# Patient Record
Sex: Female | Born: 1975 | Race: White | Hispanic: No | Marital: Single
Health system: Midwestern US, Community
[De-identification: ages and names within clinical notes are randomized; demographics above are authoritative.]

## PROBLEM LIST (undated history)

## (undated) DIAGNOSIS — E1143 Type 2 diabetes mellitus with diabetic autonomic (poly)neuropathy: Principal | ICD-10-CM

## (undated) DIAGNOSIS — E119 Type 2 diabetes mellitus without complications: Secondary | ICD-10-CM

## (undated) DIAGNOSIS — J45909 Unspecified asthma, uncomplicated: Secondary | ICD-10-CM

## (undated) DIAGNOSIS — F32A Depression, unspecified: Secondary | ICD-10-CM

## (undated) DIAGNOSIS — E669 Obesity, unspecified: Secondary | ICD-10-CM

## (undated) DIAGNOSIS — F329 Major depressive disorder, single episode, unspecified: Secondary | ICD-10-CM

## (undated) HISTORY — PX: BREAST SURGERY: SHX581

## (undated) MED FILL — METFORMIN HCL 1000MG TABS: 1000 MG | 30 days supply | Qty: 60 | Fill #0 | Status: AC

## (undated) MED FILL — AMLODIPINE BESYLATE 5MG TABS: 5 MG | 30 days supply | Qty: 30 | Fill #0 | Status: AC

---

## 2006-02-22 ENCOUNTER — Emergency Department: Payer: Self-pay | Admitting: General Practice

## 2006-03-27 ENCOUNTER — Emergency Department (HOSPITAL_COMMUNITY): Admission: EM | Admit: 2006-03-27 | Discharge: 2006-03-27 | Payer: Self-pay | Admitting: Emergency Medicine

## 2006-05-18 ENCOUNTER — Emergency Department: Payer: Self-pay | Admitting: Emergency Medicine

## 2006-05-18 ENCOUNTER — Other Ambulatory Visit: Payer: Self-pay

## 2006-05-20 ENCOUNTER — Emergency Department: Payer: Self-pay | Admitting: Emergency Medicine

## 2006-06-11 ENCOUNTER — Emergency Department: Payer: Self-pay | Admitting: Emergency Medicine

## 2006-07-29 ENCOUNTER — Emergency Department: Payer: Self-pay | Admitting: Emergency Medicine

## 2006-07-29 ENCOUNTER — Other Ambulatory Visit: Payer: Self-pay

## 2007-02-13 ENCOUNTER — Emergency Department: Payer: Self-pay | Admitting: Internal Medicine

## 2007-02-26 ENCOUNTER — Emergency Department: Payer: Self-pay | Admitting: Emergency Medicine

## 2007-03-06 ENCOUNTER — Emergency Department: Payer: Self-pay | Admitting: Emergency Medicine

## 2007-03-07 ENCOUNTER — Emergency Department (HOSPITAL_COMMUNITY): Admission: EM | Admit: 2007-03-07 | Discharge: 2007-03-07 | Payer: Self-pay | Admitting: Emergency Medicine

## 2007-03-17 ENCOUNTER — Emergency Department: Payer: Self-pay | Admitting: Emergency Medicine

## 2007-03-23 ENCOUNTER — Emergency Department: Payer: Self-pay | Admitting: Emergency Medicine

## 2007-06-02 ENCOUNTER — Emergency Department (HOSPITAL_COMMUNITY): Admission: EM | Admit: 2007-06-02 | Discharge: 2007-06-02 | Payer: Self-pay | Admitting: Family Medicine

## 2007-06-16 ENCOUNTER — Emergency Department (HOSPITAL_COMMUNITY): Admission: EM | Admit: 2007-06-16 | Discharge: 2007-06-16 | Payer: Self-pay | Admitting: Emergency Medicine

## 2007-06-23 ENCOUNTER — Emergency Department (HOSPITAL_COMMUNITY): Admission: EM | Admit: 2007-06-23 | Discharge: 2007-06-23 | Payer: Self-pay | Admitting: Emergency Medicine

## 2007-08-31 ENCOUNTER — Emergency Department: Payer: Self-pay | Admitting: Emergency Medicine

## 2007-09-24 ENCOUNTER — Emergency Department (HOSPITAL_COMMUNITY): Admission: EM | Admit: 2007-09-24 | Discharge: 2007-09-24 | Payer: Self-pay | Admitting: Emergency Medicine

## 2007-09-30 ENCOUNTER — Inpatient Hospital Stay (HOSPITAL_COMMUNITY): Admission: AD | Admit: 2007-09-30 | Discharge: 2007-09-30 | Payer: Self-pay | Admitting: Obstetrics & Gynecology

## 2007-10-07 ENCOUNTER — Emergency Department (HOSPITAL_COMMUNITY): Admission: EM | Admit: 2007-10-07 | Discharge: 2007-10-07 | Payer: Self-pay | Admitting: Emergency Medicine

## 2007-10-12 ENCOUNTER — Emergency Department (HOSPITAL_COMMUNITY): Admission: EM | Admit: 2007-10-12 | Discharge: 2007-10-12 | Payer: Self-pay | Admitting: Emergency Medicine

## 2007-10-14 ENCOUNTER — Emergency Department (HOSPITAL_COMMUNITY): Admission: EM | Admit: 2007-10-14 | Discharge: 2007-10-15 | Payer: Self-pay | Admitting: Emergency Medicine

## 2007-10-17 ENCOUNTER — Inpatient Hospital Stay (HOSPITAL_COMMUNITY): Admission: EM | Admit: 2007-10-17 | Discharge: 2007-10-22 | Payer: Self-pay | Admitting: Emergency Medicine

## 2007-10-23 ENCOUNTER — Emergency Department (HOSPITAL_COMMUNITY): Admission: EM | Admit: 2007-10-23 | Discharge: 2007-10-23 | Payer: Self-pay | Admitting: Emergency Medicine

## 2007-10-27 ENCOUNTER — Emergency Department (HOSPITAL_COMMUNITY): Admission: EM | Admit: 2007-10-27 | Discharge: 2007-10-27 | Payer: Self-pay | Admitting: Emergency Medicine

## 2007-11-12 ENCOUNTER — Emergency Department (HOSPITAL_COMMUNITY): Admission: EM | Admit: 2007-11-12 | Discharge: 2007-11-12 | Payer: Self-pay | Admitting: Emergency Medicine

## 2007-11-17 ENCOUNTER — Emergency Department (HOSPITAL_COMMUNITY): Admission: EM | Admit: 2007-11-17 | Discharge: 2007-11-17 | Payer: Self-pay | Admitting: Emergency Medicine

## 2007-11-20 ENCOUNTER — Emergency Department (HOSPITAL_COMMUNITY): Admission: EM | Admit: 2007-11-20 | Discharge: 2007-11-20 | Payer: Self-pay | Admitting: Emergency Medicine

## 2007-11-23 ENCOUNTER — Emergency Department (HOSPITAL_COMMUNITY): Admission: EM | Admit: 2007-11-23 | Discharge: 2007-11-23 | Payer: Self-pay | Admitting: Emergency Medicine

## 2007-11-30 ENCOUNTER — Emergency Department (HOSPITAL_COMMUNITY): Admission: EM | Admit: 2007-11-30 | Discharge: 2007-11-30 | Payer: Self-pay | Admitting: Emergency Medicine

## 2007-12-06 ENCOUNTER — Emergency Department (HOSPITAL_COMMUNITY): Admission: EM | Admit: 2007-12-06 | Discharge: 2007-12-06 | Payer: Self-pay | Admitting: Emergency Medicine

## 2007-12-15 ENCOUNTER — Emergency Department (HOSPITAL_COMMUNITY): Admission: EM | Admit: 2007-12-15 | Discharge: 2007-12-15 | Payer: Self-pay | Admitting: Emergency Medicine

## 2007-12-21 ENCOUNTER — Emergency Department (HOSPITAL_COMMUNITY): Admission: EM | Admit: 2007-12-21 | Discharge: 2007-12-21 | Payer: Self-pay | Admitting: Emergency Medicine

## 2007-12-29 ENCOUNTER — Emergency Department (HOSPITAL_COMMUNITY): Admission: EM | Admit: 2007-12-29 | Discharge: 2007-12-29 | Payer: Self-pay | Admitting: Emergency Medicine

## 2008-01-20 ENCOUNTER — Emergency Department (HOSPITAL_COMMUNITY): Admission: EM | Admit: 2008-01-20 | Discharge: 2008-01-20 | Payer: Self-pay | Admitting: Emergency Medicine

## 2008-02-06 ENCOUNTER — Emergency Department (HOSPITAL_COMMUNITY): Admission: EM | Admit: 2008-02-06 | Discharge: 2008-02-06 | Payer: Self-pay | Admitting: Emergency Medicine

## 2008-02-08 ENCOUNTER — Emergency Department (HOSPITAL_COMMUNITY): Admission: EM | Admit: 2008-02-08 | Discharge: 2008-02-09 | Payer: Self-pay | Admitting: Emergency Medicine

## 2008-06-14 ENCOUNTER — Emergency Department (HOSPITAL_COMMUNITY): Admission: EM | Admit: 2008-06-14 | Discharge: 2008-06-14 | Payer: Self-pay | Admitting: Emergency Medicine

## 2008-06-21 ENCOUNTER — Emergency Department (HOSPITAL_COMMUNITY): Admission: EM | Admit: 2008-06-21 | Discharge: 2008-06-21 | Payer: Self-pay | Admitting: Emergency Medicine

## 2008-08-07 ENCOUNTER — Emergency Department (HOSPITAL_COMMUNITY): Admission: EM | Admit: 2008-08-07 | Discharge: 2008-08-07 | Payer: Self-pay | Admitting: Emergency Medicine

## 2008-09-06 ENCOUNTER — Ambulatory Visit: Payer: Self-pay | Admitting: Psychiatry

## 2008-09-06 ENCOUNTER — Inpatient Hospital Stay (HOSPITAL_COMMUNITY): Admission: RE | Admit: 2008-09-06 | Discharge: 2008-09-11 | Payer: Self-pay | Admitting: Psychiatry

## 2009-02-07 IMAGING — CR DG CHEST 2V
2 series · 2 of 2 positions shown · non-contrast
Comparison: 06/16/07.

CLINICAL DATA: Weakness after donating plasma. 
 CHEST - 2 VIEW:

[w chest pa]
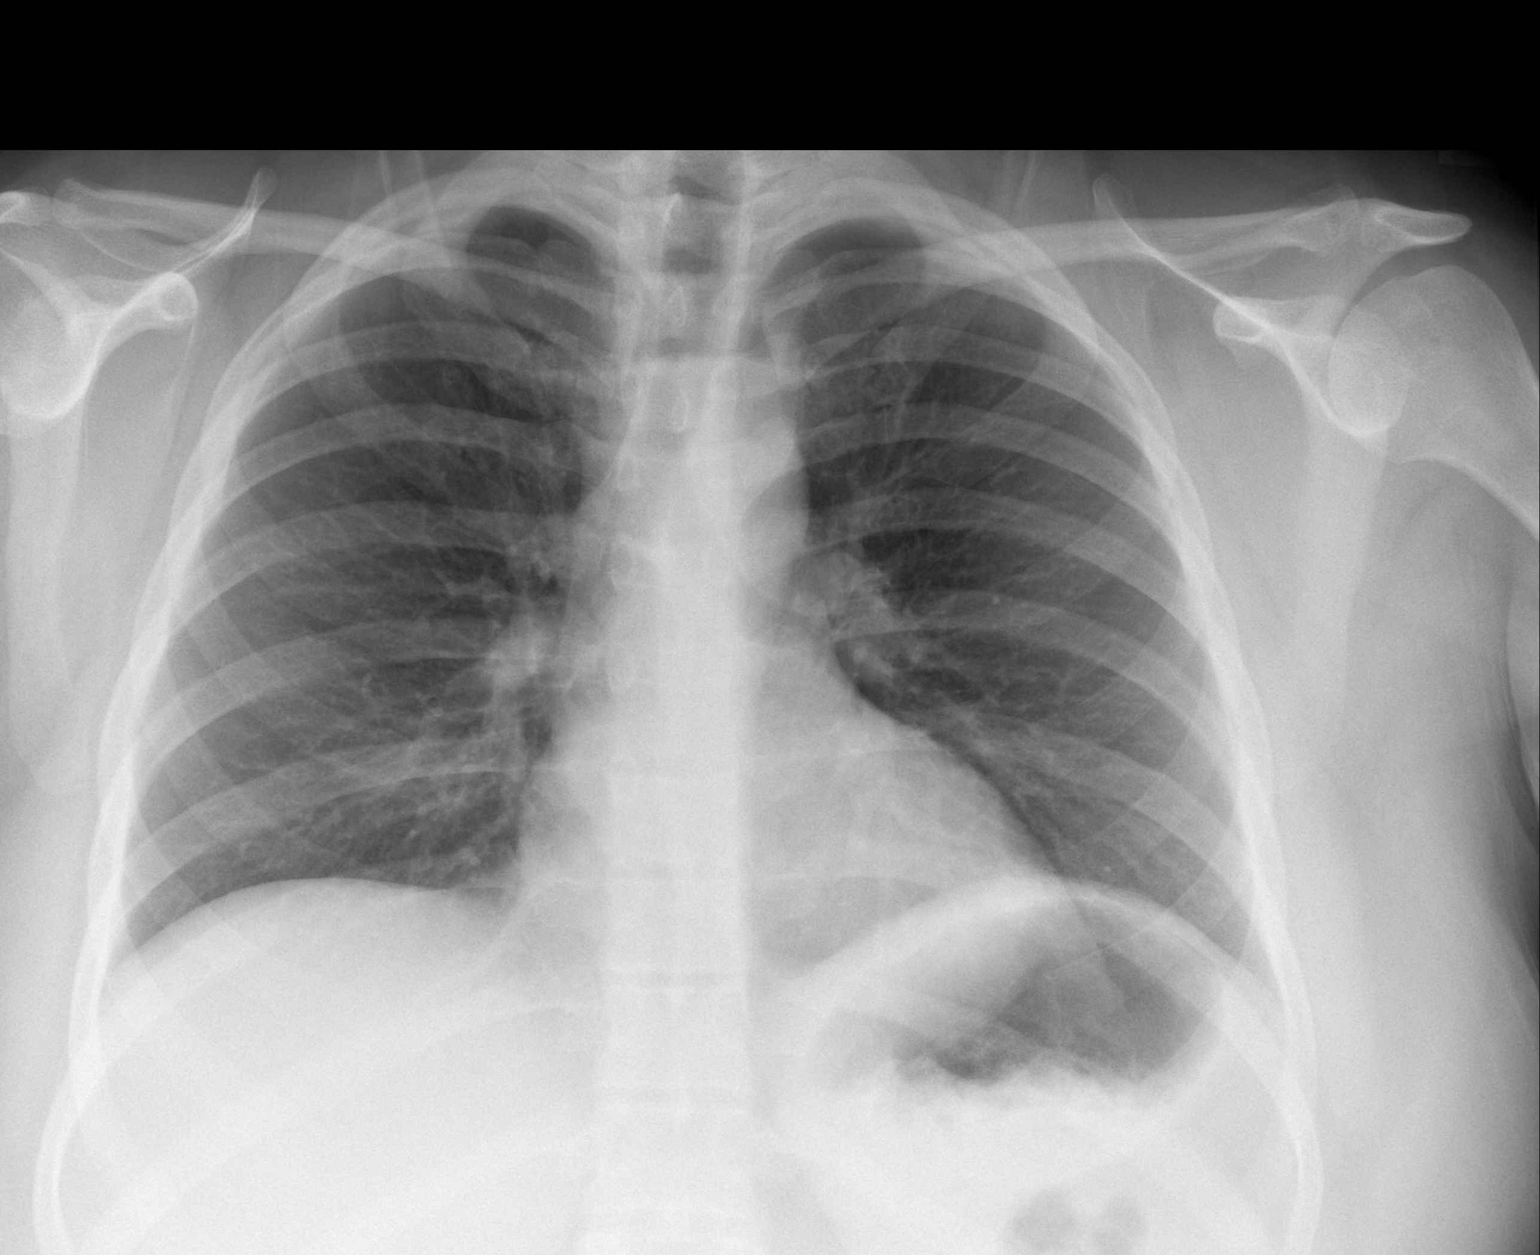

[w chest lat]
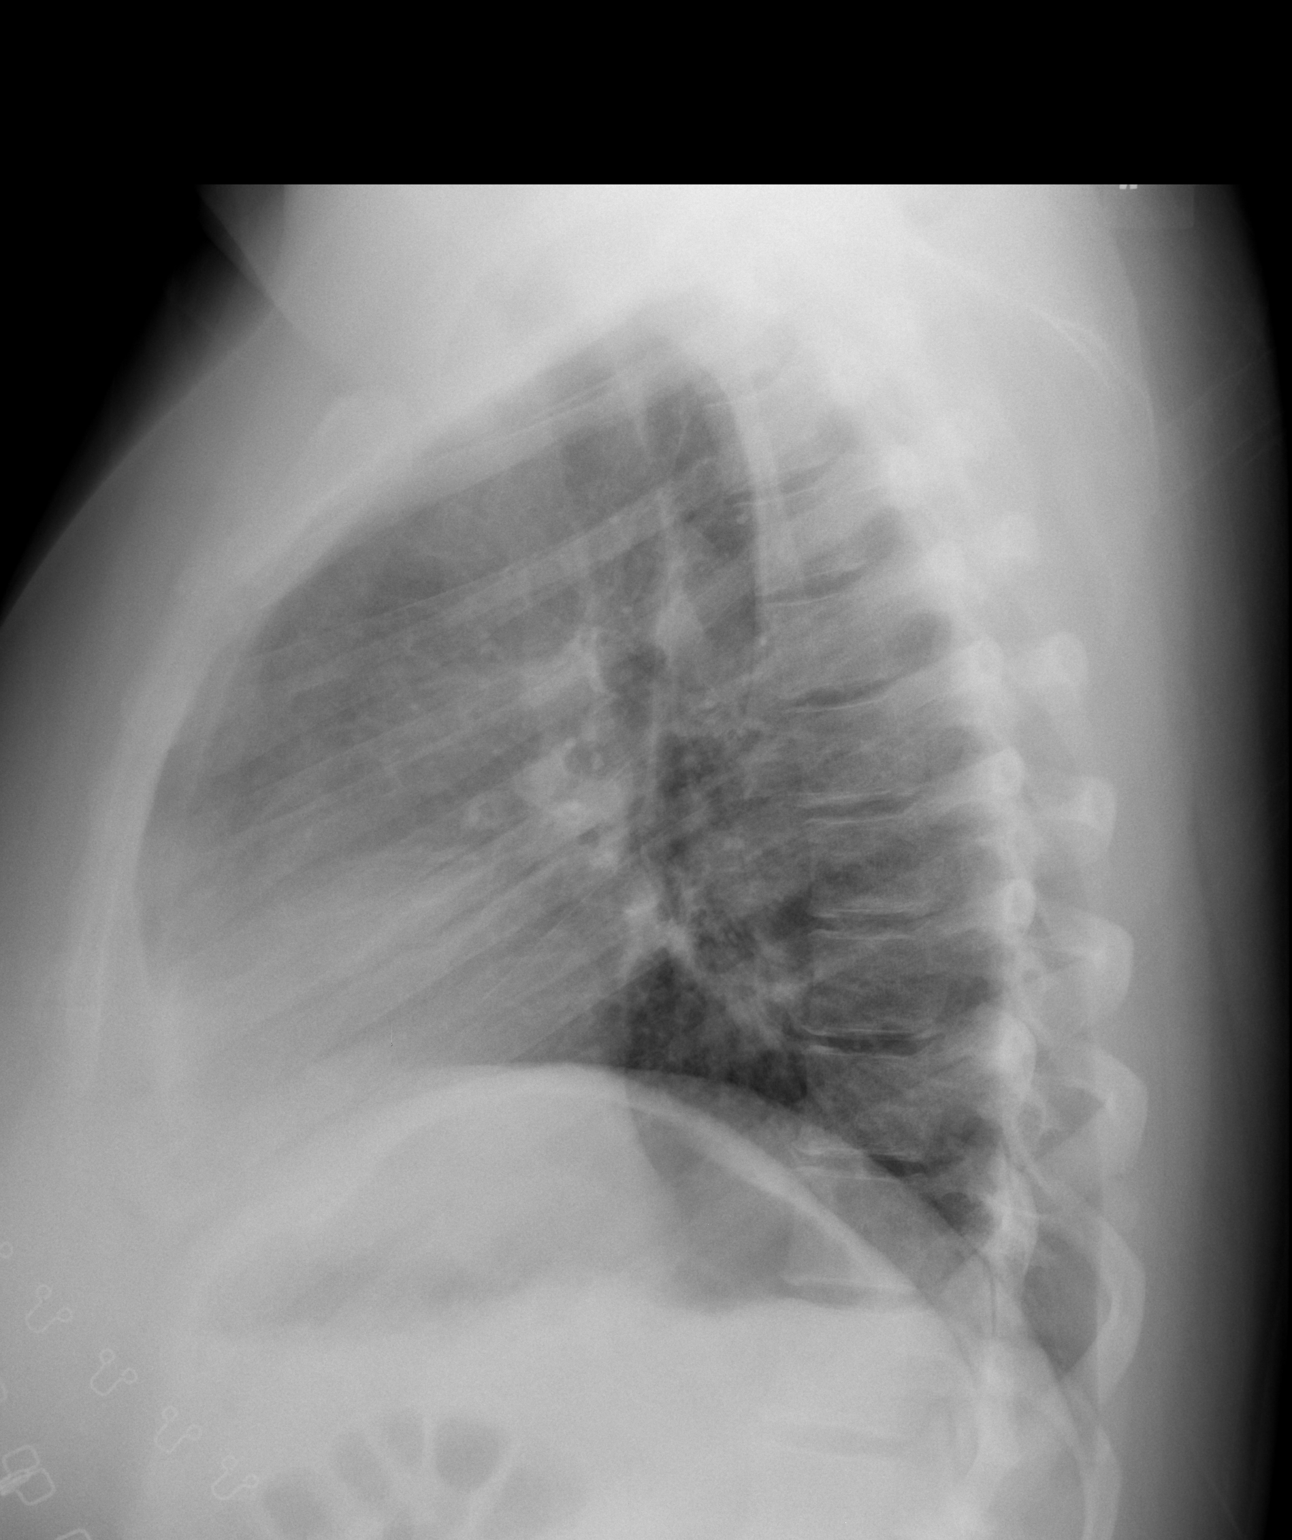

[2 of 2 positions shown; findings below may reference images not displayed]

FINDINGS: The heart size and mediastinal contours are within normal limits.  Both lungs are clear.  The visualized skeletal structures are unremarkable.  No change.
IMPRESSION: No active cardiopulmonary disease.

## 2009-02-10 IMAGING — CR DG CHEST 2V
2 series · 2 of 2 positions shown · non-contrast
Comparison: 10/12/07.

CLINICAL DATA: Dyspnea and productive cough.
 CHEST - 2 VIEW:

[w chest pa]
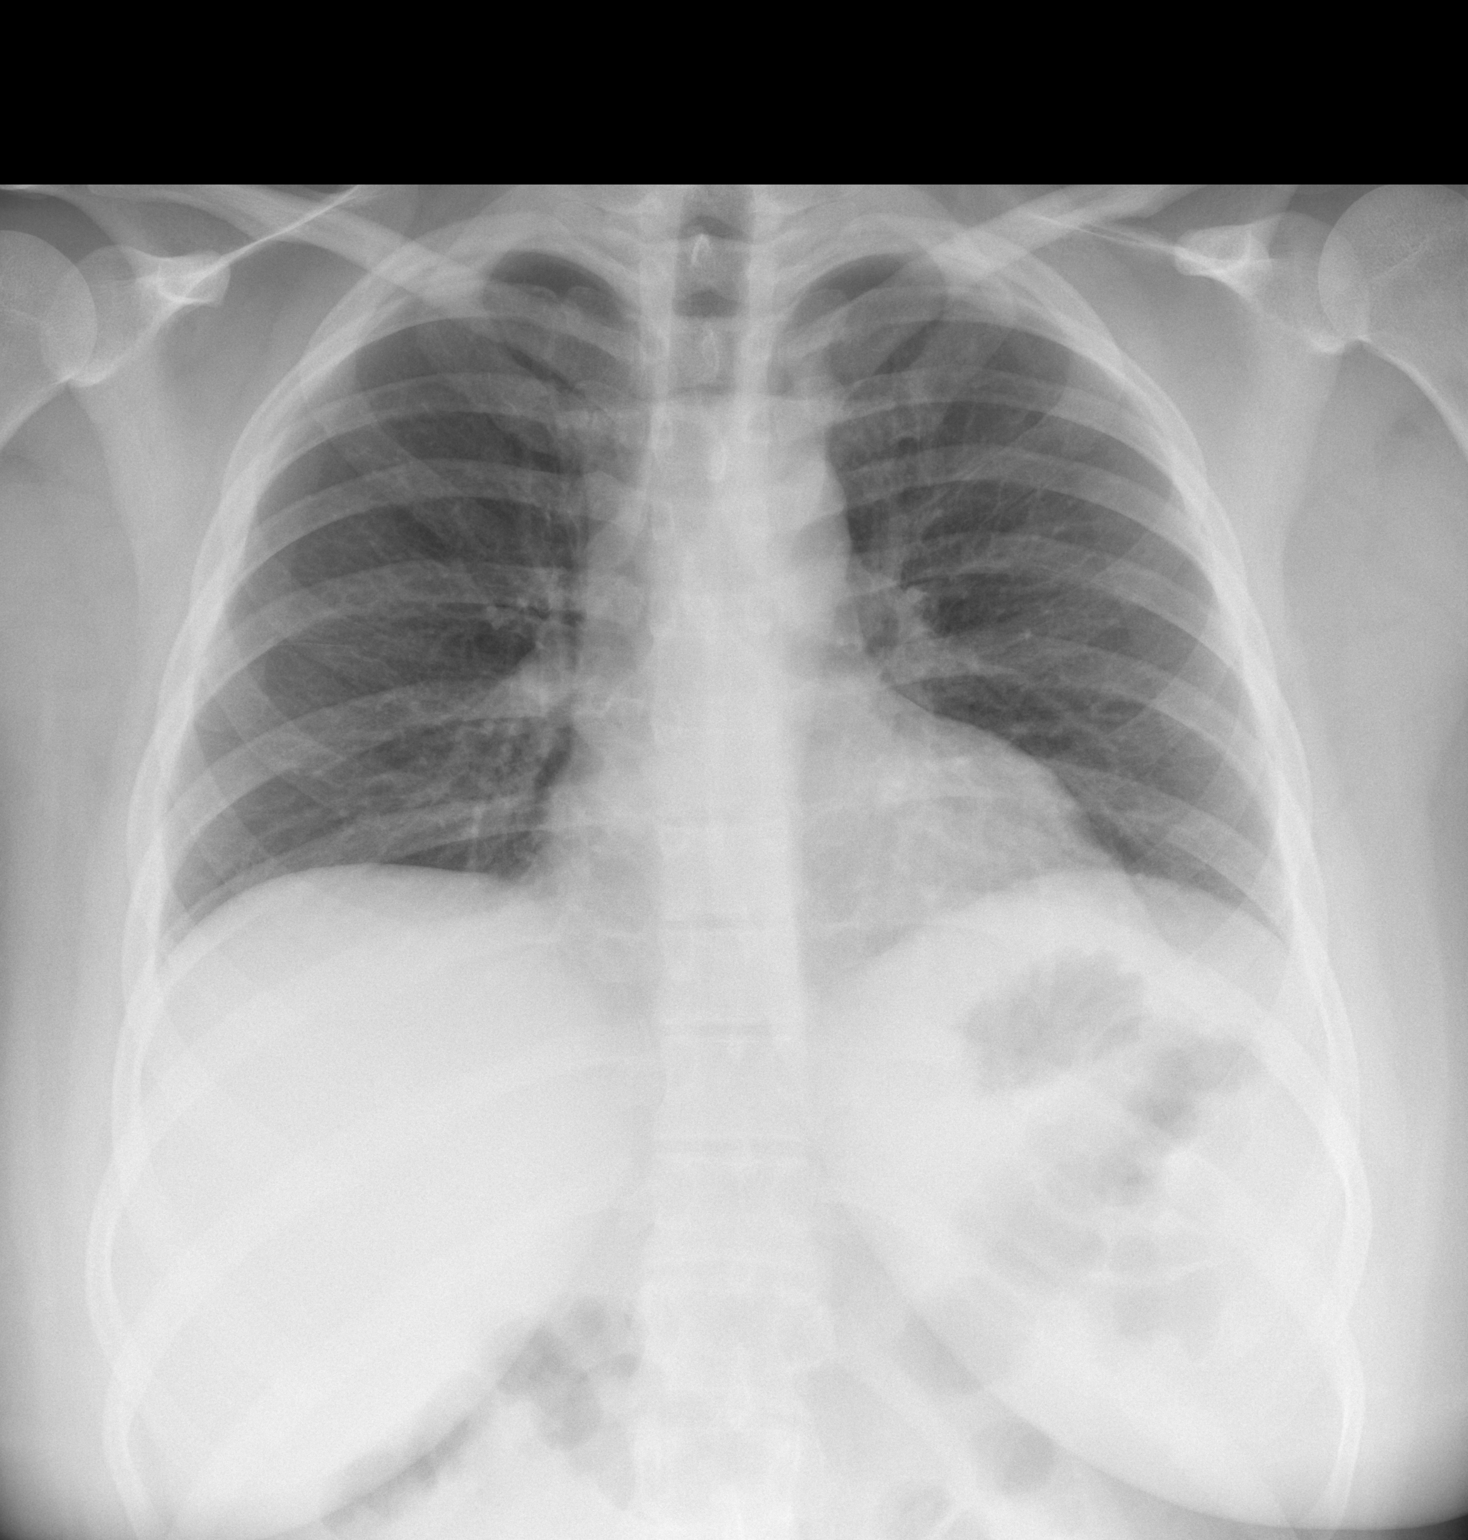

[w chest lat]
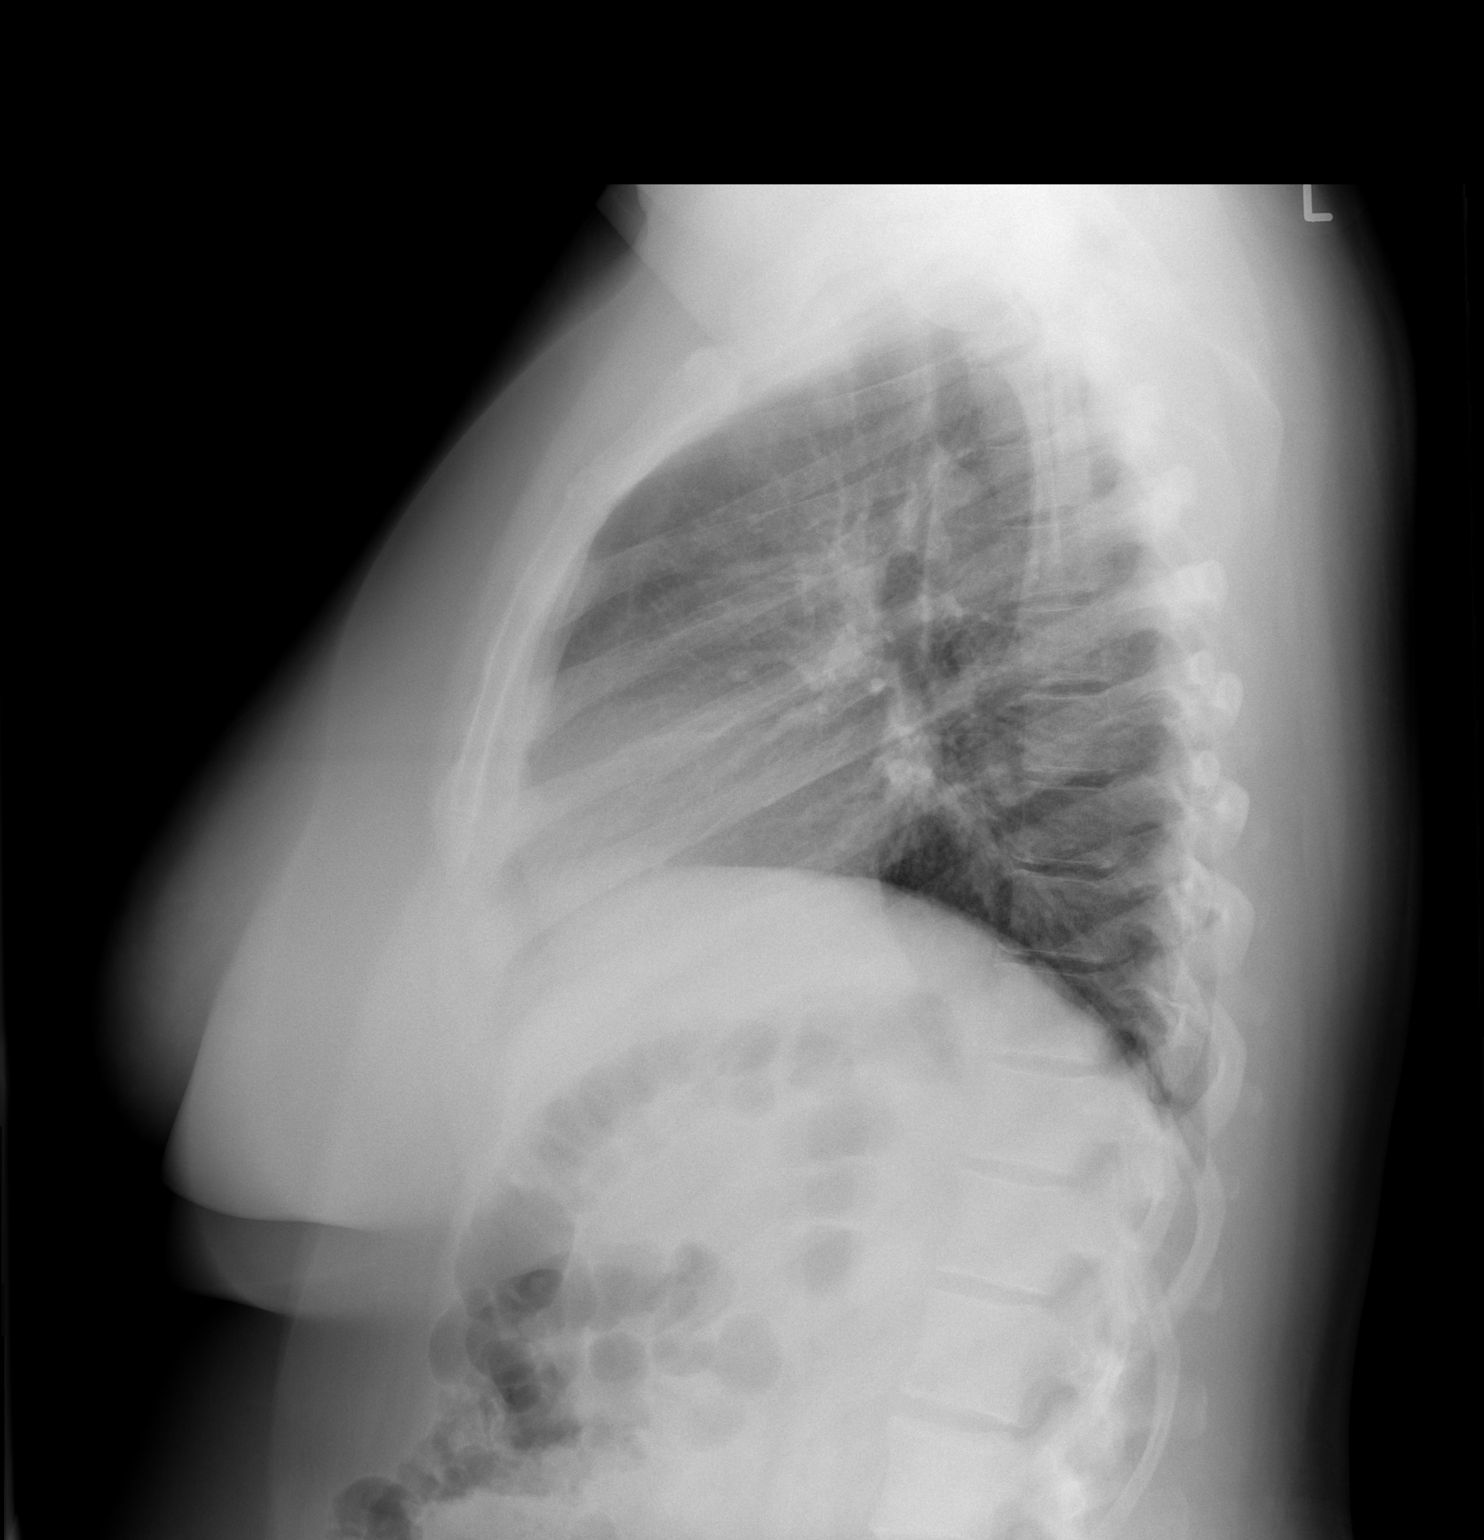

[2 of 2 positions shown; findings below may reference images not displayed]

FINDINGS: The heart size and mediastinal contours are stable.  There is mild central airway thickening without hyperinflation or confluent air space opacity.  There is no pleural effusion.
IMPRESSION: Stable examination with mild central airway thickening.  No evidence of pneumonia.

## 2009-02-15 IMAGING — CT CT ABDOMEN W/ CM
3 of 5 series · 13 of 36 positions shown, 19 images · IV contrast (OMNI 300/WATER)
Comparison: 09/30/2007

ABDOMEN CT WITH CONTRAST

CLINICAL DATA: , vomiting, right lower quadrant pain
TECHNIQUE: Multidetector CT imaging of the abdomen and pelvis was performed
following the standard protocol during bolus administration of intravenous
contrast.

Contrast:  100 cc Omnipaque 300

[Series 2: routine abdomen · axial · 0.75mm/px · z∈[-388,-18]mm · 8 of 96 slices shown, 13 images]
[im 11/96  soft-tissue]
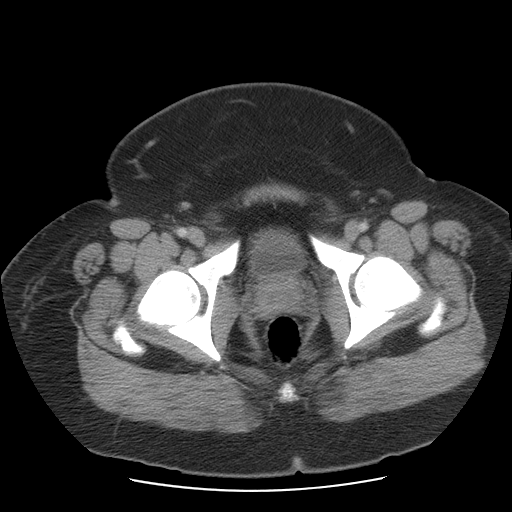
[im 11/96  bone]
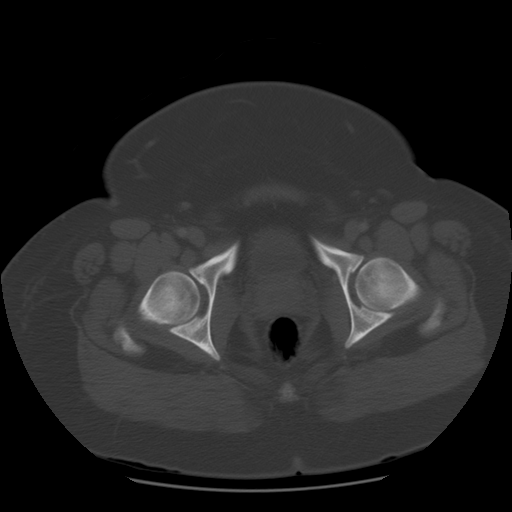
[im 22/96  soft-tissue]
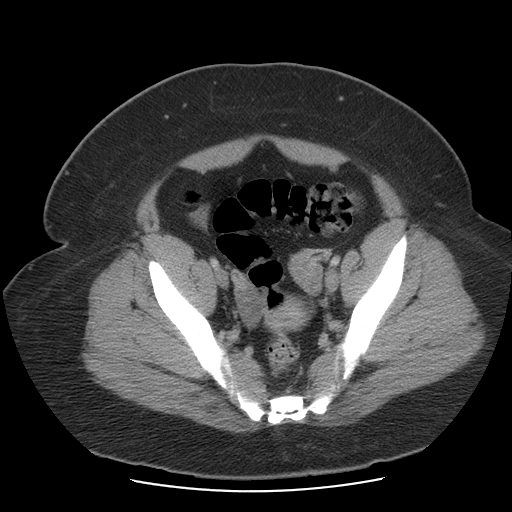
[im 32/96  soft-tissue]
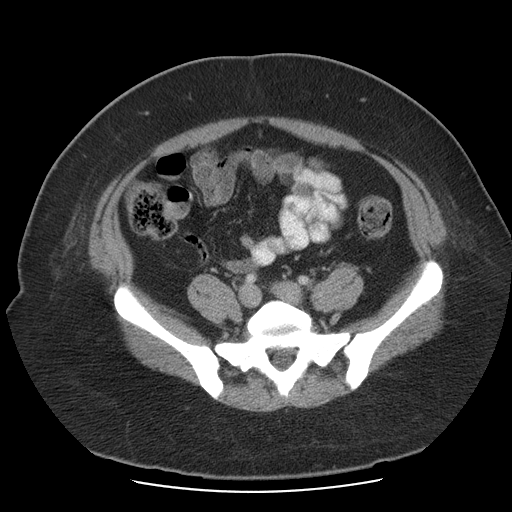
[im 43/96  soft-tissue]
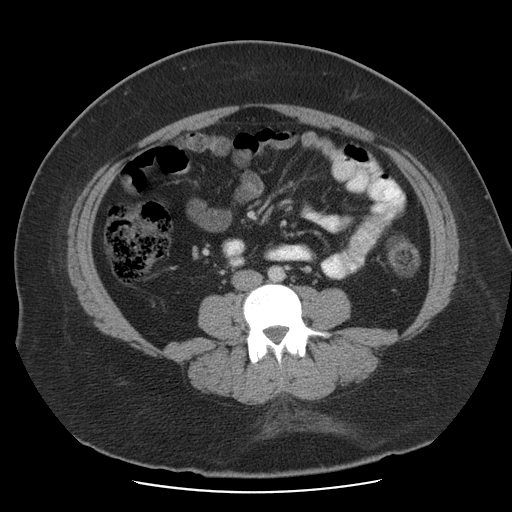
[im 53/96  soft-tissue]
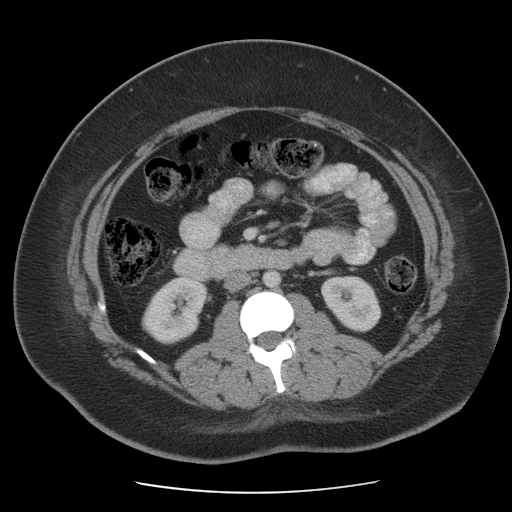
[im 53/96  lung]
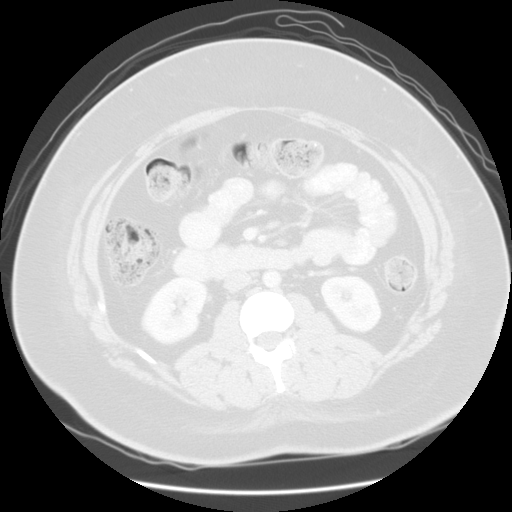
[im 64/96  soft-tissue]
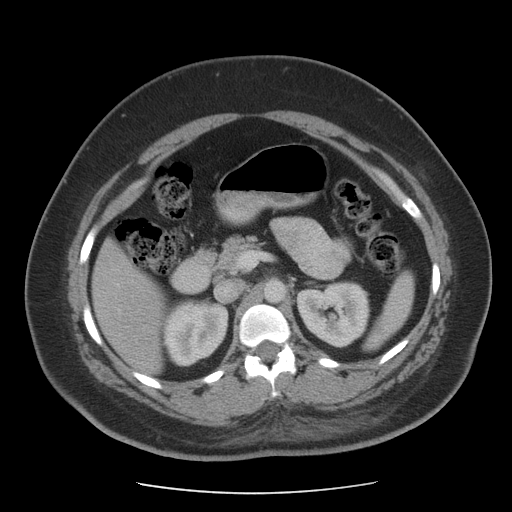
[im 64/96  lung]
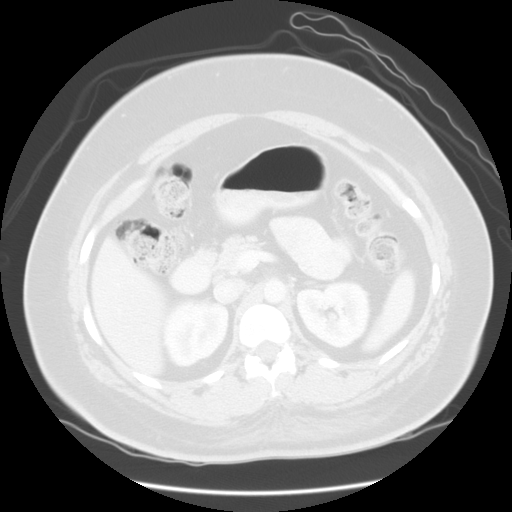
[im 74/96  soft-tissue]
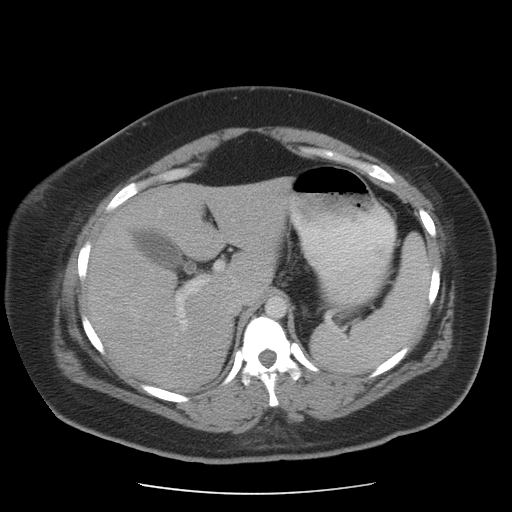
[im 74/96  lung]
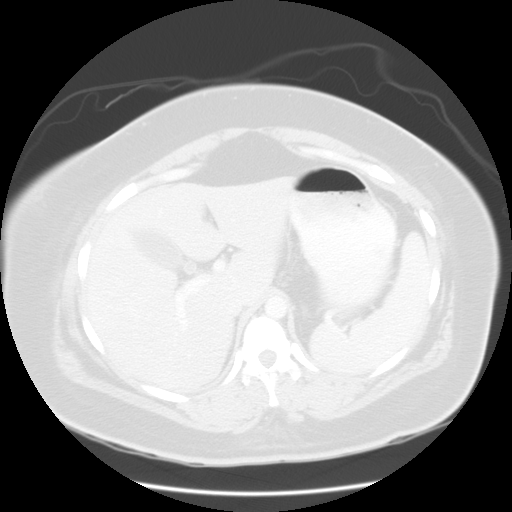
[im 85/96  soft-tissue]
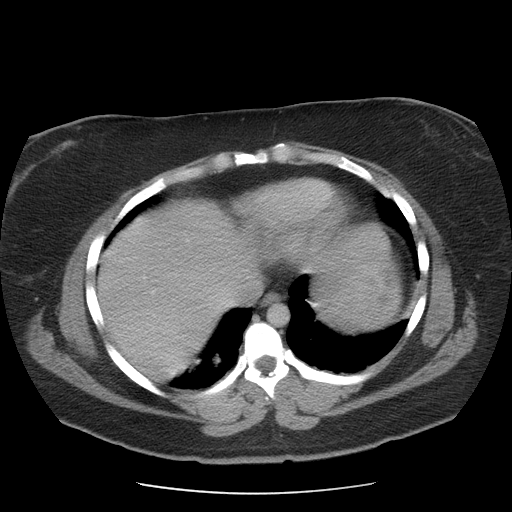
[im 85/96  lung]
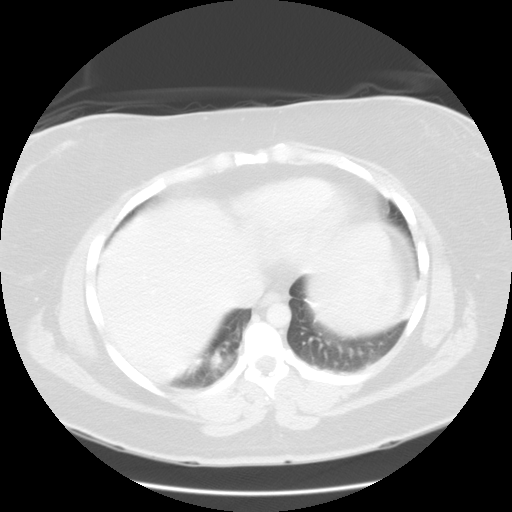

[Series 400: sag a/p · sagittal · 1.00mm/px · 1 of 185 slices shown, 2 images]
[im 62/185  soft-tissue]
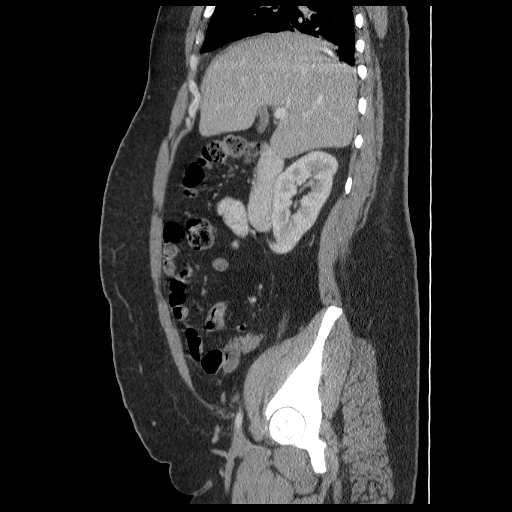
[im 62/185  bone]
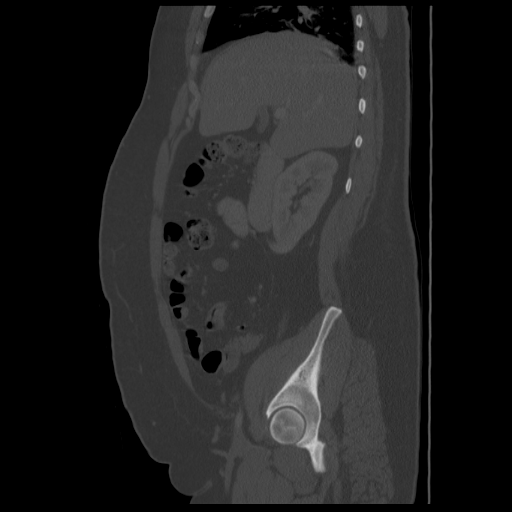

[Series 401: cor a/p · coronal · 1.00mm/px · 4 of 158 slices shown]
[im 11/158  soft-tissue]
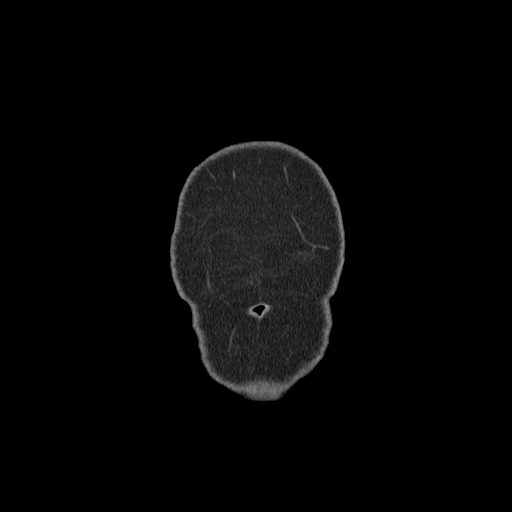
[im 32/158  soft-tissue]
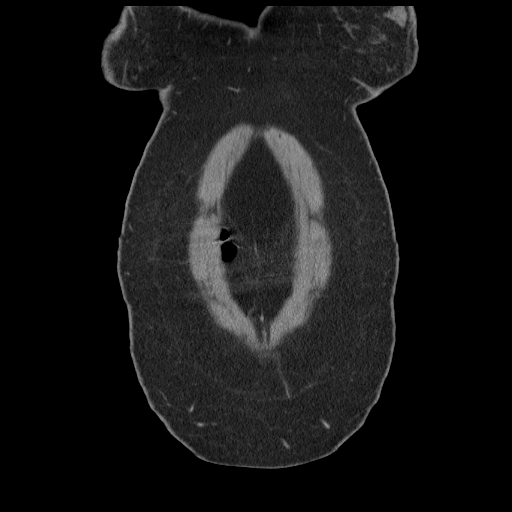
[im 53/158  soft-tissue]
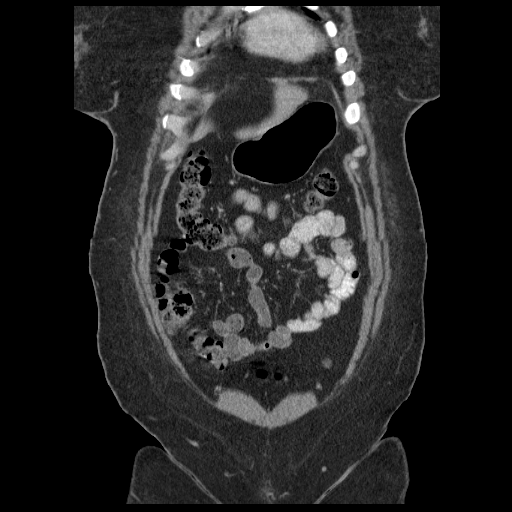
[im 74/158  soft-tissue]
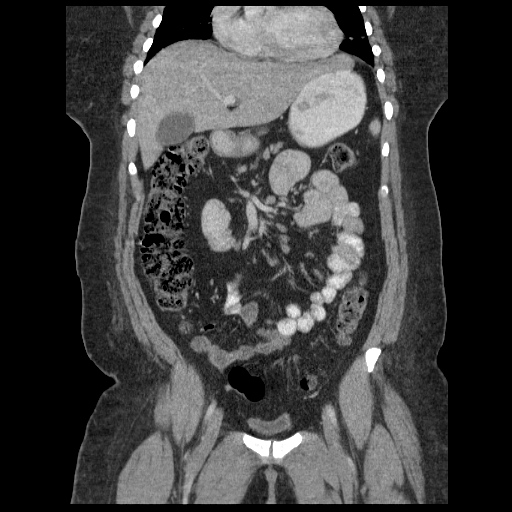

[13 of 36 positions shown; findings below may reference images not displayed]

FINDINGS: Areas of atelectasis in the lung bases bilaterally. Heart is
borderline in size. No effusions.

Liver, spleen, pancreas, adrenals, kidneys, gallbladder unremarkable. Large and
small bowel grossly unremarkable. No free fluid, free air, or adenopathy. No
acute bony abnormality.

IMPRESSION

Scattered bibasilar atelectasis.

No acute findings in the abdomen.

PELVIS CT WITH CONTRAST
FINDINGS: Appendix is visualized and is normal. Scattered small mesenteric
lymph nodes, none of which are pathologically enlarged. Uterus and adnexa
grossly unremarkable. No free fluid, free air, or adenopathy.

Bony structures unremarkable.

IMPRESSION

Appendix normal.

No acute findings in the pelvis.
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
## 2011-01-25 NOTE — Discharge Summary (Signed)
NAME:  Darlene Guzman, Darlene Guzman NO.:  [id number]   MEDICAL RECORD NO.:  [id number]          PATIENT TYPE:  INP   LOCATION:  3021                         FACILITY:  [facility]   PHYSICIAN:  Isidor Holts, M.D.  DATE OF BIRTH:  1975/12/12   DATE OF ADMISSION:  10/16/2007  DATE OF DISCHARGE:  10/22/2007                               DISCHARGE SUMMARY   DISCHARGE DIAGNOSES:  1. Acute bronchitis.  2. Acute gastroenteritis.  3. Possible inadvertent Azithromycin overdose.  4. Morbid obesity.   DISCHARGE MEDICATIONS:  Nasonex nasal spray, one each nostril twice  daily, p.r.n.   PROCEDURES:  1. Chest x-ray dated October 15, 2007.  This was a stable examination      with mild central airway thickening.  No evidence of pneumonia.  2. Abdominal/pelvic CT scan dated October 20, 2007.  This showed      scattered bibasilar atelectasis.  No acute findings in the abdomen.      Appendix normal.  No acute findings in the pelvis.   CONSULTATIONS:  None.   ADMISSION HISTORY:  As of H&P notes of October 16, 2007, dictated by Dr.  Della Goo.  However, in brief, this is a 35 year old female, with  past medical history significant only for breast reduction, who presents  with nausea, vomiting and diarrhea which started about 5:00 p.m.  Reportedly, the patient had been seen previously in the emergency  department, evaluated for bronchitis and administered Azithromycin 500  mg times one dose.  She was then prescribed four other pills, but the  patient reportedly went home and took all these other pills at one time  and now presents with above symptoms.  She was admitted for evaluation,  investigation and management.   CLINICAL COURSE:  1. Acute bronchitis.  For details of presentation, refer to admission      history above.  Patient's chest x-ray was unremarkable for focal      consolidation.  Because she had taken multiple Azithromycin tablets      the day before, considering the half  life of Azithromycin, she was      managed only with Mucinex until day #3 of hospitalization, them      commenced on oral Avelox, which she completed in the a.m. of      October 22, 2007.  Respiratory symptoms have considerably      ameliorated.  The patient, as of October 22, 2007, had only scant      phlegm.  No shortness of breath.  She had remained apyretial      throughout the course of her hospitalization.  Because of nasal      congestion on October 21, 2007, she was treated with Nasonex nasal      spray.   1. Acute gastroenteritis.  As mentioned above, the patient admittedly      took an overdose of aAzithromycin which may have exacerbated her GI      symptoms.  She presented with nausea and vomiting, was managed with      bowel rest, intravenous hydration and proton pump inhibitor.  However, her symptoms showed only tardy response, necessitating      symptomatic treatment with Loperamide and administration of anti-      emetic treatment.  Therefore, the patient may have had a viral      gastroenteritis, possibly related to her respiratory tract illness,      and that Azithromycin simply exacerbated this problem.  Be that as      it may, by October 21, 2007, the patient was completely      asymptomatic and in no acute distress, tolerated an advanced diet.      Because of diffuse abdominal pain on October 20, 2007, she had an      abdominal pelvic CT scan done, which was unremarkable for acute      pathology.  She has been reassured accordingly.   DISPOSITION:  The patient was on October 22, 2007, considered  sufficiently clinically recovered and stable to be discharged.  She was  therefore discharged accordingly.  She was recommended to return to  regular activities on October 23, 2007.   Activities:  No restrictions.  Diet:  No restrictions.   FOLLOWUP INSTRUCTIONS:  The patient does not have a primary M.D. at this  time.  She has been strongly urged to establish a  primary M.D. for  routine and preventative care.   SPECIAL INSTRUCTIONS:  The patient apparently has some issues with a  place of domicile, and says she sleeps with friends and relatives and  does not have a place of her own.  We have requeated the clinical social  worker to consult her and offer any recommendations which may be  appropriate.      Isidor Holts, M.D.  Electronically Signed     CO/MEDQ  D:  10/22/2007  T:  10/22/2007  Job:  [id number]
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
## 2011-01-25 NOTE — H&P (Signed)
NAME:  Darlene Guzman, Darlene Guzman NO.:  [id number]   MEDICAL RECORD NO.:  [id number]          PATIENT TYPE:  INP   LOCATION:  3021                         FACILITY:  [facility]   PHYSICIAN:  Della Goo, M.D. DATE OF BIRTH:  06/29/1976   DATE OF ADMISSION:  10/16/2007  DATE OF DISCHARGE:                              HISTORY & PHYSICAL   PRIMARY CARE PHYSICIAN:  Unassigned.   HISTORY OF PRESENT ILLNESS:  This is a 35 year old female who presents  to the emergency department with complaints of severe nausea, vomiting,  diarrhea.  This started at about 5:00 p.m. in the afternoon.  The  patient had been seen the day before in the emergency department and  evaluated for bronchitis and administered azithromycin 500 mg p.o. x 1  in the emergency department and then went home and the next day took all  four of the remaining antibiotic tablets because she misunderstood the  directions possibly.  The patient reports beginning to have nausea and  vomiting at about 5:00 p.m. and then beginning to have diarrhea and  reports that the symptoms would not secondary stop.  So she came back to  the emergency department.  The patient denies having any abdominal pain.  She does report having weakness.   PAST MEDICAL HISTORY:  None.   SURGICAL HISTORY:  History of a breast reduction.   MEDICATIONS:  Mentioned above.  No other regular medications. The  patient also had been prescribed an inhaler for the bronchitis as well.   SOCIAL HISTORY:  The patient is a nonsmoker, nondrinker.   ALLERGIES:  TO MORPHINE AND NOVOCAIN.   SOCIAL HISTORY:  The patient is a nonsmoker, nondrinker, and she denies  any illicit drug usage.   FAMILY HISTORY:  Noncontributory.   PHYSICAL EXAMINATION FINDINGS:  GENERAL:  This is a 35 year old morbidly  obese female in discomfort but no acute distress.  VITAL SIGNS: Temperature 98.5, blood pressure 123/82, heart rate 92 and  respirations 20, O2 saturation 99% on  room air.  HEENT: Examination normocephalic, atraumatic.  Pupils equal round and  reactive to light.  Extraocular muscles are intact. Funduscopic benign.  Oropharynx is clear.  NECK: Supple with full range of motion.  No thyromegaly, adenopathy, or  jugulovenous distention.  CARDIOVASCULAR:  Regular rate and rhythm.  No  murmurs, gallops or rubs.  LUNGS: Clear to auscultation bilaterally.  ABDOMEN: Positive bowel sounds, soft, nontender, nondistended.  EXTREMITIES: Without cyanosis, clubbing or edema.  NEUROLOGIC:  Alert and oriented.  Nonfocal.   LABORATORY STUDIES:  Complete blood count with a white blood cell count  of 11.3, hemoglobin 13.1, hematocrit 38.5, platelets 263, MCV 83.4,  neutrophils 89% lymphocytes 6%.  Chemistry with a sodium of 138,  potassium 3.6, chloride 106, CO2 of 24, BUN 18, creatinine 0.88, and  glucose 95. Albumin 3.5, calcium 8.8, total bilirubin 1, AST 22, ALT 21  and alkaline phosphatase 59.  Urinalysis negative.   ASSESSMENT:  A 35 year old female being admitted with:  1. Nausea, vomiting, diarrhea/acute gastroenteritis, most probably      secondary to the antibiotic therapy.  2. Mild dehydration.  3. Mild leukocytosis   PLAN:  The patient will be admitted for 23-hour observation which may,  pending her clinical course, be changed to a regular admission.  Intravenous fluids have been ordered for maintenance and hydration  therapy.  The patient will be placed on antiemetic therapy and Lomotil  for diarrhea symptoms.  Stool studies will be sent, a C and S and C.  diff study.  However this is really thought to be secondary to the  antibiotic therapy, which should resolve shortly.  Pain control therapy  will also be ordered as needed.  Further evaluation will ensue pending  the patient's clinical course.      Della Goo, M.D.  Electronically Signed     HJ/MEDQ  D:  10/17/2007  T:  10/18/2007  Job:  [id number]

## 2011-06-02 LAB — URINALYSIS, ROUTINE W REFLEX MICROSCOPIC
Bilirubin Urine: NEGATIVE
Hgb urine dipstick: NEGATIVE
Ketones, ur: NEGATIVE
Nitrite: NEGATIVE
Protein, ur: NEGATIVE
Specific Gravity, Urine: 1.02
Urobilinogen, UA: 0.2

## 2011-06-02 LAB — I-STAT 8, (EC8 V) (CONVERTED LAB)
Acid-Base Excess: 1
Acid-base deficit: 1
BUN: 14
BUN: 11
Bicarbonate: 24.5 — ABNORMAL HIGH
Bicarbonate: 27.7 — ABNORMAL HIGH
Chloride: 110
Chloride: 105
Glucose, Bld: 116 — ABNORMAL HIGH
Glucose, Bld: 98
HCT: 41
HCT: 48 — ABNORMAL HIGH
Hemoglobin: 13.9
Hemoglobin: 16.3 — ABNORMAL HIGH
Operator id: 146091
Operator id: 294511
Potassium: 4.1
Potassium: 4.3
Sodium: 140
Sodium: 138
TCO2: 26
TCO2: 29
pCO2, Ven: 45
pCO2, Ven: 51.4 — ABNORMAL HIGH
pH, Ven: 7.345 — ABNORMAL HIGH
pH, Ven: 7.339 — ABNORMAL HIGH

## 2011-06-02 LAB — WET PREP, GENITAL

## 2011-06-02 LAB — POCT I-STAT CREATININE
Creatinine, Ser: 0.9
Operator id: 146091

## 2011-06-02 LAB — HCG, QUANTITATIVE, PREGNANCY: hCG, Beta Chain, Quant, S: <2

## 2011-06-02 LAB — GC/CHLAMYDIA PROBE AMP, GENITAL
Chlamydia, DNA Probe: NEGATIVE
GC Probe Amp, Genital: NEGATIVE

## 2011-06-03 LAB — CBC
HCT: 38.3
HCT: 35.2 — ABNORMAL LOW
HCT: 32.6 — ABNORMAL LOW
HCT: 33.6 — ABNORMAL LOW
Hemoglobin: 11.4 — ABNORMAL LOW
MCHC: 34.7
MCHC: 34.6
MCHC: 34
MCHC: 34
MCV: 82.8
MCV: 81.6
MCV: 83.4
Platelets: 214
Platelets: 251
Platelets: 251
Platelets: 263
RBC: 3.82 — ABNORMAL LOW
RDW: 13.8
RDW: 14.5
RDW: 14.8
RDW: 15.1
WBC: 8.4
WBC: 4

## 2011-06-03 LAB — BASIC METABOLIC PANEL
BUN: 4 — ABNORMAL LOW
CO2: 26
CO2: 27
Calcium: 7.7 — ABNORMAL LOW
Chloride: 106
Chloride: 108
Chloride: 109
Creatinine, Ser: 0.81
GFR calc Af Amer: >60
GFR calc Af Amer: >60
GFR calc non Af Amer: >60
GFR calc non Af Amer: >60
Glucose, Bld: 104 — ABNORMAL HIGH
Glucose, Bld: 125 — ABNORMAL HIGH
Potassium: 3.5
Potassium: 3.6
Sodium: 139
Sodium: 142

## 2011-06-03 LAB — URINALYSIS, ROUTINE W REFLEX MICROSCOPIC
Bilirubin Urine: NEGATIVE
Bilirubin Urine: NEGATIVE
Ketones, ur: NEGATIVE
Nitrite: NEGATIVE
Nitrite: NEGATIVE
Protein, ur: NEGATIVE
Specific Gravity, Urine: 1.017
Specific Gravity, Urine: 1.026
Urobilinogen, UA: 0.2
Urobilinogen, UA: 0.2

## 2011-06-03 LAB — COMPREHENSIVE METABOLIC PANEL
ALT: 26
ALT: 21
AST: 22
Albumin: 3.5
Alkaline Phosphatase: 77
CO2: 26
Calcium: 8.8
Chloride: 102
Creatinine, Ser: 0.88
GFR calc Af Amer: >60
GFR calc Af Amer: >60
GFR calc non Af Amer: >60
Sodium: 138
Total Protein: 7.1
Total Protein: 6.1

## 2011-06-03 LAB — DIFFERENTIAL
Basophils Absolute: 0
Basophils Relative: 0
Eosinophils Absolute: 0
Eosinophils Absolute: 0
Eosinophils Relative: 0
Eosinophils Relative: 1
Eosinophils Relative: 0
Lymphocytes Relative: 6 — ABNORMAL LOW
Lymphs Abs: 0.7
Monocytes Absolute: 0.8
Monocytes Absolute: 0.5
Monocytes Relative: 9
Monocytes Relative: 4
Neutrophils Relative %: 81 — ABNORMAL HIGH

## 2011-06-03 LAB — CLOSTRIDIUM DIFFICILE EIA: C difficile Toxins A+B, EIA: NEGATIVE

## 2011-06-03 LAB — STOOL CULTURE

## 2011-06-03 LAB — POCT PREGNANCY, URINE
Operator id: 26329
Operator id: 285841
Preg Test, Ur: NEGATIVE

## 2011-06-03 LAB — POCT I-STAT 3, ART BLOOD GAS (G3+)
O2 Saturation: 96
TCO2: 24
pCO2 arterial: 36.1
pH, Arterial: 7.409 — ABNORMAL HIGH
pO2, Arterial: 78 — ABNORMAL LOW

## 2011-06-03 LAB — D-DIMER, QUANTITATIVE: D-Dimer, Quant: 0.33

## 2011-06-03 LAB — URINE CULTURE

## 2011-06-03 LAB — WET PREP, GENITAL
Clue Cells Wet Prep HPF POC: NONE SEEN
Trich, Wet Prep: NONE SEEN

## 2011-06-03 LAB — LIPASE, BLOOD: Lipase: 27

## 2011-06-06 LAB — CBC
MCHC: 34.8
MCV: 81.9
Platelets: 239
RDW: 14.6

## 2011-06-06 LAB — DIFFERENTIAL
Basophils Relative: 0
Eosinophils Absolute: 0.3
Eosinophils Relative: 4
Neutrophils Relative %: 66

## 2011-06-06 LAB — BASIC METABOLIC PANEL
BUN: 7
CO2: 25
Chloride: 105
Creatinine, Ser: 0.78
Glucose, Bld: 147 — ABNORMAL HIGH

## 2011-06-06 LAB — D-DIMER, QUANTITATIVE: D-Dimer, Quant: 0.44

## 2011-06-08 LAB — URINALYSIS, ROUTINE W REFLEX MICROSCOPIC
Bilirubin Urine: NEGATIVE
Glucose, UA: NEGATIVE
Hgb urine dipstick: NEGATIVE
Ketones, ur: NEGATIVE
Nitrite: NEGATIVE
Protein, ur: NEGATIVE
Specific Gravity, Urine: 1.036 — ABNORMAL HIGH
Urobilinogen, UA: 0.2
pH: 6

## 2011-06-08 LAB — CBC
HCT: 38.4
Hemoglobin: 13.2
MCHC: 34.3
MCV: 83.8
Platelets: 340
RBC: 4.57
RDW: 14
WBC: 10.9 — ABNORMAL HIGH

## 2011-06-08 LAB — POCT I-STAT, CHEM 8
BUN: 15
Calcium, Ion: 1.17
Chloride: 104
Creatinine, Ser: 1
Glucose, Bld: 117 — ABNORMAL HIGH
HCT: 38
Hemoglobin: 12.9
Potassium: 4
Sodium: 138
TCO2: 27

## 2011-06-08 LAB — DIFFERENTIAL
Basophils Absolute: 0
Basophils Relative: 0
Eosinophils Absolute: 0.1
Eosinophils Relative: 1
Lymphocytes Relative: 28
Lymphs Abs: 3
Monocytes Absolute: 0.9
Monocytes Relative: 8
Neutro Abs: 6.9
Neutrophils Relative %: 63

## 2011-06-08 LAB — PREGNANCY, URINE: Preg Test, Ur: NEGATIVE

## 2011-06-08 LAB — D-DIMER, QUANTITATIVE: D-Dimer, Quant: 0.25

## 2011-06-13 LAB — CBC
HCT: 35.3 — ABNORMAL LOW
Hemoglobin: 11.7 — ABNORMAL LOW
MCHC: 33.3
MCV: 81.4
Platelets: 252
RBC: 4.33
RDW: 15.2
WBC: 8.2

## 2011-06-13 LAB — COMPREHENSIVE METABOLIC PANEL WITH GFR
AST: 21
Albumin: 3.7
Alkaline Phosphatase: 66
CO2: 28
Chloride: 104
Creatinine, Ser: 0.74
GFR calc Af Amer: >60
GFR calc non Af Amer: >60
Potassium: 4.1
Total Bilirubin: 0.9

## 2011-06-13 LAB — URINALYSIS, ROUTINE W REFLEX MICROSCOPIC
Bilirubin Urine: NEGATIVE
Glucose, UA: NEGATIVE
Hgb urine dipstick: NEGATIVE
Ketones, ur: NEGATIVE
Nitrite: NEGATIVE
Protein, ur: NEGATIVE
Specific Gravity, Urine: 1.034 — ABNORMAL HIGH
Urobilinogen, UA: 0.2
pH: 5.5

## 2011-06-13 LAB — COMPREHENSIVE METABOLIC PANEL
ALT: 21
BUN: 13
Calcium: 9.2
Glucose, Bld: 93
Sodium: 138
Total Protein: 6.6

## 2011-06-13 LAB — POCT CARDIAC MARKERS
CKMB, poc: 1.4
Myoglobin, poc: 58.8
Troponin i, poc: <0.05

## 2011-06-13 LAB — DIFFERENTIAL
Basophils Absolute: 0
Basophils Relative: 1
Eosinophils Absolute: 0.1
Eosinophils Relative: 1
Lymphocytes Relative: 21
Lymphs Abs: 1.8
Monocytes Absolute: 0.6
Monocytes Relative: 7
Neutro Abs: 5.8
Neutrophils Relative %: 70

## 2011-06-13 LAB — GLUCOSE, CAPILLARY: Glucose-Capillary: 168 — ABNORMAL HIGH

## 2011-06-13 LAB — LIPASE, BLOOD: Lipase: 38

## 2011-06-17 LAB — COMPREHENSIVE METABOLIC PANEL
ALT: 20 U/L (ref 0–35)
AST: 17 U/L (ref 0–37)
Calcium: 9.2 mg/dL (ref 8.4–10.5)
GFR calc Af Amer: >60 mL/min (ref >60)
Sodium: 141 mEq/L (ref 135–145)
Total Protein: 6.1 g/dL (ref 6.0–8.3)

## 2011-06-17 LAB — CBC
MCHC: 33.6 g/dL (ref 30.0–36.0)
RDW: 15 % (ref 11.5–15.5)

## 2011-06-17 LAB — RPR: RPR Ser Ql: NONREACTIVE

## 2011-06-17 LAB — HIV ANTIBODY (ROUTINE TESTING W REFLEX): HIV: NONREACTIVE

## 2011-06-23 LAB — I-STAT 8, (EC8 V) (CONVERTED LAB)
Acid-base deficit: 1
Bicarbonate: 25.7 — ABNORMAL HIGH
Glucose, Bld: 98
Glucose, Bld: 85
HCT: 34 — ABNORMAL LOW
Hemoglobin: 11.6 — ABNORMAL LOW
Operator id: 196461
Potassium: 3.4 — ABNORMAL LOW
Potassium: 4.1
Sodium: 138
TCO2: 27
TCO2: 27
pCO2, Ven: 46.1
pH, Ven: 7.347 — ABNORMAL HIGH

## 2011-06-23 LAB — URINALYSIS, ROUTINE W REFLEX MICROSCOPIC
Bilirubin Urine: NEGATIVE
Glucose, UA: NEGATIVE
Hgb urine dipstick: NEGATIVE
Ketones, ur: NEGATIVE
Specific Gravity, Urine: 1.017
pH: 5.5

## 2011-06-23 LAB — DIFFERENTIAL
Eosinophils Absolute: 0.1
Lymphocytes Relative: 30
Lymphs Abs: 2.2
Monocytes Relative: 8
Neutrophils Relative %: 61

## 2011-06-23 LAB — POCT CARDIAC MARKERS
CKMB, poc: <1 — ABNORMAL LOW
Operator id: 196461
Operator id: 151321
Troponin i, poc: <0.05

## 2011-06-23 LAB — POCT PREGNANCY, URINE: Preg Test, Ur: NEGATIVE

## 2011-06-23 LAB — CBC
HCT: 33.8 — ABNORMAL LOW
MCV: 86
RBC: 3.94
WBC: 7.4

## 2011-06-23 LAB — POCT I-STAT CREATININE
Creatinine, Ser: 0.9
Operator id: 196461
Operator id: 151321

## 2011-06-23 LAB — D-DIMER, QUANTITATIVE: D-Dimer, Quant: <0.22

## 2013-05-22 ENCOUNTER — Encounter (HOSPITAL_COMMUNITY): Payer: Self-pay | Admitting: Family Medicine

## 2013-05-22 ENCOUNTER — Encounter (HOSPITAL_COMMUNITY): Payer: Self-pay | Admitting: *Deleted

## 2013-05-22 ENCOUNTER — Emergency Department (HOSPITAL_COMMUNITY)
Admission: EM | Admit: 2013-05-22 | Discharge: 2013-05-22 | Disposition: A | Discharge status: Home / Self Care | Payer: Medicare Other | Attending: Emergency Medicine | Admitting: Emergency Medicine

## 2013-05-22 ENCOUNTER — Emergency Department (INDEPENDENT_AMBULATORY_CARE_PROVIDER_SITE_OTHER)
Admission: EM | Admit: 2013-05-22 | Discharge: 2013-05-22 | Disposition: A | Discharge status: Nursing Home (Includes ICF) | Payer: Self-pay | Source: Home / Self Care | Attending: Emergency Medicine | Admitting: Emergency Medicine

## 2013-05-22 DIAGNOSIS — S8990XA Unspecified injury of unspecified lower leg, initial encounter: Secondary | ICD-10-CM | POA: Insufficient documentation

## 2013-05-22 DIAGNOSIS — R110 Nausea: Secondary | ICD-10-CM | POA: Insufficient documentation

## 2013-05-22 DIAGNOSIS — R42 Dizziness and giddiness: Secondary | ICD-10-CM | POA: Insufficient documentation

## 2013-05-22 DIAGNOSIS — Y92410 Unspecified street and highway as the place of occurrence of the external cause: Secondary | ICD-10-CM | POA: Insufficient documentation

## 2013-05-22 DIAGNOSIS — IMO0002 Reserved for concepts with insufficient information to code with codable children: Secondary | ICD-10-CM | POA: Insufficient documentation

## 2013-05-22 DIAGNOSIS — S40029A Contusion of unspecified upper arm, initial encounter: Secondary | ICD-10-CM | POA: Insufficient documentation

## 2013-05-22 DIAGNOSIS — Y9389 Activity, other specified: Secondary | ICD-10-CM | POA: Insufficient documentation

## 2013-05-22 DIAGNOSIS — T07XXXA Unspecified multiple injuries, initial encounter: Secondary | ICD-10-CM

## 2013-05-22 MED ORDER — HYDROCODONE-ACETAMINOPHEN 5-325 MG PO TABS
2.0000 | ORAL_TABLET | Freq: Once | ORAL | Status: AC
  Administered 2013-05-22: 2 via ORAL

## 2013-05-22 MED ORDER — ONDANSETRON HCL 4 MG/2ML IJ SOLN: 4.0000 mg | Freq: Once | INTRAMUSCULAR | Status: DC

## 2013-05-22 MED ORDER — HYDROCODONE-ACETAMINOPHEN 5-325 MG PO TABS: 1.0000 | ORAL_TABLET | Freq: Four times a day (QID) | ORAL | Status: DC | PRN

## 2013-05-22 MED ORDER — NAPROXEN 500 MG PO TABS: 500.0000 mg | ORAL_TABLET | Freq: Two times a day (BID) | ORAL | Status: DC

## 2013-05-22 MED ORDER — HYDROMORPHONE HCL PF 1 MG/ML IJ SOLN: 2.0000 mg | Freq: Once | INTRAMUSCULAR | Status: DC

## 2013-05-22 MED ORDER — METHOCARBAMOL 500 MG PO TABS: 500.0000 mg | ORAL_TABLET | Freq: Two times a day (BID) | ORAL | Status: DC

## 2013-05-22 MED ORDER — HYDROCODONE-ACETAMINOPHEN 5-325 MG PO TABS
ORAL_TABLET | ORAL | Status: AC
  Filled 2013-05-22: qty 2
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
## 2013-05-24 ENCOUNTER — Emergency Department (HOSPITAL_COMMUNITY): Payer: Medicare Other

## 2013-05-24 ENCOUNTER — Encounter (HOSPITAL_COMMUNITY): Payer: Self-pay | Admitting: Emergency Medicine

## 2013-05-24 ENCOUNTER — Emergency Department (HOSPITAL_COMMUNITY)
Admission: EM | Admit: 2013-05-24 | Discharge: 2013-05-25 | Disposition: A | Discharge status: Home / Self Care | Payer: Medicare Other | Attending: Emergency Medicine | Admitting: Emergency Medicine

## 2013-05-24 DIAGNOSIS — Y92410 Unspecified street and highway as the place of occurrence of the external cause: Secondary | ICD-10-CM | POA: Insufficient documentation

## 2013-05-24 DIAGNOSIS — Z79899 Other long term (current) drug therapy: Secondary | ICD-10-CM | POA: Insufficient documentation

## 2013-05-24 DIAGNOSIS — IMO0002 Reserved for concepts with insufficient information to code with codable children: Secondary | ICD-10-CM | POA: Insufficient documentation

## 2013-05-24 DIAGNOSIS — Y9389 Activity, other specified: Secondary | ICD-10-CM | POA: Insufficient documentation

## 2013-05-24 MED ORDER — DIAZEPAM 5 MG PO TABS
5.0000 mg | ORAL_TABLET | Freq: Once | ORAL | Status: AC
  Administered 2013-05-24: 5 mg via ORAL
  Filled 2013-05-24: qty 1
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
## 2013-05-24 NOTE — ED Provider Notes (Signed)
Medical screening examination/treatment/procedure(s) were performed by non-physician practitioner and as supervising physician I was immediately available for consultation/collaboration.    Vida Roller, MD 05/24/13 [phone]
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
## 2013-05-25 MED ORDER — OXYCODONE-ACETAMINOPHEN 5-325 MG PO TABS: 1.0000 | ORAL_TABLET | Freq: Four times a day (QID) | ORAL | Status: DC | PRN

## 2013-05-25 MED ORDER — CYCLOBENZAPRINE HCL 10 MG PO TABS
10.0000 mg | ORAL_TABLET | Freq: Two times a day (BID) | ORAL | Status: DC | PRN
Start: 2013-05-25 — End: 2013-07-30

## 2013-05-25 NOTE — ED Provider Notes (Signed)
Medical screening examination/treatment/procedure(s) were performed by non-physician practitioner and as supervising physician I was immediately available for consultation/collaboration.   Dagmar Hait, MD 05/25/13 [phone]

## 2013-07-18 ENCOUNTER — Emergency Department: Payer: Self-pay | Admitting: Emergency Medicine

## 2013-07-18 LAB — BASIC METABOLIC PANEL
Anion Gap: 5 — ABNORMAL LOW (ref 7–16)
BUN: 17 mg/dL (ref 7–18)
Calcium, Total: 9 mg/dL (ref 8.5–10.1)
Chloride: 106 mmol/L (ref 98–107)
Co2: 25 mmol/L (ref 21–32)
EGFR (African American): >60
EGFR (Non-African Amer.): >60
Osmolality: 274 (ref 275–301)
Sodium: 136 mmol/L (ref 136–145)

## 2013-07-18 LAB — CK: CK, Total: 105 U/L (ref 21–215)

## 2013-07-30 ENCOUNTER — Emergency Department (HOSPITAL_COMMUNITY): Payer: Medicare Other

## 2013-07-30 ENCOUNTER — Encounter (HOSPITAL_COMMUNITY): Payer: Self-pay | Admitting: Emergency Medicine

## 2013-07-30 ENCOUNTER — Emergency Department (HOSPITAL_COMMUNITY)
Admission: EM | Admit: 2013-07-30 | Discharge: 2013-07-30 | Disposition: A | Discharge status: Home / Self Care | Payer: Medicare Other | Attending: Emergency Medicine | Admitting: Emergency Medicine

## 2013-07-30 DIAGNOSIS — Z888 Allergy status to other drugs, medicaments and biological substances status: Secondary | ICD-10-CM | POA: Insufficient documentation

## 2013-07-30 DIAGNOSIS — Z3202 Encounter for pregnancy test, result negative: Secondary | ICD-10-CM | POA: Insufficient documentation

## 2013-07-30 DIAGNOSIS — F329 Major depressive disorder, single episode, unspecified: Secondary | ICD-10-CM | POA: Insufficient documentation

## 2013-07-30 DIAGNOSIS — K5289 Other specified noninfective gastroenteritis and colitis: Secondary | ICD-10-CM | POA: Insufficient documentation

## 2013-07-30 DIAGNOSIS — K529 Noninfective gastroenteritis and colitis, unspecified: Secondary | ICD-10-CM

## 2013-07-30 DIAGNOSIS — F3289 Other specified depressive episodes: Secondary | ICD-10-CM | POA: Insufficient documentation

## 2013-07-30 DIAGNOSIS — Z885 Allergy status to narcotic agent status: Secondary | ICD-10-CM | POA: Insufficient documentation

## 2013-07-30 DIAGNOSIS — E669 Obesity, unspecified: Secondary | ICD-10-CM | POA: Insufficient documentation

## 2013-07-30 HISTORY — DX: Obesity, unspecified: E66.9

## 2013-07-30 HISTORY — DX: Depression, unspecified: F32.A

## 2013-07-30 HISTORY — DX: Major depressive disorder, single episode, unspecified: F32.9

## 2013-07-30 LAB — CBC WITH DIFFERENTIAL/PLATELET
Basophils Absolute: 0 K/uL (ref 0.0–0.1)
Basophils Relative: 0 % (ref 0–1)
Eosinophils Absolute: 0.1 K/uL (ref 0.0–0.7)
Eosinophils Relative: 1 % (ref 0–5)
HCT: 37.3 % (ref 36.0–46.0)
Hemoglobin: 12.6 g/dL (ref 12.0–15.0)
Lymphocytes Relative: 30 % (ref 12–46)
Lymphs Abs: 1.7 K/uL (ref 0.7–4.0)
MCH: 28.2 pg (ref 26.0–34.0)
MCHC: 33.8 g/dL (ref 30.0–36.0)
MCV: 83.4 fL (ref 78.0–100.0)
Monocytes Absolute: 0.4 K/uL (ref 0.1–1.0)
Monocytes Relative: 6 % (ref 3–12)
Neutro Abs: 3.6 K/uL (ref 1.7–7.7)
Neutrophils Relative %: 63 % (ref 43–77)
Platelets: 240 K/uL (ref 150–400)
RBC: 4.47 MIL/uL (ref 3.87–5.11)
RDW: 13.8 % (ref 11.5–15.5)
WBC: 5.8 K/uL (ref 4.0–10.5)

## 2013-07-30 LAB — URINALYSIS, ROUTINE W REFLEX MICROSCOPIC
Bilirubin Urine: NEGATIVE
Glucose, UA: NEGATIVE mg/dL
Ketones, ur: NEGATIVE mg/dL
Leukocytes, UA: NEGATIVE
Nitrite: NEGATIVE
Protein, ur: NEGATIVE mg/dL
Specific Gravity, Urine: 1.019 (ref 1.005–1.030)
Urobilinogen, UA: 0.2 mg/dL (ref 0.0–1.0)
pH: 7.5 (ref 5.0–8.0)

## 2013-07-30 LAB — URINE MICROSCOPIC-ADD ON

## 2013-07-30 LAB — COMPREHENSIVE METABOLIC PANEL
ALT: 17 U/L (ref 0–35)
AST: 18 U/L (ref 0–37)
Albumin: 3.6 g/dL (ref 3.5–5.2)
Calcium: 9.3 mg/dL (ref 8.4–10.5)
GFR calc Af Amer: >90 mL/min (ref >90)
Glucose, Bld: 104 mg/dL — ABNORMAL HIGH (ref 70–99)
Sodium: 136 mEq/L (ref 135–145)
Total Protein: 7.3 g/dL (ref 6.0–8.3)

## 2013-07-30 LAB — LIPASE, BLOOD: Lipase: 30 U/L (ref 11–59)

## 2013-07-30 LAB — POCT PREGNANCY, URINE: Preg Test, Ur: NEGATIVE

## 2013-07-30 MED ORDER — PROMETHAZINE HCL 25 MG PO TABS: 25.0000 mg | ORAL_TABLET | Freq: Four times a day (QID) | ORAL | Status: DC | PRN

## 2013-07-30 MED ORDER — PANTOPRAZOLE SODIUM 40 MG IV SOLR
40.0000 mg | Freq: Once | INTRAVENOUS | Status: AC
  Administered 2013-07-30: 40 mg via INTRAVENOUS
  Filled 2013-07-30: qty 40

## 2013-07-30 MED ORDER — TRAMADOL HCL 50 MG PO TABS: 50.0000 mg | ORAL_TABLET | Freq: Four times a day (QID) | ORAL | Status: DC | PRN

## 2013-07-30 NOTE — ED Provider Notes (Signed)
CSN: [id number]     Arrival date & time 07/30/13  1051 History   First MD Initiated Contact with Patient 07/30/13 1123     Chief Complaint  Patient presents with  . Abdominal Pain   (Consider location/radiation/quality/duration/timing/severity/associated sxs/prior Treatment) Patient is a 37 y.o. female presenting with abdominal pain. The history is provided by the patient (pt complains of some nauseau and abd pain). No language interpreter was used.  Abdominal Pain Pain location:  Generalized Pain quality: aching   Pain radiates to:  Does not radiate Pain severity:  Moderate Onset quality:  Gradual Timing:  Constant Chronicity:  New Relieved by:  Nothing Associated symptoms: no chest pain, no cough, no diarrhea, no fatigue and no hematuria     Past Medical History  Diagnosis Date  . Obesity   . Depression    Past Surgical History  Procedure Laterality Date  . Breast surgery     History reviewed. No pertinent family history. History  Substance Use Topics  . Smoking status: Never Smoker   . Smokeless tobacco: Current User  . Alcohol Use: No   OB History   Grav Para Term Preterm Abortions TAB SAB Ect Mult Living                 Review of Systems  Constitutional: Negative for appetite change and fatigue.  HENT: Negative for congestion, ear discharge and sinus pressure.   Eyes: Negative for discharge.  Respiratory: Negative for cough.   Cardiovascular: Negative for chest pain.  Gastrointestinal: Positive for abdominal pain. Negative for diarrhea.  Genitourinary: Negative for frequency and hematuria.  Musculoskeletal: Negative for back pain.  Skin: Negative for rash.  Neurological: Negative for seizures and headaches.  Psychiatric/Behavioral: Negative for hallucinations.    Allergies  Blueberry flavor; Morphine and related; Raspberry; and Novocain  Home Medications   Current Outpatient Rx  Name  Route  Sig  Dispense  Refill  . ibuprofen (ADVIL,MOTRIN) 200 MG  tablet   Oral   Take 600 mg by mouth every 6 (six) hours as needed for moderate pain.         . promethazine (PHENERGAN) 25 MG tablet   Oral   Take 1 tablet (25 mg total) by mouth every 6 (six) hours as needed for nausea or vomiting.   15 tablet   0   . traMADol (ULTRAM) 50 MG tablet   Oral   Take 1 tablet (50 mg total) by mouth every 6 (six) hours as needed.   20 tablet   0    BP 147/70  Pulse 61  Temp(Src) 98.7 F (37.1 C) (Oral)  Resp 16  Wt 215 lb (97.523 kg)  SpO2 100%  LMP 07/25/2013 Physical Exam  Constitutional: She is oriented to person, place, and time. She appears well-developed.  HENT:  Head: Normocephalic.  Eyes: Conjunctivae and EOM are normal. No scleral icterus.  Neck: Neck supple. No thyromegaly present.  Cardiovascular: Normal rate and regular rhythm.  Exam reveals no gallop and no friction rub.   No murmur heard. Pulmonary/Chest: No stridor. She has no wheezes. She has no rales. She exhibits no tenderness.  Abdominal: She exhibits no distension. There is tenderness. There is no rebound.  Mild tenderness llq and rlq  Musculoskeletal: Normal range of motion. She exhibits no edema.  Lymphadenopathy:    She has no cervical adenopathy.  Neurological: She is oriented to person, place, and time. She exhibits normal muscle tone. Coordination normal.  Skin: No rash noted. No  erythema.  Psychiatric: She has a normal mood and affect. Her behavior is normal.    ED Course  Procedures (including critical care time) Labs Review Labs Reviewed  COMPREHENSIVE METABOLIC PANEL - Abnormal; Notable for the following:    Glucose, Bld 104 (*)    All other components within normal limits  URINALYSIS, ROUTINE W REFLEX MICROSCOPIC - Abnormal; Notable for the following:    Hgb urine dipstick TRACE (*)    All other components within normal limits  CBC WITH DIFFERENTIAL  LIPASE, BLOOD  URINE MICROSCOPIC-ADD ON  POCT PREGNANCY, URINE   Imaging Review Dg Abd Acute  W/chest  07/30/2013   CLINICAL DATA:  37 year old female with vomiting and cramping. Initial encounter.  EXAM: ACUTE ABDOMEN SERIES (ABDOMEN 2 VIEW & CHEST 1 VIEW)  COMPARISON:  Chest radiographs 08/07/2008 and earlier. Abdomen series 510 and 9 and earlier.  FINDINGS: Stable lung volumes. Normal cardiac size and mediastinal contours. Visualized tracheal air column is within normal limits. The lungs are clear. No pneumothorax or pneumoperitoneum.  Non obstructed bowel gas pattern. Abdominal and pelvic visceral contours are within normal limits. No osseous abnormality identified.  IMPRESSION: Non obstructed bowel gas pattern, no free air.  Negative chest.   Electronically Signed   By: Augusto Gamble M.D.   On: 07/30/2013 13:48    EKG Interpretation   None       MDM   1. Gastroenteritis        Benny Lennert, MD 07/30/13 1447

## 2013-07-30 NOTE — Progress Notes (Signed)
   CARE MANAGEMENT ED NOTE 07/30/2013  Patient:  Darlene Guzman, Darlene Guzman   Account Number:  [id number]  Date Initiated:  07/30/2013  Documentation initiated by:  Michel Bickers  Subjective/Objective Assessment:   Pt presented to ED with abdominal pains and vomiting     Subjective/Objective Assessment Detail:     Action/Plan:   Action/Plan Detail:   Anticipated DC Date:  07/30/2013     Status Recommendation to Physician:   Result of Recommendation:    Other ED Services  Appropriate Status Consult    DC Planning Services  [facility] Program    Choice offered to / List presented to:  C-1 Patient          Status of service:  Completed, signed off  ED Comments:   ED Comments Detail:  07/30/13 1440 W. Aundria Rud RN BSN [id number] ED CM noted patient to be without PCP/ Health insurance. Pt presented to [facility] ED with Abd pains and vomiting. In to speak with patient, Patient states, she does not  have a PCP or helath insurance.  Pt also disclosed that [place] and [place]sters. Discussed possible resources for homeless residents of [employer]. Provided patient with information and phone numbers about the [place] [facility], [facility], and the local food pantries.Explained thatpatient can follow up with medical care at [facility]. She verbalized understanding. DSS address and phone number given. Pt encouraged contact the DSS ASAP. Patient given a sandwich while in ED. Patient reports not having money to pay for medications.  Pt eligible for MATCH. Discussed the [facility] program and the guidelines. Pt in agreeable wtih disp plan. Co-pay waived. Pt verbalized understanding employed the teach back method. Plan discuissed with Melony Overly RN in agrees with plan. ED SW consulted and also agrees with plan. No further ED CM needs identified.

## 2013-07-30 NOTE — ED Notes (Signed)
Pt reports onset yesterday of abd cramping every time she eats, has n/v and constipation. Pain when having small bowel movement this am. Also reports dental pain. Airway intact.

## 2013-09-30 ENCOUNTER — Emergency Department (HOSPITAL_COMMUNITY): Payer: Self-pay

## 2013-09-30 ENCOUNTER — Encounter (HOSPITAL_COMMUNITY): Payer: Self-pay | Admitting: Emergency Medicine

## 2013-09-30 ENCOUNTER — Emergency Department (HOSPITAL_COMMUNITY)
Admission: EM | Admit: 2013-09-30 | Discharge: 2013-10-01 | Disposition: A | Discharge status: Home / Self Care | Payer: Self-pay | Attending: Emergency Medicine | Admitting: Emergency Medicine

## 2013-09-30 DIAGNOSIS — K089 Disorder of teeth and supporting structures, unspecified: Secondary | ICD-10-CM | POA: Insufficient documentation

## 2013-09-30 DIAGNOSIS — Z885 Allergy status to narcotic agent status: Secondary | ICD-10-CM | POA: Insufficient documentation

## 2013-09-30 DIAGNOSIS — F329 Major depressive disorder, single episode, unspecified: Secondary | ICD-10-CM | POA: Insufficient documentation

## 2013-09-30 DIAGNOSIS — F3289 Other specified depressive episodes: Secondary | ICD-10-CM | POA: Insufficient documentation

## 2013-09-30 DIAGNOSIS — Y9301 Activity, walking, marching and hiking: Secondary | ICD-10-CM | POA: Insufficient documentation

## 2013-09-30 DIAGNOSIS — R269 Unspecified abnormalities of gait and mobility: Secondary | ICD-10-CM | POA: Insufficient documentation

## 2013-09-30 DIAGNOSIS — K029 Dental caries, unspecified: Secondary | ICD-10-CM | POA: Insufficient documentation

## 2013-09-30 DIAGNOSIS — M6281 Muscle weakness (generalized): Secondary | ICD-10-CM | POA: Insufficient documentation

## 2013-09-30 DIAGNOSIS — R4182 Altered mental status, unspecified: Secondary | ICD-10-CM | POA: Insufficient documentation

## 2013-09-30 DIAGNOSIS — Z3202 Encounter for pregnancy test, result negative: Secondary | ICD-10-CM | POA: Insufficient documentation

## 2013-09-30 DIAGNOSIS — E669 Obesity, unspecified: Secondary | ICD-10-CM | POA: Insufficient documentation

## 2013-09-30 DIAGNOSIS — K0381 Cracked tooth: Secondary | ICD-10-CM | POA: Insufficient documentation

## 2013-09-30 DIAGNOSIS — R262 Difficulty in walking, not elsewhere classified: Secondary | ICD-10-CM | POA: Insufficient documentation

## 2013-09-30 DIAGNOSIS — R42 Dizziness and giddiness: Secondary | ICD-10-CM | POA: Insufficient documentation

## 2013-09-30 DIAGNOSIS — W108XXA Fall (on) (from) other stairs and steps, initial encounter: Secondary | ICD-10-CM | POA: Insufficient documentation

## 2013-09-30 DIAGNOSIS — E119 Type 2 diabetes mellitus without complications: Secondary | ICD-10-CM | POA: Insufficient documentation

## 2013-09-30 DIAGNOSIS — Y929 Unspecified place or not applicable: Secondary | ICD-10-CM | POA: Insufficient documentation

## 2013-09-30 DIAGNOSIS — R110 Nausea: Secondary | ICD-10-CM | POA: Insufficient documentation

## 2013-09-30 HISTORY — DX: Type 2 diabetes mellitus without complications: E11.9

## 2013-09-30 LAB — RAPID URINE DRUG SCREEN, HOSP PERFORMED
Amphetamines: NOT DETECTED
Barbiturates: NOT DETECTED
Benzodiazepines: NOT DETECTED
Cocaine: NOT DETECTED
Opiates: NOT DETECTED
Tetrahydrocannabinol: NOT DETECTED

## 2013-09-30 LAB — CBC
HEMATOCRIT: 37.1 % (ref 36.0–46.0)
HEMOGLOBIN: 12.5 g/dL (ref 12.0–15.0)
MCH: 28.8 pg (ref 26.0–34.0)
MCHC: 33.7 g/dL (ref 30.0–36.0)
MCV: 85.5 fL (ref 78.0–100.0)
Platelets: 224 10*3/uL (ref 150–400)
RBC: 4.34 MIL/uL (ref 3.87–5.11)
RDW: 13.7 % (ref 11.5–15.5)
WBC: 8.7 10*3/uL (ref 4.0–10.5)

## 2013-09-30 LAB — PROTIME-INR
INR: 0.94 (ref 0.00–1.49)
Prothrombin Time: 12.4 seconds (ref 11.6–15.2)

## 2013-09-30 LAB — COMPREHENSIVE METABOLIC PANEL
ALK PHOS: 71 U/L (ref 39–117)
ALT: 19 U/L (ref 0–35)
AST: 16 U/L (ref 0–37)
Albumin: 3.6 g/dL (ref 3.5–5.2)
BILIRUBIN TOTAL: 0.2 mg/dL — AB (ref 0.3–1.2)
BUN: 14 mg/dL (ref 6–23)
CHLORIDE: 103 meq/L (ref 96–112)
CO2: 26 meq/L (ref 19–32)
Calcium: 8.9 mg/dL (ref 8.4–10.5)
Creatinine, Ser: 0.71 mg/dL (ref 0.50–1.10)
GLUCOSE: 86 mg/dL (ref 70–99)
POTASSIUM: 3.9 meq/L (ref 3.7–5.3)
SODIUM: 141 meq/L (ref 137–147)
Total Protein: 7.1 g/dL (ref 6.0–8.3)

## 2013-09-30 LAB — URINALYSIS, ROUTINE W REFLEX MICROSCOPIC
BILIRUBIN URINE: NEGATIVE
GLUCOSE, UA: NEGATIVE mg/dL
HGB URINE DIPSTICK: NEGATIVE
Ketones, ur: NEGATIVE mg/dL
Leukocytes, UA: NEGATIVE
Nitrite: NEGATIVE
PROTEIN: NEGATIVE mg/dL
Specific Gravity, Urine: 1.03 (ref 1.005–1.030)
Urobilinogen, UA: 0.2 mg/dL (ref 0.0–1.0)
pH: 6 (ref 5.0–8.0)

## 2013-09-30 LAB — APTT: APTT: 29 s (ref 24–37)

## 2013-09-30 LAB — ETHANOL: Alcohol, Ethyl (B): <11 mg/dL (ref 0–11)

## 2013-09-30 LAB — POCT I-STAT TROPONIN I: Troponin i, poc: 0.01 ng/mL (ref 0.00–0.08)

## 2013-09-30 LAB — LIPASE, BLOOD: LIPASE: 31 U/L (ref 11–59)

## 2013-09-30 LAB — PREGNANCY, URINE: Preg Test, Ur: NEGATIVE

## 2013-09-30 LAB — TROPONIN I: Troponin I: <0.3 ng/mL (ref <0.30)

## 2013-09-30 MED ORDER — SODIUM CHLORIDE 0.9 % IV BOLUS (SEPSIS)
1000.0000 mL | Freq: Once | INTRAVENOUS | Status: AC
  Administered 2013-09-30: 1000 mL via INTRAVENOUS

## 2013-09-30 MED ORDER — METOCLOPRAMIDE HCL 5 MG/ML IJ SOLN: 10.0000 mg | Freq: Once | INTRAMUSCULAR | Status: DC

## 2013-09-30 MED ORDER — PENICILLIN V POTASSIUM 500 MG PO TABS: 500.0000 mg | ORAL_TABLET | Freq: Four times a day (QID) | ORAL | Status: DC

## 2013-09-30 MED ORDER — PENICILLIN V POTASSIUM 250 MG PO TABS
500.0000 mg | ORAL_TABLET | Freq: Once | ORAL | Status: AC
  Administered 2013-09-30: 500 mg via ORAL
  Filled 2013-09-30: qty 2

## 2013-09-30 MED ORDER — LORAZEPAM 2 MG/ML IJ SOLN
1.0000 mg | Freq: Once | INTRAMUSCULAR | Status: AC
  Administered 2013-09-30: 1 mg via INTRAVENOUS
  Filled 2013-09-30: qty 1

## 2013-09-30 MED ORDER — TRAMADOL HCL 50 MG PO TABS: 50.0000 mg | ORAL_TABLET | Freq: Four times a day (QID) | ORAL | Status: DC | PRN

## 2013-09-30 NOTE — ED Notes (Signed)
Pt. Ate a box lunch and drink, Attempted to get pt oob , unsuccessful.  Pt. Became very dizzy and very emotional.  Pt. Began to cry , support given. Pt. Placed back into bed and reassurance given.  Vitals are stable.

## 2013-09-30 NOTE — ED Notes (Signed)
Got patient up to walk patient stated she was to dizzy to walk

## 2013-09-30 NOTE — ED Provider Notes (Signed)
Medical screening examination/treatment/procedure(s) were conducted as a shared visit with non-physician practitioner(s) and myself.  I personally evaluated the patient during the encounter.  EKG Interpretation   None      Pt states unable to walk due to clumsy gait denies light headedness; no nystagmus; negative test of skew; normal bilat F-N testing; MR ordered. 1955  Pt still unable to walk feels generally weak still has some sensation of vertigo; unassigned Med paged for Obs. 0115  Pt now able to walk unassisted, wants discharge after seen by [facility]. No CVA on MRI. [id number]  Hurman HornJohn M Levetta Bognar, MD 10/03/13 [phone]

## 2013-09-30 NOTE — ED Provider Notes (Signed)
CSN: [id number]     Arrival date & time 09/30/13  1233 History   First MD Initiated Contact with Patient 09/30/13 1331     Chief Complaint  Patient presents with  . Dizziness   (Consider location/radiation/quality/duration/timing/severity/associated sxs/prior Treatment) HPI Comments: Patient is a 38 year old female with a past medical history of diabetes and depression who presents with dizziness for the past 4 days. Patient presents via EMS from work. The dizziness is described as lightheadedness. Symptoms started gradually and progressively worsened since the onset. The symptoms became acutely worse this morning. Patient reports walking down the stairs and felt lightheaded and fell down 4 stairs. Patient denies head trauma or LOC. Patient reports associated nausea. Patient reports some associated dental pain and is wondering if her "bad teeth" could contribute to her feeling lightheaded. Patient reports she is fine when she is laying down but feels worse with sitting or standing.    Past Medical History  Diagnosis Date  . Obesity   . Depression   . Diabetes mellitus without complication    Past Surgical History  Procedure Laterality Date  . Breast surgery     No family history on file. History  Substance Use Topics  . Smoking status: Never Smoker   . Smokeless tobacco: Current User  . Alcohol Use: No   OB History   Grav Para Term Preterm Abortions TAB SAB Ect Mult Living                 Review of Systems  Constitutional: Negative for fever, chills and fatigue.  HENT: Positive for dental problem. Negative for trouble swallowing.   Eyes: Negative for visual disturbance.  Respiratory: Negative for shortness of breath.   Cardiovascular: Negative for chest pain and palpitations.  Gastrointestinal: Negative for nausea, vomiting, abdominal pain and diarrhea.  Genitourinary: Negative for dysuria and difficulty urinating.  Musculoskeletal: Negative for arthralgias and neck pain.    Skin: Negative for color change.  Neurological: Positive for light-headedness. Negative for dizziness and weakness.  Psychiatric/Behavioral: Negative for dysphoric mood.    Allergies  Blueberry flavor; Morphine and related; Raspberry; and Novocain  Home Medications   Current Outpatient Rx  Name  Route  Sig  Dispense  Refill  . ibuprofen (ADVIL,MOTRIN) 200 MG tablet   Oral   Take 600 mg by mouth every 6 (six) hours as needed for moderate pain.         . promethazine (PHENERGAN) 25 MG tablet   Oral   Take 1 tablet (25 mg total) by mouth every 6 (six) hours as needed for nausea or vomiting.   15 tablet   0    BP 131/80  Pulse 81  Temp(Src) 97.8 F (36.6 C) (Oral)  Resp 18  Ht 5' 0.25" (1.53 m)  Wt 221 lb (100.245 kg)  BMI 42.82 kg/m2  SpO2 100%  LMP 09/20/2013 Physical Exam  Nursing note and vitals reviewed. Constitutional: She is oriented to person, place, and time. She appears well-developed and well-nourished. No distress.  HENT:  Head: Normocephalic and atraumatic.  Mouth/Throat: Oropharynx is clear and moist. No oropharyngeal exudate.    Poor dentition. Cracked and decayed teeth. Left lower molar tender to percussion.   Eyes: Conjunctivae and EOM are normal. Pupils are equal, round, and reactive to light.  Neck: Normal range of motion.  Cardiovascular: Normal rate and regular rhythm.  Exam reveals no gallop and no friction rub.   No murmur heard. Pulmonary/Chest: Effort normal and breath sounds normal.  She has no wheezes. She has no rales. She exhibits no tenderness.  Abdominal: Soft. She exhibits no distension. There is no tenderness. There is no rebound.  Musculoskeletal: Normal range of motion.  Neurological: She is alert and oriented to person, place, and time. Coordination normal.  Speech is goal-oriented. Moves limbs without ataxia.   Skin: Skin is warm and dry.  Psychiatric: She has a normal mood and affect. Her behavior is normal.    ED Course   Procedures (including critical care time) Labs Review Labs Reviewed  COMPREHENSIVE METABOLIC PANEL - Abnormal; Notable for the following:    Total Bilirubin 0.2 (*)    All other components within normal limits  URINE CULTURE  CBC  LIPASE, BLOOD  PREGNANCY, URINE  URINALYSIS, ROUTINE W REFLEX MICROSCOPIC  ETHANOL  URINE RAPID DRUG SCREEN (HOSP PERFORMED)  PROTIME-INR  APTT  TROPONIN I  POCT I-STAT TROPONIN I   Imaging Review Mr Brain Wo Contrast  10/01/2013   CLINICAL DATA:  Off balance, altered mental status.  EXAM: MRI HEAD WITHOUT CONTRAST  TECHNIQUE: Multiplanar, multiecho pulse sequences of the brain and surrounding structures were obtained without intravenous contrast.  COMPARISON:  CT of the head June 02, 2007  FINDINGS: Motion degraded examination, this most FX the axial FSPGR, axial SWAN and coronal T2.  The ventricles and sulci are normal for patient's age. No abnormal parenchymal signal, mass lesions, mass effect. No reduced diffusion to suggest acute ischemia. No susceptibility artifact to suggest hemorrhage.  No abnormal extra-axial fluid collections. No extra-axial masses though, contrast enhanced sequences would be more sensitive. Normal major intracranial vascular flow voids seen at the skull base.  Ocular globes and orbital contents are unremarkable though not tailored for evaluation. No abnormal sellar expansion. Mild paranasal sinus mucosal thickening without paranasal sinus air-fluid levels. No suspicious calvarial bone marrow signal. No abnormal sellar expansion. Craniocervical junction maintained.  Included view of the neck demonstrates lobulated T2 bright presumed lymphadenopathy in the neck, coronal T2 16-17/24.  IMPRESSION: Motion degraded examination, normal noncontrast MRI of the brain.  Mild paranasal sinusitis.  Suspected jugulodigastric lymphadenopathy, recommend correlation with physical examination.   Electronically Signed   By: Awilda Metro   On:  10/01/2013 00:14    EKG Interpretation   None       MDM   1. Vertigo     3:43 PM Labs and urinalysis unremarkable. Patient likely has a dental infection causing her lightheadedness. Vitals stable and patient afebrile. Patient will be discharged with penicillin and percocet and recommended dental follow up. Patient advised to return with worsening or concerning symptoms.   Patient signed out to Dr. Fonnie Jarvis.   Emilia Beck, [place] 10/01/13 [phone]

## 2013-09-30 NOTE — ED Notes (Signed)
Attempted to get pt. Out of bed. Pt. Was unable to stand straight up.  Pt. Stated, "I am very dizzy."   Reported to LorenzoKaitlyn, [place]

## 2013-09-30 NOTE — ED Notes (Signed)
Pt. Given a lunch bag and drink.

## 2013-09-30 NOTE — ED Notes (Signed)
Pt was at work, got up from toilet after having bm (normal per pt) and began feeling dizzy.  Pt has been nauseated and c/o lower abdominal pain x 1 week.  States sat she felt very dizzy and tripped down 4 stairs b/c she was so dizzy, lightheaded and felt nauseated.  EMS gave 4 zofran with some relief of nausea, but not dizziness.  CBG 133 per EMS.

## 2013-10-01 LAB — URINE CULTURE
Colony Count: >=100000
SPECIAL REQUESTS: NORMAL

## 2013-10-01 MED ORDER — MECLIZINE HCL 25 MG PO TABS: 25.0000 mg | ORAL_TABLET | Freq: Four times a day (QID) | ORAL | Status: DC | PRN

## 2013-10-01 NOTE — Discharge Instructions (Signed)
Take penicillin as directed until gone. Take tramadol as needed for pain. Follow up with a dentist asap for further evaluation and treatment. Call within 24 hours of being seen to ensure follow up.    Emergency Department Resource Guide 1) Find a Doctor and Pay Out of Pocket Although you won't have to find out who is covered by your insurance plan, it is a good idea to ask around and get recommendations. You will then need to call the office and see if the doctor you have chosen will accept you as a new patient and what types of options they offer for patients who are self-pay. Some doctors offer discounts or will set up payment plans for their patients who do not have insurance, but you will need to ask so you aren't surprised when you get to your appointment.  2) Contact Your Local Health Department Not all health departments have doctors that can see patients for sick visits, but many do, so it is worth a call to see if yours does. If you don't know where your local health department is, you can check in your phone book. The CDC also has a tool to help you locate your state's health department, and many state websites also have listings of all of their local health departments.  3) Find a Walk-in Clinic If your illness is not likely to be very severe or complicated, you may want to try a walk in clinic. These are popping up all over the country in pharmacies, drugstores, and shopping centers. They're usually staffed by nurse practitioners or physician assistants that have been trained to treat common illnesses and complaints. They're usually fairly quick and inexpensive. However, if you have serious medical issues or chronic medical problems, these are probably not your best option.  No Primary Care Doctor: - Call Health Connect at  [phone] - they can help you locate a primary care doctor that  accepts your insurance, provides certain services, etc. - Physician Referral Service-  [phone]  Chronic Pain Problems: Organization         Address  Phone   Notes  [facility]  [phone] Patients need to be referred by their primary care doctor.   Medication Assistance: Organization         Address  Phone   Notes  [facility] Medication [facility] [address] [place], [place] [address] [phone] --Must be a resident of [facility] -- Must have NO insurance coverage whatsoever (no Medicaid/ Medicare, etc.) -- The pt. MUST have a primary care doctor that directs their care regularly and follows them in the community   [facility]  [phone]   [employer]  [phone]    Agencies that provide inexpensive medical care: Organization         Address  Phone   Notes  [facility]  [phone]   [facility]    [phone]   [facility] [address] [place], [place] [address] [phone]   [facility] [address] [place]. [address] [place] [phone]   Planned Parenthood    [phone]   [facility]    [phone]   [facility] and [facility]  [address], [place] Phone:  [phone], Fax:  [phone] Hours of Operation:  9 am - 6 pm, M-F.  Also accepts Medicaid/Medicare and self-pay.  [facility]  for Children  [address], [place] Phone: [phone], Fax: [phone]. Hours of Operation:  8:30 am - 5:30 pm, M-F.  Also accepts Medicaid and self-pay.  [facility] [place] [address], [place] Phone: [phone]   [place], [place], [place] [phone], Ext. [phone] Mondays & Thursdays: 7-9 AM.  First 15 patients are seen on a first come, first serve basis.    [place] Providers:  Organization         Address  Phone   Notes  [facility] [address],  [place] [phone] Also accepts self-pay patients.  [facility] [address], [place]  [phone]   [place], [address], [place] [phone]   [facility] [address] [place] [phone]   Lucianne Lei [address], [place]   [phone] Only accepts [place] Access [place] patients after they have their name applied to their card.   Self-Pay (no insurance) in [facility]:  Organization         Address  Phone   Notes  Sickle Cell Patients, [facility] Internal Medicine [place] [phone]   [facility] Urgent Care [place] [phone]   [facility] [place]  [place], [place], [place] [phone]   [facility]/Dr. Osei-Bonsu  [address], [place] or [address], [place] [phone] Phone number for both [place] and [place] locations is the same.  [facility]gent [facility] and [facility] [address], [place] [phone]   [facility] [address] [place] or [address] [phone]   [facility] [address], [place] [phone], phone; [phone], fax Sees patients 1st and 3rd Saturday of every month.  Must not qualify for public or private insurance (i.e. Medicaid, Medicare, [place] Health Choice, Veterans' Benefits)  Household income should be no more than 200% of the poverty level The clinic cannot treat you if you are pregnant or think you are pregnant  Sexually transmitted diseases are not treated at the clinic.    Dental Care: Organization         Address  Phone  Notes  [facility] Department of [facility] [phone] Accepts children up to age 29 who are enrolled in  [place] or [place]; pregnant women with a Medicaid card; and children who have applied for Medicaid or [place] Health Choice, but were declined, whose parents can pay a reduced fee at time of service.  [facility] Department of [facility]  [address], [place] [phone] Accepts children up to age 36 who are enrolled in [place] or [place]; pregnant women with a Medicaid card; and children who have applied for Medicaid or [place] Health Choice, but were declined, whose parents can pay a reduced fee at time of service.  [place] Adult [facility] Access PROGRAM  [place] [phone] Patients are seen by appointment only. Walk-ins are not accepted. [place] will see patients 66 years of age and older. Monday - Tuesday (8am-5pm) Most Wednesdays (8:30-5pm) $30 per visit, cash only  [place] [facility]  [address], [facility] [phone] Patients are seen by appointment only. Walk-ins are not accepted. [place] will see patients 55 years of age and older. One Wednesday Evening (Monthly: Volunteer Based).  $30 per visit, cash only  [place]  [phone] for adults; Children under age 16, call Graduate Pediatric Dentistry at [phone]. Children aged 15-14, please call [phone] to request a pediatric application.  Dental services are provided in all areas of dental care including fillings, crowns and bridges, complete and partial dentures, implants, gum treatment, root canals, and extractions. Preventive care is also provided. Treatment is provided to both adults and children. Patients are selected via a lottery and there is often a waiting list.   [facility] [address] [place]  [phone] [email or web address]   [facility] [address] [place], [place] [phone], Ext. [phone] Second and Fourth Thursday of each month, opens at 6:30  AM; Clinic ends at 9 AM.  Patients are seen on a first-come first-served basis, and a limited number are seen during each clinic.   [facility]  [address] Hillard Danker [place], [place] [phone]   Eligibility Requirements You must have lived in [place], [place], or [place] counties for at least the last three months.   You cannot be eligible for state or federal sponsored [employer], including [employer], [place], or [employer].   You generally cannot be eligible for healthcare insurance through your employer.    How to apply: Eligibility screenings are held every Tuesday and Wednesday afternoon from 1:00 pm until 4:00 pm. You do not need an appointment for the interview!  [facility] [address] [place], [place]   [employer]  [place] Department  [place]  [phone]    Behavioral Health Resources in the Community: Intensive Outpatient Programs Organization         Address  Phone  Notes  [facility] [place]. [address], [place], [place] [phone]   [facility] Outpatient [address], [place], [employer]   ADS: [facility] [address], [place], [place]   [place] [address]  [place], [place] or [phone]   Substance Abuse Resources Organization         Address  Phone  Notes  Alcohol and Drug Services  [phone]   [place]  [phone]   [facility] [place]   Chinita Pester  [phone]   Residential & Outpatient Substance Abuse Program  [phone]   Psychological Services Organization         Address  Phone  Notes  [facility] [place]  [place]  [phone]   [place] [address] [place] [phone] or  [phone]    Mobile Crisis Teams Organization         Address  Phone  Notes  [facility], Mobile Crisis Care Unit  [phone]   [facility]  [address] [place], [place]   [place] [address] [place] [phone]    Self-Help/Support Groups Organization         Address  Phone             Notes  [facility] [place] - variety of support groups  336- I7437963 Call for more information  Narcotics Anonymous (NA), [facility] [address], [employer] [place]  2 meetings at this location   Statistician         Address  Phone  Notes  [facility] [address],    [place]  [phone]   [facility]  [address], [place] [address], [place], [place] [phone]   [facility] [address] [place], [place] [phone] Admissions: 8am-3pm M-F  [facility] [address]    [place], [place] [phone]   [facility] [address] [place], [place], [place] [phone]   [facility] [address]  [place], [place] [phone]   [facility] - Intensive Outpatient [address]ure[address], [place], [place] [phone]   [facility] ([facility] [address] [place]  [place], [place] [phone] or [phone]   [facility] ([facility]) [address], [place], [place] [phone] Accepts Medicaid  [facility] [place] [address]  [place] [phone] Substance Abuse/Addiction Treatment   [facility] Organization         Address  Phone  Notes  [facility]  [phone]   Angie Fava, PhD [address] Ervin Knack [place], [place]   [phone] or [phone]   [facility]   [address] [place], [place] [phone]   [facility] [address]  [place], [place] [phone] Insurance/Medicaid/sponsorship through [facility] and Families [address]                                    [place], [place] [phone] Therapy/tele-psych/case  [facility] [place] [address], [place] [phone]    Dr. Lolly Mustache  [phone]   [facility]  [employer] [place] Dept. 1) [address] [place] 2) [address] [place] 3)  [address], [place] [phone]  [phone]   [facility] Child Abuse Hotline 971-244-7453 or 734-058-1540 (After Hours)      Your exam shows you have had an episode of vertigo, which causes a false sense of movement such as a spinning feeling or walls that seem to move.  Most vertigo is caused by a (usually temporary) problem in the inner ear. Rarely, the back part of the brain can cause vertigo (some mini-strokes / TIA's / strokes), but it appears to be a low risk cause for you at this time. It is important to follow-up with your doctor however, to see if you need further testing.  Do not drive or participate in potentially dangerous activities requiring balance unless off meds (not drowsy) and the vertigo has resolved. Most of the time benign vertigo is much better after a few days. However, mild unsteadiness may last for up to 3 months in some patients. An MRI scan or other special tests to evaluate your hearing and balance may be needed if the vertigo does not improve or returns in the future. RETURN IMMEDIATELY IF YOU HAVE ANY OF THE FOLLOWING (call 911): Increasing vertigo, earache, ear drainage, or loss of hearing.  Severe headache, blurred or double vision, or trouble walking.  Fainting or  poorly responsive, extreme weakness, chest pain, or palpitations.  Fever, persistent vomiting, or dehydration.  Numbness, tingling, incoordination, or weakness of the limbs.  Change in speech, vision, swallowing,  understanding, or other concerns.  Your caregiver has seen you today because you are having problems with feelings of weakness, dizziness, and/or fatigue. Weakness has many different causes, some of which are common and others are very rare. Your caregiver has considered some of the most common causes of weakness and feels it is safe for you to go home and be observed. Not every illness or injury can be identified during an emergency department visit, thus follow-up with your primary healthcare provider is important. Medical conditions can also worsen, so it is also important to return immediately as directed below, or if you have other serious concerns develop. RETURN IMMEDIATELY IF you develop new shortness of breath, chest pain, fever, have difficulty moving parts of your body (new weakness, numbness, or incoordination), sudden change in speech, vision, swallowing, or understanding, faint or develop new dizziness, severe headache, become poorly responsive or have an altered mental status compared to baseline for you, new rash, abdominal pain, or bloody stools,  Return sooner also if you develop new problems for which you have not talked to your caregiver but you feel may be emergency medical conditions, or are unable to be cared for safely at home.

## 2013-10-01 NOTE — ED Provider Notes (Signed)
Medical screening examination/treatment/procedure(s) were performed by non-physician practitioner and as supervising physician I was immediately available for consultation/collaboration.  EKG Interpretation   None         Amariona Rathje S Rhone Ozaki, MD 10/01/13 1128 

## 2013-11-15 ENCOUNTER — Emergency Department: Payer: Self-pay | Admitting: Emergency Medicine

## 2018-07-05 ENCOUNTER — Ambulatory Visit (HOSPITAL_COMMUNITY)
Admission: EM | Admit: 2018-07-05 | Discharge: 2018-07-05 | Disposition: A | Discharge status: Home / Self Care | Payer: Self-pay | Attending: Family Medicine | Admitting: Family Medicine

## 2018-07-05 ENCOUNTER — Encounter (HOSPITAL_COMMUNITY): Payer: Self-pay | Admitting: Emergency Medicine

## 2018-07-05 DIAGNOSIS — M545 Low back pain, unspecified: Secondary | ICD-10-CM

## 2018-07-05 DIAGNOSIS — R112 Nausea with vomiting, unspecified: Secondary | ICD-10-CM

## 2018-07-05 LAB — POCT URINALYSIS DIP (DEVICE)
BILIRUBIN URINE: NEGATIVE
GLUCOSE, UA: NEGATIVE mg/dL
LEUKOCYTES UA: NEGATIVE
NITRITE: NEGATIVE
Protein, ur: NEGATIVE mg/dL
Specific Gravity, Urine: 1.025 (ref 1.005–1.030)
UROBILINOGEN UA: 0.2 mg/dL (ref 0.0–1.0)
pH: 5.5 (ref 5.0–8.0)

## 2018-07-05 LAB — POCT PREGNANCY, URINE: PREG TEST UR: NEGATIVE

## 2018-07-05 MED ORDER — CYCLOBENZAPRINE HCL 10 MG PO TABS: 10.0000 mg | ORAL_TABLET | Freq: Two times a day (BID) | ORAL | 0 refills | Status: DC | PRN

## 2018-07-05 NOTE — Discharge Instructions (Addendum)
Your nausea, vomiting, and diarrhea appear to have a viral cause. Your symptoms should improve over the next week as your body continues to rid the infectious cause.  For nausea: Zofran prescribed. Begin with every 6 hours, than as you are able to hold food down, take it as needed. Start with clear liquids, then move to plain foods like bananas, rice, applesauce, toast, broth, grits, oatmeal. As those food settle okay you may transition to your normal foods. Avoid spicy and greasy foods as much as possible.  For Diarrhea: Continue to monitor bowel movements  Preventing dehydration is key! You need to replace the fluid your body is expelling. Drink plenty of fluids, may use Pedialyte or sports drinks.   Please return if you are experiencing blood in your vomit or stool or experiencing dizziness, lightheadedness, extreme fatigue, increased abdominal pain.   Use anti-inflammatories for pain/swelling. You may take up to 800 mg Ibuprofen every 8 hours with food. You may supplement Ibuprofen with Tylenol [phone] mg every 8 hours.   You may use flexeril as needed to help with pain. This is a muscle relaxer and causes sedation- please use only at bedtime or when you will be home and not have to drive/work  Ice heat  Please move around so back does not get stiff/weaker  Follow up if developing pain, weakness, loss of bowel or bladder habits

## 2018-07-05 NOTE — ED Provider Notes (Signed)
[facility]    CSN: [id number] Arrival date & time: 07/05/18  1429     History   Chief Complaint Chief Complaint  Patient presents with  . Emesis  . Diarrhea    HPI Darlene Guzman is a 42 y.o. female history of depression, DM, obesity presenting today for evaluation of multiple complaints.  Patient states that she has had back pain, vomiting as well as concern for allergies.  Patient states that over the past 3 days she has had gradually worsening lower back pain.  The back pain is worse on the left side and will radiate into the leg.  She denies associated numbness or tingling, saddle anesthesia.  Denies change in bowel or bladder control.  She denies dysuria.  Has had increased urinary frequency and has noted her urine to be clear.  States that the back pain occasionally will radiate to lower abdomen with a cramping sensation.  She notes that she ended her menstrual cycle 3 days ago which could be a cause of this cramping.  Since last night she has had 5 episodes of vomiting and nausea, 2 episodes this morning.  She denies diarrhea, but notes that her stools have been looser than normal.  Denies blood in the vomit or stool.  Patient has some mild off-and-on congestion associated with this.  States that she has had a history of ovarian cysts, but this is a different discomfort.  She has also had history of kidney stones.  Denies fevers, chest pain, shortness of breath.  HPI  Past Medical History:  Diagnosis Date  . Depression   . Diabetes mellitus without complication (HCC)   . Obesity     There are no active problems to display for this patient.   Past Surgical History:  Procedure Laterality Date  . BREAST SURGERY      OB History   None      Home Medications    Prior to Admission medications   Medication Sig Start Date End Date Taking? Authorizing Provider  cyclobenzaprine (FLEXERIL) 10 MG tablet Take 1 tablet (10 mg total) by mouth 2 (two) times daily as  needed for muscle spasms. 07/05/18   Wieters, Hallie C, PA-C  meclizine (ANTIVERT) 25 MG tablet Take 1 tablet (25 mg total) by mouth every 6 (six) hours as needed for dizziness. 10/01/13   Wayland Salinas, MD  promethazine (PHENERGAN) 25 MG tablet Take 1 tablet (25 mg total) by mouth every 6 (six) hours as needed for nausea or vomiting. 07/30/13   Bethann Berkshire, MD    Family History No family history on file.  Social History Social History   Tobacco Use  . Smoking status: Never Smoker  . Smokeless tobacco: Current User  Substance Use Topics  . Alcohol use: No  . Drug use: No     Allergies   Blueberry flavor; Morphine and related; Raspberry; and Novocain [procaine]   Review of Systems Review of Systems  Constitutional: Negative for activity change, appetite change, chills, fatigue and fever.  HENT: Positive for congestion and rhinorrhea. Negative for ear pain, sinus pressure, sore throat and trouble swallowing.   Eyes: Negative for discharge and redness.  Respiratory: Negative for cough, chest tightness and shortness of breath.   Cardiovascular: Negative for chest pain.  Gastrointestinal: Positive for abdominal pain, nausea and vomiting. Negative for diarrhea.  Genitourinary: Positive for frequency. Negative for dysuria, flank pain, genital sores, hematuria, menstrual problem, vaginal bleeding, vaginal discharge and vaginal pain.  Musculoskeletal: Positive for  back pain and myalgias.  Skin: Negative for rash.  Neurological: Negative for dizziness, light-headedness and headaches.     Physical Exam Triage Vital Signs ED Triage Vitals  Enc Vitals Group     BP 07/05/18 1443 131/86     Pulse Rate 07/05/18 1441 99     Resp 07/05/18 1441 16     Temp 07/05/18 1441 97.9 F (36.6 C)     Temp Source 07/05/18 1441 Oral     SpO2 07/05/18 1441 97 %     Weight --      Height --      Head Circumference --      Peak Flow --      Pain Score 07/05/18 1443 6     Pain Loc --      Pain  Edu? --      Excl. in GC? --    No data found.  Updated Vital Signs BP 131/86   Pulse 99   Temp 97.9 F (36.6 C) (Oral)   Resp 16   LMP 06/28/2018   SpO2 97%   Visual Acuity Right Eye Distance:   Left Eye Distance:   Bilateral Distance:    Right Eye Near:   Left Eye Near:    Bilateral Near:     Physical Exam  Constitutional: She appears well-developed and well-nourished. No distress.  HENT:  Head: Normocephalic and atraumatic.  Bilateral ears without tenderness to palpation of external auricle, tragus and mastoid, EAC's without erythema or swelling, TM's with good bony landmarks and cone of light. Non erythematous.  Nasal mucosa erythematous, swollen turbinates  Oral mucosa pink and moist, no tonsillar enlargement or exudate. Posterior pharynx patent and nonerythematous, no uvula deviation or swelling. Normal phonation.  Eyes: Conjunctivae are normal.  Neck: Neck supple.  Cardiovascular: Normal rate and regular rhythm.  No murmur heard. Pulmonary/Chest: Effort normal and breath sounds normal. No respiratory distress.  Breathing comfortably at rest, CTABL, no wheezing, rales or other adventitious sounds auscultated  Abdominal: Soft. She exhibits distension. There is no tenderness.  Patient obese, upper abdomen appears and feels slightly distended, lower abdomen soft, mild tenderness to epigastric area, lower abdomen, no focal tenderness, negative rebound, negative McBurney's, negative Rovsing. Negative CVA tenderness  Musculoskeletal: She exhibits no edema.  Nontender to palpation of cervical spine, tenderness to palpation of lower thoracic spine midline as well as bilateral thoracic musculature extending towards flanks, lumbar spine with minimal tenderness midline, tenderness to the left lateral lumbar musculature, weakly positive straight leg raise test on left, negative on right  Patient is able to ambulate from chair to exam table without assistance although  slowed  Hip strength 5/5 and equal bilaterally, patellar reflex 2+ bilaterally  Neurological: She is alert.  Skin: Skin is warm and dry.  Psychiatric: She has a normal mood and affect.  Nursing note and vitals reviewed.    UC Treatments / Results  Labs (all labs ordered are listed, but only abnormal results are displayed) Labs Reviewed  POCT URINALYSIS DIP (DEVICE) - Abnormal; Notable for the following components:      Result Value   Ketones, ur TRACE (*)    Hgb urine dipstick SMALL (*)    All other components within normal limits  POCT PREGNANCY, URINE    EKG None  Radiology No results found.  Procedures Procedures (including critical care time)  Medications Ordered in UC Medications - No data to display  Initial Impression / Assessment and Plan / UC Course  I have reviewed the triage vital signs and the nursing notes.  Pertinent labs & imaging results that were available during my care of the patient were reviewed by me and considered in my medical decision making (see chart for details).     Small hemoglobin in urine, and concerning for UTI, given back pain reproducible with palpation, most likely musculoskeletal cause, lumbar strain versus sciatica, does have radiation into the groin.  No red flags for cauda equina.  Strength intact.  Will treat for musculoskeletal cause with anti-inflammatories and muscle relaxer.  Patient also with 1 day of nausea, vomiting and looser stools.  Abdomen slightly distended, negative peritoneal signs, minimal abdominal discomfort, back pain is her main complaint.  Discussed with patient obtaining labs to check liver/pancreas given distention, patient declined and wished to defer given she does not have insurance at this time.  Through shared decision-making discussed with patient treating with Zofran monitoring over the next couple days and having patient follow-up if symptoms worsening, going to emergency room if developing worsening  distention, swelling, pain for further evaluation.  Discussed strict return precautions. Patient verbalized understanding and is agreeable with plan.  Final Clinical Impressions(s) / UC Diagnoses   Final diagnoses:  Non-intractable vomiting with nausea, unspecified vomiting type  Acute left-sided low back pain without sciatica     Discharge Instructions     Your nausea, vomiting, and diarrhea appear to have a viral cause. Your symptoms should improve over the next week as your body continues to rid the infectious cause.  For nausea: Zofran prescribed. Begin with every 6 hours, than as you are able to hold food down, take it as needed. Start with clear liquids, then move to plain foods like bananas, rice, applesauce, toast, broth, grits, oatmeal. As those food settle okay you may transition to your normal foods. Avoid spicy and greasy foods as much as possible.  For Diarrhea: Continue to monitor bowel movements  Preventing dehydration is key! You need to replace the fluid your body is expelling. Drink plenty of fluids, may use Pedialyte or sports drinks.   Please return if you are experiencing blood in your vomit or stool or experiencing dizziness, lightheadedness, extreme fatigue, increased abdominal pain.   Use anti-inflammatories for pain/swelling. You may take up to 800 mg Ibuprofen every 8 hours with food. You may supplement Ibuprofen with Tylenol [phone] mg every 8 hours.   You may use flexeril as needed to help with pain. This is a muscle relaxer and causes sedation- please use only at bedtime or when you will be home and not have to drive/work  Ice heat  Please move around so back does not get stiff/weaker  Follow up if developing pain, weakness, loss of bowel or bladder habits    ED Prescriptions    Medication Sig Dispense Auth. Provider   cyclobenzaprine (FLEXERIL) 10 MG tablet Take 1 tablet (10 mg total) by mouth 2 (two) times daily as needed for muscle spasms. 20  tablet Wieters, [place] C, PA-C     Controlled Substance Prescriptions [place] Controlled Substance Registry consulted? Not Applicable   Lew Dawes, [place] 07/05/18 1652

## 2018-07-05 NOTE — ED Triage Notes (Addendum)
PT reports back pain that started yesterday, emesis, and loose "fluffy" BMs. Vomiting started last night.   Cough for a few weeks as well

## 2018-08-10 ENCOUNTER — Emergency Department (HOSPITAL_COMMUNITY)
Admission: EM | Admit: 2018-08-10 | Discharge: 2018-08-10 | Disposition: A | Discharge status: Home / Self Care | Payer: Self-pay | Attending: Emergency Medicine | Admitting: Emergency Medicine

## 2018-08-10 ENCOUNTER — Emergency Department (HOSPITAL_COMMUNITY): Payer: Self-pay

## 2018-08-10 ENCOUNTER — Other Ambulatory Visit: Payer: Self-pay

## 2018-08-10 DIAGNOSIS — S40922A Unspecified superficial injury of left upper arm, initial encounter: Secondary | ICD-10-CM | POA: Insufficient documentation

## 2018-08-10 DIAGNOSIS — Y929 Unspecified place or not applicable: Secondary | ICD-10-CM | POA: Insufficient documentation

## 2018-08-10 DIAGNOSIS — E119 Type 2 diabetes mellitus without complications: Secondary | ICD-10-CM | POA: Insufficient documentation

## 2018-08-10 DIAGNOSIS — Y939 Activity, unspecified: Secondary | ICD-10-CM | POA: Insufficient documentation

## 2018-08-10 DIAGNOSIS — Y990 Civilian activity done for income or pay: Secondary | ICD-10-CM | POA: Insufficient documentation

## 2018-08-10 DIAGNOSIS — W230XXA Caught, crushed, jammed, or pinched between moving objects, initial encounter: Secondary | ICD-10-CM | POA: Insufficient documentation

## 2018-08-10 DIAGNOSIS — S4992XA Unspecified injury of left shoulder and upper arm, initial encounter: Secondary | ICD-10-CM

## 2018-08-10 MED ORDER — IBUPROFEN 800 MG PO TABS: 800.0000 mg | ORAL_TABLET | Freq: Three times a day (TID) | ORAL | 0 refills | Status: DC

## 2018-08-10 NOTE — ED Provider Notes (Signed)
Medical screening examination/treatment/procedure(s) were conducted as a shared visit with non-physician practitioner(s) and myself.  I personally evaluated the patient during the encounter.  None   Patient is a 42 year old female who presents to the emergency department with left forearm injury.  States a 400 pound weight fell onto her left arm.  She states that a sling caught most of the weight before it hit her.  No fracture or dislocation noted on x-ray.  Compartments are soft.  Reports tingling in her fingertips but normal sensation on examination with normal capillary refill and strong radial pulse.  Does have pain with movement of her fingers and wrist which reproduces pain in the mid forearm.  No sign of compartment syndrome at this time but we did discuss at length return precautions and recommended rest, elevation and ice at home.  Patient comfortable with this plan.   Ward, Layla MawKristen N, DO 08/10/18 [phone]

## 2018-08-10 NOTE — ED Provider Notes (Signed)
[facility] Provider Note   CSN: [id number] Arrival date & time: 08/10/18  0024     History   Chief Complaint No chief complaint on file.   HPI Arie SabinaLaura Leonhardt is a 42 y.o. female.  Patient presents to the emergency department with a chief complaint of left hand injury.  She states that she was at work when a piece of equipment weighing approximately 400 pounds dropped on her left forearm.  She complains of pain and swelling to her forearm and left hand.  Reports some associated tingling sensation in her left small finger.  Has not taken anything for the symptoms.  Symptoms are worsened with movement and palpation.  The history is provided by the patient. No language interpreter was used.    Past Medical History:  Diagnosis Date  . Depression   . Diabetes mellitus without complication (HCC)   . Obesity     There are no active problems to display for this patient.   Past Surgical History:  Procedure Laterality Date  . BREAST SURGERY       OB History   None      Home Medications    Prior to Admission medications   Medication Sig Start Date End Date Taking? Authorizing Provider  cyclobenzaprine (FLEXERIL) 10 MG tablet Take 1 tablet (10 mg total) by mouth 2 (two) times daily as needed for muscle spasms. Patient not taking: Reported on 08/10/2018 07/05/18   Wieters, Junius CreamerHallie C, PA-C  meclizine (ANTIVERT) 25 MG tablet Take 1 tablet (25 mg total) by mouth every 6 (six) hours as needed for dizziness. Patient not taking: Reported on 08/10/2018 10/01/13   Wayland SalinasBednar, John, MD  promethazine (PHENERGAN) 25 MG tablet Take 1 tablet (25 mg total) by mouth every 6 (six) hours as needed for nausea or vomiting. Patient not taking: Reported on 08/10/2018 07/30/13   Bethann BerkshireZammit, Joseph, MD    Family History No family history on file.  Social History Social History   Tobacco Use  . Smoking status: Never Smoker  . Smokeless tobacco: Current User    Substance Use Topics  . Alcohol use: No  . Drug use: No     Allergies   Blueberry flavor; Morphine and related; Raspberry; and Novocain [procaine]   Review of Systems Review of Systems  All other systems reviewed and are negative.    Physical Exam Updated Vital Signs BP (!) 147/114   Pulse 94   Temp 98.2 F (36.8 C) (Oral)   Ht 5\' 1"  (1.549 m)   Wt 104.3 kg   SpO2 98%   BMI 43.46 kg/m   Physical Exam  Constitutional: She is oriented to person, place, and time. She appears well-developed and well-nourished.  HENT:  Head: Normocephalic and atraumatic.  Eyes: Pupils are equal, round, and reactive to light. Conjunctivae and EOM are normal.  Neck: Normal range of motion. Neck supple.  Cardiovascular: Normal rate, regular rhythm and intact distal pulses. Exam reveals no gallop and no friction rub.  No murmur heard. Intact distal pulses, brisk capillary refill  Pulmonary/Chest: Effort normal and breath sounds normal. No respiratory distress. She has no wheezes. She has no rales. She exhibits no tenderness.  Abdominal: Soft. Bowel sounds are normal. She exhibits no distension and no mass. There is no tenderness. There is no rebound and no guarding.  Musculoskeletal: Normal range of motion. She exhibits no edema or tenderness.  Moderate swelling to the distal forearm and left hand, range of motion and  strength limited secondary to pain, no bony abnormality or deformity  Neurological: She is alert and oriented to person, place, and time.  Skin: Skin is warm and dry.  Psychiatric: She has a normal mood and affect. Her behavior is normal. Judgment and thought content normal.  Nursing note and vitals reviewed.    ED Treatments / Results  Labs (all labs ordered are listed, but only abnormal results are displayed) Labs Reviewed - No data to display  EKG None  Radiology Dg Forearm Left  Result Date: 08/10/2018 CLINICAL DATA:  Crush injury. Patient stated she was at work  and a 400 pound bobbin pinned her arm. EXAM: LEFT FOREARM - 2 VIEW COMPARISON:  None. FINDINGS: Cortical margins of the radius and ulna are intact. No acute fracture. Wrist and elbow alignment are maintained. Suspect soft tissue edema about the dorsal forearm. No soft tissue air or radiopaque foreign body. IMPRESSION: Suspect dorsal soft tissue edema. No fracture or dislocation of the left forearm. Electronically Signed   By: Narda Rutherford M.D.   On: 08/10/2018 02:36   Dg Hand Complete Left  Result Date: 08/10/2018 CLINICAL DATA:  Crush injury. Patient stated she was at work and a 400 pound bobbin pinned her arm. EXAM: LEFT HAND - COMPLETE 3+ VIEW COMPARISON:  None. FINDINGS: There is no evidence of fracture or dislocation. There is no evidence of arthropathy or other focal bone abnormality. Soft tissues are unremarkable. IMPRESSION: Negative radiographs of the left hand. Electronically Signed   By: Narda Rutherford M.D.   On: 08/10/2018 02:36    Procedures Procedures (including critical care time)  Medications Ordered in ED Medications - No data to display   Initial Impression / Assessment and Plan / ED Course  I have reviewed the triage vital signs and the nursing notes.  Pertinent labs & imaging results that were available during my care of the patient were reviewed by me and considered in my medical decision making (see chart for details).     Patient with left arm pain after having a crush injury tonight at work.  Plain films are negative.  Patient does have some pain with movement, but she does have intact distal capillary refill and sensation.  She does have some tingling, but sensation is intact.  I doubt compartment syndrome, as the compartments are soft.  Patient seen by and discussed with Dr. Elesa Massed, who agrees compartment syndrome unlikely.  Patient discharged home with strict return precautions.  Final Clinical Impressions(s) / ED Diagnoses   Final diagnoses:  Injury of left  upper extremity, initial encounter    ED Discharge Orders         Ordered    ibuprofen (ADVIL,MOTRIN) 800 MG tablet  3 times daily     08/10/18 0311           Roxy Horseman, PA-C 08/10/18 [phone]

## 2018-08-10 NOTE — ED Triage Notes (Signed)
Patient is [place] at [employer] and was at work this evening when she was injured by dropping a 400 lb metal plate on left forearm. Has sensation in each finger but reporting numbness and tingling from the wrist to the end of the fingertips.

## 2018-08-10 NOTE — ED Notes (Signed)
Discharge instructions reviewed with patient. All questions answered. Patient ambulated to vehicle with belongings 

## 2018-09-14 ENCOUNTER — Emergency Department (HOSPITAL_COMMUNITY)
Admission: EM | Admit: 2018-09-14 | Discharge: 2018-09-15 | Discharge status: Against Medical Advice | Payer: Self-pay | Attending: Emergency Medicine | Admitting: Emergency Medicine

## 2018-09-14 ENCOUNTER — Encounter (HOSPITAL_COMMUNITY): Payer: Self-pay

## 2018-09-14 DIAGNOSIS — R55 Syncope and collapse: Secondary | ICD-10-CM | POA: Insufficient documentation

## 2018-09-14 DIAGNOSIS — R0602 Shortness of breath: Secondary | ICD-10-CM | POA: Insufficient documentation

## 2018-09-14 DIAGNOSIS — Z5321 Procedure and treatment not carried out due to patient leaving prior to being seen by health care provider: Secondary | ICD-10-CM | POA: Insufficient documentation

## 2018-09-14 LAB — BASIC METABOLIC PANEL
ANION GAP: 11 (ref 5–15)
BUN: 10 mg/dL (ref 6–20)
CO2: 23 mmol/L (ref 22–32)
Calcium: 9.5 mg/dL (ref 8.9–10.3)
Chloride: 103 mmol/L (ref 98–111)
Creatinine, Ser: 0.82 mg/dL (ref 0.44–1.00)
GFR calc Af Amer: >60 mL/min (ref >60)
Glucose, Bld: 186 mg/dL — ABNORMAL HIGH (ref 70–99)
POTASSIUM: 2.9 mmol/L — AB (ref 3.5–5.1)
SODIUM: 137 mmol/L (ref 135–145)

## 2018-09-14 LAB — CBG MONITORING, ED: GLUCOSE-CAPILLARY: 157 mg/dL — AB (ref 70–99)

## 2018-09-14 LAB — I-STAT BETA HCG BLOOD, ED (MC, WL, AP ONLY)

## 2018-09-14 LAB — CBC
HCT: 42.5 % (ref 36.0–46.0)
HEMOGLOBIN: 13.4 g/dL (ref 12.0–15.0)
MCH: 27 pg (ref 26.0–34.0)
MCHC: 31.5 g/dL (ref 30.0–36.0)
MCV: 85.7 fL (ref 80.0–100.0)
PLATELETS: 288 10*3/uL (ref 150–400)
RBC: 4.96 MIL/uL (ref 3.87–5.11)
RDW: 13.9 % (ref 11.5–15.5)
WBC: 10.2 10*3/uL (ref 4.0–10.5)
nRBC: 0 % (ref 0.0–0.2)

## 2018-09-14 LAB — I-STAT TROPONIN, ED: Troponin i, poc: 0 ng/mL (ref 0.00–0.08)

## 2018-09-14 NOTE — ED Triage Notes (Signed)
Pt here with ems for syncopal episode while walking into work due to becoming SOB. Pt has hx of bronchitis. Pt given 125 solumedrol, 10 albuterol and 0.5 atrovent. Pt 94% on room air, feels much better now. 20G L Wrist

## 2018-09-15 ENCOUNTER — Emergency Department (HOSPITAL_COMMUNITY): Payer: Self-pay

## 2018-09-15 ENCOUNTER — Emergency Department (HOSPITAL_COMMUNITY)
Admission: EM | Admit: 2018-09-15 | Discharge: 2018-09-15 | Disposition: A | Discharge status: Home / Self Care | Payer: Self-pay | Attending: Emergency Medicine | Admitting: Emergency Medicine

## 2018-09-15 ENCOUNTER — Encounter (HOSPITAL_COMMUNITY): Payer: Self-pay | Admitting: Emergency Medicine

## 2018-09-15 ENCOUNTER — Other Ambulatory Visit: Payer: Self-pay

## 2018-09-15 DIAGNOSIS — R0602 Shortness of breath: Secondary | ICD-10-CM | POA: Insufficient documentation

## 2018-09-15 DIAGNOSIS — J40 Bronchitis, not specified as acute or chronic: Secondary | ICD-10-CM

## 2018-09-15 DIAGNOSIS — E119 Type 2 diabetes mellitus without complications: Secondary | ICD-10-CM | POA: Insufficient documentation

## 2018-09-15 LAB — I-STAT CHEM 8, ED
BUN: 9 mg/dL (ref 6–20)
CALCIUM ION: 1.23 mmol/L (ref 1.15–1.40)
CHLORIDE: 106 mmol/L (ref 98–111)
Creatinine, Ser: 0.6 mg/dL (ref 0.44–1.00)
GLUCOSE: 140 mg/dL — AB (ref 70–99)
HEMATOCRIT: 44 % (ref 36.0–46.0)
HEMOGLOBIN: 15 g/dL (ref 12.0–15.0)
POTASSIUM: 4 mmol/L (ref 3.5–5.1)
Sodium: 138 mmol/L (ref 135–145)
TCO2: 21 mmol/L — AB (ref 22–32)

## 2018-09-15 LAB — I-STAT TROPONIN, ED: TROPONIN I, POC: 0.01 ng/mL (ref 0.00–0.08)

## 2018-09-15 MED ORDER — ALBUTEROL SULFATE HFA 108 (90 BASE) MCG/ACT IN AERS
2.0000 | INHALATION_SPRAY | RESPIRATORY_TRACT | Status: DC
  Administered 2018-09-15: 2 via RESPIRATORY_TRACT
  Filled 2018-09-15: qty 6.7

## 2018-09-15 MED ORDER — POTASSIUM CHLORIDE CRYS ER 20 MEQ PO TBCR
40.0000 meq | EXTENDED_RELEASE_TABLET | Freq: Once | ORAL | Status: AC
  Administered 2018-09-15: 40 meq via ORAL
  Filled 2018-09-15: qty 2

## 2018-09-15 MED ORDER — AMOXICILLIN 500 MG PO CAPS: 500.0000 mg | ORAL_CAPSULE | Freq: Three times a day (TID) | ORAL | 0 refills | Status: DC

## 2018-09-15 NOTE — ED Triage Notes (Signed)
Pt states she has been feeling SOB for about two weeks. Pt had syncopal episode while walking into work last night due to feeling so SOB, the walk into work was about a mile. Pt has hx of bronchitis and believes this is a flare up. Pt came to ED last night but left due to long wait. Labs available in computer.

## 2018-09-15 NOTE — ED Provider Notes (Signed)
[facility]   CSN: [id number] Arrival date & time: 09/15/18  1003     History   Chief Complaint Chief Complaint  Patient presents with  . Shortness of Breath    HPI Arie SabinaLaura Klapper is a 43 y.o. female.  The history is provided by the patient. No language interpreter was used.  Shortness of Breath  This is a new problem. The average episode lasts 2 weeks. The problem occurs continuously.The current episode started more than 1 week ago. The problem has not changed since onset.Pertinent negatives include no fever and no sputum production. She has tried nothing for the symptoms. The treatment provided no relief. Associated medical issues do not include chronic lung disease.  Pt reports she has been short of breath for several days.  Pt reports she was walking yesterday and became short of breath and passed out.  Pt reports she felt better after breathing treatment.  Pt reports she had the same in the past.  Pt reports she has had asthma in the past   Past Medical History:  Diagnosis Date  . Depression   . Diabetes mellitus without complication (HCC)   . Obesity     There are no active problems to display for this patient.   Past Surgical History:  Procedure Laterality Date  . BREAST SURGERY       OB History   No obstetric history on file.      Home Medications    Prior to Admission medications   Medication Sig Start Date End Date Taking? Authorizing Provider  cyclobenzaprine (FLEXERIL) 10 MG tablet Take 1 tablet (10 mg total) by mouth 2 (two) times daily as needed for muscle spasms. Patient not taking: Reported on 08/10/2018 07/05/18   Wieters, Hallie C, PA-C  ibuprofen (ADVIL,MOTRIN) 800 MG tablet Take 1 tablet (800 mg total) by mouth 3 (three) times daily. 08/10/18   Roxy HorsemanBrowning, Robert, PA-C  meclizine (ANTIVERT) 25 MG tablet Take 1 tablet (25 mg total) by mouth every 6 (six) hours as needed for dizziness. Patient  not taking: Reported on 08/10/2018 10/01/13   Wayland SalinasBednar, John, MD  promethazine (PHENERGAN) 25 MG tablet Take 1 tablet (25 mg total) by mouth every 6 (six) hours as needed for nausea or vomiting. Patient not taking: Reported on 08/10/2018 07/30/13   Bethann BerkshireZammit, Joseph, MD    Family History History reviewed. No pertinent family history.  Social History Social History   Tobacco Use  . Smoking status: Never Smoker  . Smokeless tobacco: Current User    Types: Chew  Substance Use Topics  . Alcohol use: No  . Drug use: No     Allergies   Blueberry flavor; Morphine and related; Raspberry; and Novocain [procaine]   Review of Systems Review of Systems  Constitutional: Negative for fever.  Respiratory: Positive for shortness of breath. Negative for sputum production.   All other systems reviewed and are negative.    Physical Exam Updated Vital Signs BP (!) 151/91 (BP Location: Right Arm)   Pulse 84   Temp 97.9 F (36.6 C) (Oral)   Resp 16   Ht 5' (1.524 m)   Wt 104.3 kg   LMP 09/13/2018   SpO2 95%   BMI 44.92 kg/m   Physical Exam Vitals signs and nursing note reviewed.  Constitutional:      Appearance: She is well-developed.  HENT:     Head: Normocephalic.     Mouth/Throat:     Mouth: Mucous membranes  are moist.  Eyes:     Extraocular Movements: Extraocular movements intact.     Pupils: Pupils are equal, round, and reactive to light.  Neck:     Musculoskeletal: Normal range of motion.  Cardiovascular:     Rate and Rhythm: Normal rate.  Pulmonary:     Effort: Pulmonary effort is normal.  Chest:     Chest wall: No mass.  Abdominal:     General: There is no distension.  Musculoskeletal: Normal range of motion.  Skin:    General: Skin is warm.  Neurological:     Mental Status: She is alert and oriented to person, place, and time.  Psychiatric:        Mood and Affect: Mood normal.      ED Treatments / Results  Labs (all labs ordered are listed, but only  abnormal results are displayed) Labs Reviewed - No data to display  EKG None  Radiology Dg Chest 2 View  Result Date: 09/15/2018 CLINICAL DATA:  Shortness of breath. Wheezing. EXAM: CHEST - 2 VIEW COMPARISON:  07/30/2013 FINDINGS: Heart size and mediastinal shadows are normal. There may be mild central bronchial thickening but there is no infiltrate, collapse or effusion. No significant bone finding. IMPRESSION: Possible bronchitis. No consolidation or collapse. Electronically Signed   By: Paulina Fusi M.D.   On: 09/15/2018 10:46    Procedures Procedures (including critical care time)  Medications Ordered in ED Medications - No data to display   Initial Impression / Assessment and Plan / ED Course  I have reviewed the triage vital signs and the nursing notes.  Pertinent labs & imaging results that were available during my care of the patient were reviewed by me and considered in my medical decision making (see chart for details).     MDM  EKG and labs reviewed from last pm.  Repeat troponin an Istat 8 normal today.  Pt counseled on results.  Pt given rx for amoxicillian and albuterol.  Pt advised to follow up with [facility]   Final Clinical Impressions(s) / ED Diagnoses   Final diagnoses:  None    ED Discharge Orders    None       Osie Cheeks 09/15/18 1428    Benjiman Core, MD 09/15/18 1535

## 2019-03-01 ENCOUNTER — Emergency Department: Payer: Self-pay

## 2019-03-01 ENCOUNTER — Emergency Department
Admission: EM | Admit: 2019-03-01 | Discharge: 2019-03-02 | Disposition: A | Discharge status: Home / Self Care | Payer: Self-pay | Attending: Emergency Medicine | Admitting: Emergency Medicine

## 2019-03-01 ENCOUNTER — Other Ambulatory Visit: Payer: Self-pay

## 2019-03-01 DIAGNOSIS — J45901 Unspecified asthma with (acute) exacerbation: Secondary | ICD-10-CM | POA: Insufficient documentation

## 2019-03-01 DIAGNOSIS — E119 Type 2 diabetes mellitus without complications: Secondary | ICD-10-CM | POA: Insufficient documentation

## 2019-03-01 MED ORDER — IPRATROPIUM-ALBUTEROL 0.5-2.5 (3) MG/3ML IN SOLN
3.0000 mL | Freq: Once | RESPIRATORY_TRACT | Status: AC
  Administered 2019-03-02: 3 mL via RESPIRATORY_TRACT
  Filled 2019-03-01: qty 3

## 2019-03-01 NOTE — ED Triage Notes (Signed)
Pt arrives to ED from work via [facility] with c/c of shortness of breath. Pt reports hx of asthma and believes this is an asthma attack. EMS reports transport vitals of 166/106, p87, O2 sat 97% on room air, oral temp 98.0. Upon arrival, pt A&O x4, NAD, no respiratory distress evident.

## 2019-03-02 LAB — CBC WITH DIFFERENTIAL/PLATELET
Abs Immature Granulocytes: 0.04 10*3/uL (ref 0.00–0.07)
Basophils Absolute: 0 10*3/uL (ref 0.0–0.1)
Basophils Relative: 0 %
Eosinophils Absolute: 0.1 10*3/uL (ref 0.0–0.5)
Eosinophils Relative: 1 %
HCT: 35.8 % — ABNORMAL LOW (ref 36.0–46.0)
Hemoglobin: 11.7 g/dL — ABNORMAL LOW (ref 12.0–15.0)
Immature Granulocytes: 0 %
Lymphocytes Relative: 27 %
Lymphs Abs: 2.7 10*3/uL (ref 0.7–4.0)
MCH: 27.8 pg (ref 26.0–34.0)
MCHC: 32.7 g/dL (ref 30.0–36.0)
MCV: 85 fL (ref 80.0–100.0)
Monocytes Absolute: 0.8 10*3/uL (ref 0.1–1.0)
Monocytes Relative: 8 %
Neutro Abs: 6.5 10*3/uL (ref 1.7–7.7)
Neutrophils Relative %: 64 %
Platelets: 231 10*3/uL (ref 150–400)
RBC: 4.21 MIL/uL (ref 3.87–5.11)
RDW: 14 % (ref 11.5–15.5)
WBC: 10.1 10*3/uL (ref 4.0–10.5)
nRBC: 0 % (ref 0.0–0.2)

## 2019-03-02 LAB — COMPREHENSIVE METABOLIC PANEL
ALT: 18 U/L (ref 0–44)
AST: 18 U/L (ref 15–41)
Albumin: 3.9 g/dL (ref 3.5–5.0)
Alkaline Phosphatase: 56 U/L (ref 38–126)
Anion gap: 8 (ref 5–15)
BUN: 20 mg/dL (ref 6–20)
CO2: 25 mmol/L (ref 22–32)
Calcium: 8.9 mg/dL (ref 8.9–10.3)
Chloride: 105 mmol/L (ref 98–111)
Creatinine, Ser: 0.83 mg/dL (ref 0.44–1.00)
GFR calc Af Amer: >60 mL/min (ref >60)
GFR calc non Af Amer: >60 mL/min (ref >60)
Glucose, Bld: 104 mg/dL — ABNORMAL HIGH (ref 70–99)
Potassium: 4.1 mmol/L (ref 3.5–5.1)
Sodium: 138 mmol/L (ref 135–145)
Total Bilirubin: 0.5 mg/dL (ref 0.3–1.2)
Total Protein: 6.7 g/dL (ref 6.5–8.1)

## 2019-03-02 LAB — TROPONIN I: Troponin I: <0.03 ng/mL (ref <0.03)

## 2019-03-02 NOTE — ED Provider Notes (Signed)
[facility] Emergency Department Provider Note   ____________________________________________    I have reviewed the triage vital signs and the nursing notes.   HISTORY  Chief Complaint Shortness of breath and dizziness    HPI Darlene Guzman is a 43 y.o. female who presents with reports of shortness of breath and dizziness.  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  However shortly after she started to feel little bit dizzy and lightheaded, this improved when sitting down.  She denies any chest pain shortness of breath pleurisy.  No fevers or chills.  No myalgias.  No COVID exposures that she knows of.  Past Medical History:  Diagnosis Date  . Depression   . Diabetes mellitus without complication (HCC)   . Obesity     There are no active problems to display for this patient.   Past Surgical History:  Procedure Laterality Date  . BREAST SURGERY      Prior to Admission medications   Medication Sig Start Date End Date Taking? Authorizing Provider  amoxicillin (AMOXIL) 500 MG capsule Take 1 capsule (500 mg total) by mouth 3 (three) times daily. 09/15/18   Elson AreasSofia, Leslie K, PA-C     Allergies Blueberry flavor, Morphine and related, Raspberry, and Novocain [procaine]  History reviewed. No pertinent family history.  Social History Social History   Tobacco Use  . Smoking status: Never Smoker  . Smokeless tobacco: Current User    Types: Chew  Substance Use Topics  . Alcohol use: No  . Drug use: No    Review of Systems  Constitutional: No fever/chills Eyes: No visual changes.  ENT: No sore throat. Cardiovascular: Denies chest pain. Respiratory: As above Gastrointestinal: No abdominal pain.  No nausea, no vomiting.   Genitourinary: Negative for dysuria. Musculoskeletal: Negative for back pain.  Skin: Negative for rash. Neurological: Negative for headaches or weakness   ____________________________________________   PHYSICAL EXAM:  VITAL SIGNS: ED Triage Vitals  Enc Vitals Group     BP 03/01/19 2243 128/81     Pulse Rate 03/01/19 2243 76     Resp 03/01/19 2243 14     Temp 03/01/19 2243 98.2 F (36.8 C)     Temp src --      SpO2 03/01/19 2243 97 %     Weight 03/01/19 2246 99.8 kg (220 lb)     Height 03/01/19 2246 1.53 m (5' 0.25")     Head Circumference --      Peak Flow --      Pain Score 03/01/19 2243 0     Pain Loc --      Pain Edu? --      Excl. in GC? --     Constitutional: Alert and oriented. No acute distress. Pleasant and interactive Eyes: Conjunctivae are normal.  Head: Atraumatic. Nose: No congestion/rhinnorhea. Mouth/Throat: Mucous membranes are moist.   Neck:  Painless ROM Cardiovascular: Normal rate, regular rhythm. Grossly normal heart sounds.  Good peripheral circulation. Respiratory: Normal respiratory effort.  No retractions. Lungs CTAB. Gastrointestinal: Soft and nontender. No distention.  No CVA tenderness. Genitourinary: deferred Musculoskeletal: No lower extremity tenderness nor edema.  Warm and well perfused Neurologic:  Normal speech and language. No gross focal neurologic deficits are appreciated.  Skin:  Skin is warm, dry and intact. No rash noted. Psychiatric: Mood and affect are normal.  Speech and behavior are normal.  ____________________________________________   LABS (all labs ordered are listed, but only abnormal results are displayed)  Labs Reviewed  CBC WITH DIFFERENTIAL/PLATELET  COMPREHENSIVE METABOLIC PANEL  TROPONIN I   ____________________________________________  EKG  ED ECG REPORT I, Lavonia Drafts, the attending physician, personally viewed and interpreted this ECG.  Date: 03/02/2019  Rhythm: normal sinus rhythm QRS Axis: normal Intervals: normal ST/T Wave abnormalities: normal Narrative  Interpretation: no evidence of acute ischemia  ____________________________________________  RADIOLOGY  X-ray unremarkable ____________________________________________   PROCEDURES  Procedure(s) performed: No  Procedures   Critical Care performed: No ____________________________________________   INITIAL IMPRESSION / ASSESSMENT AND PLAN / ED COURSE  Pertinent labs & imaging results that were available during my care of the patient were reviewed by me and considered in my medical decision making (see chart for details).  Patient overall well-appearing in no acute distress, vitals normal, exam normal.  Do suspect mild asthma exacerbation as the cause of her shortness of breath, we will give an additional DuoNeb here.  She may have had dizziness related to exacerbation.  Currently is quite comfortable with no dizziness.  Asymptomatic.  We will obtain chest x-ray, labs and reevaluate, anticipate discharge   ____________________________________________   FINAL CLINICAL IMPRESSION(S) / ED DIAGNOSES  Final diagnoses:  Exacerbation of asthma, unspecified asthma severity, unspecified whether persistent        Note:  This document was prepared using Dragon voice recognition software and may include unintentional dictation errors.   Lavonia Drafts, MD 03/02/19 [phone]

## 2019-03-02 NOTE — ED Notes (Signed)
Breathing treatment complete. Turned off by this tech. [place]

## 2019-04-02 ENCOUNTER — Emergency Department
Admission: EM | Admit: 2019-04-02 | Discharge: 2019-04-02 | Disposition: A | Discharge status: Home / Self Care | Payer: Self-pay | Attending: Emergency Medicine | Admitting: Emergency Medicine

## 2019-04-02 ENCOUNTER — Other Ambulatory Visit: Payer: Self-pay

## 2019-04-02 ENCOUNTER — Encounter: Payer: Self-pay | Admitting: Emergency Medicine

## 2019-04-02 DIAGNOSIS — J4541 Moderate persistent asthma with (acute) exacerbation: Secondary | ICD-10-CM | POA: Insufficient documentation

## 2019-04-02 DIAGNOSIS — Z79899 Other long term (current) drug therapy: Secondary | ICD-10-CM | POA: Insufficient documentation

## 2019-04-02 DIAGNOSIS — E119 Type 2 diabetes mellitus without complications: Secondary | ICD-10-CM | POA: Insufficient documentation

## 2019-04-02 MED ORDER — MONTELUKAST SODIUM 10 MG PO TABS: 10.0000 mg | ORAL_TABLET | Freq: Every day | ORAL | 5 refills | Status: DC

## 2019-04-02 MED ORDER — PREDNISONE 50 MG PO TABS: 50.0000 mg | ORAL_TABLET | Freq: Every day | ORAL | 0 refills | Status: DC

## 2019-04-02 MED ORDER — ALBUTEROL SULFATE HFA 108 (90 BASE) MCG/ACT IN AERS: 2.0000 | INHALATION_SPRAY | RESPIRATORY_TRACT | 3 refills | Status: DC | PRN

## 2019-04-02 MED ORDER — PULMICORT FLEXHALER 90 MCG/ACT IN AEPB: 1.0000 | INHALATION_SPRAY | Freq: Two times a day (BID) | RESPIRATORY_TRACT | 5 refills | Status: DC

## 2019-04-02 NOTE — ED Provider Notes (Signed)
[facility] Emergency Department Provider Note  ____________________________________________  Time seen: Approximately 9:36 PM  I have reviewed the triage vital signs and the nursing notes.   HISTORY  Chief Complaint Asthma    HPI Darlene Guzman is a 43 y.o. female who presents the emergency department for evaluation of an asthma exacerbation.  Patient states that she has chronic asthma problems with exacerbation in high settings.  Patient reports that she was at work [employer] and a poorly air conditioned building.  Patient reports that the heat caused her to have an asthma exacerbation.  Patient reports that she took her rescue inhaler and has complete resolutions of symptoms at this time but was sent to the emergency department by her employer to ensure she was "okay."  Patient denies any complaints at this time and states that she would like to have a note to return to work tomorrow.  Patient has recently moved to the area, asked for multiple recommendations in regards to primary care.  After lengthy discussion, it appears that patient has been having daily symptoms given her heat exposure.  She is never been on any other medication other than rescue inhaler.  Patient is amenable to trying other options to further manage her asthma symptoms.  Has a history of depression, diabetes, obesity, asthma.  No complaints of chronic medical problems other than asthma.         Past Medical History:  Diagnosis Date  . Depression   . Diabetes mellitus without complication (HCC)   . Obesity     There are no active problems to display for this patient.   Past Surgical History:  Procedure Laterality Date  . BREAST SURGERY      Prior to Admission medications   Medication Sig Start Date End Date Taking? Authorizing Provider  albuterol (VENTOLIN HFA) 108 (90 Base) MCG/ACT inhaler Inhale 2 puffs into the lungs every 4 (four) hours as needed for wheezing or shortness of  breath. 04/02/19   Inioluwa Baris, Delorise RoyalsJonathan D, PA-C  amoxicillin (AMOXIL) 500 MG capsule Take 1 capsule (500 mg total) by mouth 3 (three) times daily. 09/15/18   Elson AreasSofia, Leslie K, PA-C  Budesonide (PULMICORT FLEXHALER) 90 MCG/ACT inhaler Inhale 1 puff into the lungs 2 (two) times daily. 04/02/19   Deangela Randleman, Delorise RoyalsJonathan D, PA-C  montelukast (SINGULAIR) 10 MG tablet Take 1 tablet (10 mg total) by mouth daily. 04/02/19 04/01/20  Felix Meras, Delorise RoyalsJonathan D, PA-C  predniSONE (DELTASONE) 50 MG tablet Take 1 tablet (50 mg total) by mouth daily with breakfast. 04/02/19   Italia Wolfert, Delorise RoyalsJonathan D, PA-C    Allergies Blueberry flavor, Morphine and related, Raspberry, and Novocain [procaine]  No family history on file.  Social History Social History   Tobacco Use  . Smoking status: Never Smoker  . Smokeless tobacco: Current User    Types: Chew  Substance Use Topics  . Alcohol use: No  . Drug use: No     Review of Systems  Constitutional: No fever/chills Eyes: No visual changes. No discharge ENT: No upper respiratory complaints. Cardiovascular: no chest pain. Respiratory: no cough.  Positive for shortness of breath and chest tightness that resolved after use of her rescue inhaler Gastrointestinal: No abdominal pain.  No nausea, no vomiting.  No diarrhea.  No constipation. Musculoskeletal: Negative for musculoskeletal pain. Skin: Negative for rash, abrasions, lacerations, ecchymosis. Neurological: Negative for headaches, focal weakness or numbness. 10-point ROS otherwise negative.  ____________________________________________   PHYSICAL EXAM:  VITAL SIGNS: ED Triage Vitals  Enc Vitals  Group     BP 04/02/19 1906 (!) 145/62     Pulse Rate 04/02/19 1906 85     Resp 04/02/19 1906 18     Temp 04/02/19 1906 98.6 F (37 C)     Temp Source 04/02/19 1906 Oral     SpO2 04/02/19 1906 97 %     Weight 04/02/19 1905 225 lb (102.1 kg)     Height 04/02/19 1905 5' (1.524 m)     Head Circumference --      Peak Flow  --      Pain Score 04/02/19 1905 0     Pain Loc --      Pain Edu? --      Excl. in GC? --      Constitutional: Alert and oriented. Well appearing and in no acute distress. Eyes: Conjunctivae are normal. PERRL. EOMI. Head: Atraumatic. ENT:      Ears:       Nose: No congestion/rhinnorhea.      Mouth/Throat: Mucous membranes are moist.  Neck: No stridor.    Cardiovascular: Normal rate, regular rhythm. Normal S1 and S2.  Good peripheral circulation. Respiratory: Normal respiratory effort without tachypnea or retractions. Lungs CTAB. Good air entry to the bases with no decreased or absent breath sounds. Musculoskeletal: Full range of motion to all extremities. No gross deformities appreciated. Neurologic:  Normal speech and language. No gross focal neurologic deficits are appreciated.  Skin:  Skin is warm, dry and intact. No rash noted. Psychiatric: Mood and affect are normal. Speech and behavior are normal. Patient exhibits appropriate insight and judgement.   ____________________________________________   LABS (all labs ordered are listed, but only abnormal results are displayed)  Labs Reviewed - No data to display ____________________________________________  EKG   ____________________________________________  RADIOLOGY   No results found.  ____________________________________________    PROCEDURES  Procedure(s) performed:    Procedures    Medications - No data to display   ____________________________________________   INITIAL IMPRESSION / ASSESSMENT AND PLAN / ED COURSE  Pertinent labs & imaging results that were available during my care of the patient were reviewed by me and considered in my medical decision making (see chart for details).  Review of the [place] CSRS was performed in accordance of the [facility] prior to dispensing any controlled drugs.           Patient's diagnosis is consistent with moderate persistent asthma with exacerbation.  Patient  presented to the emergency department after asthma exacerbation at work.  Patient is completely asymptomatic at this time, was referred to the emergency department for evaluation before the patient could return to work.  She is requesting return to work clearance at this time.  Patient is stable, with reassuring exam.  After discussion with patient, patient is establishing in this area as she just recently moved here from another state for work.  It appears that patient does have moderate persistent asthma.  As such I will place the patient on for the medication other than just her rescue inhaler.  After discussion, patient does not have insurance, is very concerned regarding the cost.  While the next reasonable step would be an inhaled corticosteroid, these are price prohibitive for $3-$400 an inhaler.  While I will prescribe the patient Pulmicort, the least expensive of the inhaled corticosteroid medications, I will try the patient on Singulair tablet as this is a much more affordable option for the patient.  I have advised the patient that this is not the  next step in typical asthma treatment but when faced with no medication versus singular, I feel that Singulair would be a good alternative.  Patient is also prescribed repeat albuterol inhaler, as well as a short course of prednisone if patient feels that symptoms return during the night and she needs to use her rescue inhaler again after this asthma exacerbation.  Patient is given information regarding primary care including the [facility] here.  Patient is also given information for medication management that may assist her in securing a better price for an inhaled corticosteroid..  Patient is given ED precautions to return to the ED for any worsening or new symptoms.     ____________________________________________  FINAL CLINICAL IMPRESSION(S) / ED DIAGNOSES  Final diagnoses:  Moderate persistent asthma with exacerbation      NEW  MEDICATIONS STARTED DURING THIS VISIT:  ED Discharge Orders         Ordered    albuterol (VENTOLIN HFA) 108 (90 Base) MCG/ACT inhaler  Every 4 hours PRN     04/02/19 2135    montelukast (SINGULAIR) 10 MG tablet  Daily     04/02/19 2135    Budesonide (PULMICORT FLEXHALER) 90 MCG/ACT inhaler  2 times daily     04/02/19 2135    predniSONE (DELTASONE) 50 MG tablet  Daily with breakfast     04/02/19 2135              This chart was dictated using voice recognition software/Dragon. Despite best efforts to proofread, errors can occur which can change the meaning. Any change was purely unintentional.    Darletta Moll, PA-C 04/02/19 2142    Nance Pear, MD 04/02/19 2225

## 2019-04-02 NOTE — ED Triage Notes (Addendum)
Patient ambulatory to triage with steady gait, without difficulty or distress noted, mask in place; pt reports nonprod cough & wheezing unrelieved by her inhaler for asthma; no wheezing auscultated at this time

## 2019-06-07 ENCOUNTER — Encounter: Payer: Self-pay | Admitting: Intensive Care

## 2019-06-07 ENCOUNTER — Emergency Department
Admission: EM | Admit: 2019-06-07 | Discharge: 2019-06-07 | Disposition: A | Discharge status: Home / Self Care | Payer: Self-pay | Attending: Emergency Medicine | Admitting: Emergency Medicine

## 2019-06-07 ENCOUNTER — Emergency Department: Payer: Self-pay

## 2019-06-07 ENCOUNTER — Other Ambulatory Visit: Payer: Self-pay

## 2019-06-07 DIAGNOSIS — K2900 Acute gastritis without bleeding: Secondary | ICD-10-CM | POA: Insufficient documentation

## 2019-06-07 DIAGNOSIS — Z79899 Other long term (current) drug therapy: Secondary | ICD-10-CM | POA: Insufficient documentation

## 2019-06-07 DIAGNOSIS — E119 Type 2 diabetes mellitus without complications: Secondary | ICD-10-CM | POA: Insufficient documentation

## 2019-06-07 DIAGNOSIS — F17220 Nicotine dependence, chewing tobacco, uncomplicated: Secondary | ICD-10-CM | POA: Insufficient documentation

## 2019-06-07 DIAGNOSIS — E860 Dehydration: Secondary | ICD-10-CM | POA: Insufficient documentation

## 2019-06-07 LAB — BASIC METABOLIC PANEL
Anion gap: 11 (ref 5–15)
BUN: 13 mg/dL (ref 6–20)
CO2: 24 mmol/L (ref 22–32)
Calcium: 8.7 mg/dL — ABNORMAL LOW (ref 8.9–10.3)
Chloride: 102 mmol/L (ref 98–111)
Creatinine, Ser: 0.74 mg/dL (ref 0.44–1.00)
GFR calc Af Amer: >60 mL/min (ref >60)
GFR calc non Af Amer: >60 mL/min (ref >60)
Glucose, Bld: 151 mg/dL — ABNORMAL HIGH (ref 70–99)
Potassium: 3.8 mmol/L (ref 3.5–5.1)
Sodium: 137 mmol/L (ref 135–145)

## 2019-06-07 LAB — HEPATIC FUNCTION PANEL
ALT: 16 U/L (ref 0–44)
AST: 16 U/L (ref 15–41)
Albumin: 3.4 g/dL — ABNORMAL LOW (ref 3.5–5.0)
Alkaline Phosphatase: 54 U/L (ref 38–126)
Bilirubin, Direct: <0.1 mg/dL (ref 0.0–0.2)
Total Bilirubin: 0.5 mg/dL (ref 0.3–1.2)
Total Protein: 5.9 g/dL — ABNORMAL LOW (ref 6.5–8.1)

## 2019-06-07 LAB — CBC
HCT: 35.6 % — ABNORMAL LOW (ref 36.0–46.0)
Hemoglobin: 11.7 g/dL — ABNORMAL LOW (ref 12.0–15.0)
MCH: 28.2 pg (ref 26.0–34.0)
MCHC: 32.9 g/dL (ref 30.0–36.0)
MCV: 85.8 fL (ref 80.0–100.0)
Platelets: 258 10*3/uL (ref 150–400)
RBC: 4.15 MIL/uL (ref 3.87–5.11)
RDW: 14.1 % (ref 11.5–15.5)
WBC: 13 10*3/uL — ABNORMAL HIGH (ref 4.0–10.5)
nRBC: 0 % (ref 0.0–0.2)

## 2019-06-07 LAB — LIPASE, BLOOD: Lipase: 33 U/L (ref 11–51)

## 2019-06-07 LAB — TROPONIN I (HIGH SENSITIVITY): Troponin I (High Sensitivity): 6 ng/L (ref <18)

## 2019-06-07 MED ORDER — FAMOTIDINE 20 MG PO TABS: 20.0000 mg | ORAL_TABLET | Freq: Two times a day (BID) | ORAL | 0 refills | Status: DC

## 2019-06-07 MED ORDER — ALUM & MAG HYDROXIDE-SIMETH 200-200-20 MG/5ML PO SUSP
30.0000 mL | Freq: Once | ORAL | Status: AC
  Administered 2019-06-07: 30 mL via ORAL
  Filled 2019-06-07: qty 30

## 2019-06-07 MED ORDER — METOCLOPRAMIDE HCL 10 MG PO TABS
10.0000 mg | ORAL_TABLET | Freq: Once | ORAL | Status: AC
  Administered 2019-06-07: 10 mg via ORAL
  Filled 2019-06-07: qty 1

## 2019-06-07 MED ORDER — ALUMINUM-MAGNESIUM-SIMETHICONE 200-200-20 MG/5ML PO SUSP: 30.0000 mL | Freq: Three times a day (TID) | ORAL | 0 refills | Status: DC

## 2019-06-07 MED ORDER — FAMOTIDINE 20 MG PO TABS
40.0000 mg | ORAL_TABLET | Freq: Once | ORAL | Status: AC
  Administered 2019-06-07: 40 mg via ORAL
  Filled 2019-06-07: qty 2

## 2019-06-07 MED ORDER — PREDNISONE 20 MG PO TABS: 40.0000 mg | ORAL_TABLET | Freq: Every day | ORAL | 0 refills | Status: DC

## 2019-06-07 NOTE — ED Notes (Signed)
Patient given crackers and water, tolerated well.

## 2019-06-07 NOTE — ED Notes (Signed)
Called lab and spoke with Murray Hodgkins and she will add on new lab tests MD ordered.

## 2019-06-07 NOTE — ED Triage Notes (Signed)
Patient c/o "pulled muscles throughout my chest" for a couple of days with exhaustion. C/o tingling in arms and hands and reports getting SOB easily. A&O x4 in triage

## 2019-06-07 NOTE — ED Provider Notes (Signed)
[facility] Emergency Department Provider Note  ____________________________________________  Time seen: Approximately 9:34 PM  I have reviewed the triage vital signs and the nursing notes.   HISTORY  Chief Complaint Chest Pain    HPI Darlene Guzman is a 43 y.o. female with a history of depression obesity and diabetes who comes the ED complaining of chest tightness for the past 2 days associated fatigue, upper abdominal pain, and decreased appetite.  She was in her usual state of health until she went to donate plasma which she has done many times in the past.  She states that they were having a hard time with IV access so they were able to extract her whole blood, but unable to obtain a line to give back her filtered red blood cells.  Afterward she felt very bad.  Denies any sick contacts.  Denies vomiting diarrhea fevers or chills.  No body aches.  No aggravating or alleviating factors, chest pain is nonexertional, not pleuritic.   Overall feels like an aggravation of her asthma.     Past Medical History:  Diagnosis Date  . Depression   . Diabetes mellitus without complication (HCC)   . Obesity      There are no active problems to display for this patient.    Past Surgical History:  Procedure Laterality Date  . BREAST SURGERY       Prior to Admission medications   Medication Sig Start Date End Date Taking? Authorizing Provider  albuterol (VENTOLIN HFA) 108 (90 Base) MCG/ACT inhaler Inhale 2 puffs into the lungs every 4 (four) hours as needed for wheezing or shortness of breath. 04/02/19   Cuthriell, Delorise Royals, PA-C  aluminum-magnesium hydroxide-simethicone (MAALOX) 200-200-20 MG/5ML SUSP Take 30 mLs by mouth 4 (four) times daily -  before meals and at bedtime. 06/07/19   Sharman Cheek, MD  amoxicillin (AMOXIL) 500 MG capsule Take 1 capsule (500 mg total) by mouth 3 (three) times daily. 09/15/18   Elson Areas, PA-C  Budesonide (PULMICORT  FLEXHALER) 90 MCG/ACT inhaler Inhale 1 puff into the lungs 2 (two) times daily. 04/02/19   Cuthriell, Delorise Royals, PA-C  famotidine (PEPCID) 20 MG tablet Take 1 tablet (20 mg total) by mouth 2 (two) times daily. 06/07/19   Sharman Cheek, MD  montelukast (SINGULAIR) 10 MG tablet Take 1 tablet (10 mg total) by mouth daily. 04/02/19 04/01/20  Cuthriell, Delorise Royals, PA-C  predniSONE (DELTASONE) 20 MG tablet Take 2 tablets (40 mg total) by mouth daily. 06/07/19   Sharman Cheek, MD     Allergies Blueberry flavor, Lidocaine, Morphine and related, Raspberry, and Novocain [procaine]   History reviewed. No pertinent family history.  Social History Social History   Tobacco Use  . Smoking status: Never Smoker  . Smokeless tobacco: Current User    Types: Chew  Substance Use Topics  . Alcohol use: No  . Drug use: No    Review of Systems  Constitutional:   No fever or chills.  ENT:   No sore throat. No rhinorrhea. Cardiovascular:   Positive as above chest tightness syncope. Respiratory:   Positive shortness of breath with nonproductive cough. Gastrointestinal:   Negative for abdominal pain, vomiting and diarrhea.  Musculoskeletal:   Negative for focal pain or swelling All other systems reviewed and are negative except as documented above in ROS and HPI.  ____________________________________________   PHYSICAL EXAM:  VITAL SIGNS: ED Triage Vitals  Enc Vitals Group     BP 06/07/19 1751 133/70  Pulse Rate 06/07/19 1751 96     Resp 06/07/19 1751 14     Temp 06/07/19 1751 98.2 F (36.8 C)     Temp Source 06/07/19 1751 Oral     SpO2 06/07/19 1751 98 %     Weight 06/07/19 1752 220 lb (99.8 kg)     Height 06/07/19 1752 5' (1.524 m)     Head Circumference --      Peak Flow --      Pain Score 06/07/19 1752 5     Pain Loc --      Pain Edu? --      Excl. in [place]? --     Vital signs reviewed, nursing assessments reviewed.   Constitutional:   Alert and oriented. Non-toxic  appearance. Eyes:   Conjunctivae are normal. EOMI. PERRL. ENT      Head:   Normocephalic and atraumatic.      Nose:   Wearing a mask.      Mouth/Throat:   Wearing a mask.      Neck:   No meningismus. Full ROM. Hematological/Lymphatic/Immunilogical:   No cervical lymphadenopathy. Cardiovascular:   RRR. Symmetric bilateral radial and DP pulses.  No murmurs. Cap refill less than 2 seconds. Respiratory:   Normal respiratory effort without tachypnea/retractions.  Mild expiratory wheezing, normal expiratory phase. Gastrointestinal:   Soft and nontender. Non distended. There is no CVA tenderness.  No rebound, rigidity, or guarding.  Musculoskeletal:   Normal range of motion in all extremities. No joint effusions.  No lower extremity tenderness.  No edema. Neurologic:   Normal speech and language.  Motor grossly intact. No acute focal neurologic deficits are appreciated.  Skin:    Skin is warm, dry and intact. No rash noted.  No petechiae, purpura, or bullae.  ____________________________________________    LABS (pertinent positives/negatives) (all labs ordered are listed, but only abnormal results are displayed) Labs Reviewed  BASIC METABOLIC PANEL - Abnormal; Notable for the following components:      Result Value   Glucose, Bld 151 (*)    Calcium 8.7 (*)    All other components within normal limits  CBC - Abnormal; Notable for the following components:   WBC 13.0 (*)    Hemoglobin 11.7 (*)    HCT 35.6 (*)    All other components within normal limits  HEPATIC FUNCTION PANEL - Abnormal; Notable for the following components:   Total Protein 5.9 (*)    Albumin 3.4 (*)    All other components within normal limits  LIPASE, BLOOD  TROPONIN I (HIGH SENSITIVITY)   ____________________________________________   EKG  Interpreted by me Normal sinus rhythm rate of 90, normal axis.  Left bundle branch block.  No acute ischemic changes.  ____________________________________________     RADIOLOGY  Dg Chest 2 View  Result Date: 06/07/2019 CLINICAL DATA:  Chest pain, question musculoskeletal chest pain EXAM: CHEST - 2 VIEW COMPARISON:  Radiograph March 01, 2019 FINDINGS: No consolidation, features of edema, pneumothorax, or effusion. Pulmonary vascularity is normally distributed. The cardiomediastinal contours are unremarkable. No acute osseous or soft tissue abnormality. IMPRESSION: No acute cardiopulmonary abnormality. Electronically Signed   By: Kreg ShropshirePrice  DeHay M.D.   On: 06/07/2019 19:11    ____________________________________________   PROCEDURES Procedures  ____________________________________________    CLINICAL IMPRESSION / ASSESSMENT AND PLAN / ED COURSE  Medications ordered in the ED: Medications  alum & mag hydroxide-simeth (MAALOX/MYLANTA) 200-200-20 MG/5ML suspension 30 mL (30 mLs Oral Given 06/07/19 2023)  famotidine (PEPCID) tablet  40 mg (40 mg Oral Given 06/07/19 2023)  metoCLOPramide (REGLAN) tablet 10 mg (10 mg Oral Given 06/07/19 2023)    Pertinent labs & imaging results that were available during my care of the patient were reviewed by me and considered in my medical decision making (see chart for details).  Darlene Guzman was evaluated in Emergency Department on 06/07/2019 for the symptoms described in the history of present illness. She was evaluated in the context of the global COVID-19 pandemic, which necessitated consideration that the patient might be at risk for infection with the SARS-CoV-2 virus that causes COVID-19. Institutional protocols and algorithms that pertain to the evaluation of patients at risk for COVID-19 are in a state of rapid change based on information released by regulatory bodies including the CDC and federal and state organizations. These policies and algorithms were followed during the patient's care in the ED.   Patient presents with constellation of symptoms consistent with asthma exacerbation, dehydration, possible viral  syndrome.  Vital signs are normal.  Chest x-ray unremarkable, EKG nonischemic, labs unremarkable.  Given oral antacids, patient tolerating oral intake just fine.  Troponin sent by triage protocol, but symptoms are noncardiac and no further cardiac work-up is warranted.  Plan for discharge, supportive care, follow-up with primary care.      ____________________________________________   FINAL CLINICAL IMPRESSION(S) / ED DIAGNOSES    Final diagnoses:  Acute gastritis without hemorrhage, unspecified gastritis type  Dehydration     ED Discharge Orders         Ordered    famotidine (PEPCID) 20 MG tablet  2 times daily     06/07/19 2134    aluminum-magnesium hydroxide-simethicone (MAALOX) 200-200-20 MG/5ML SUSP  3 times daily before meals & bedtime     06/07/19 2134    predniSONE (DELTASONE) 20 MG tablet  Daily     06/07/19 2134          Portions of this note were generated with dragon dictation software. Dictation errors may occur despite best attempts at proofreading.   Carrie Mew, MD 06/07/19 2139

## 2019-06-07 NOTE — Discharge Instructions (Signed)
Your chest x-ray, EKG, and lab tests were all okay today.  Follow-up a primary care doctor for continued monitoring of your symptoms.

## 2019-08-14 ENCOUNTER — Other Ambulatory Visit: Payer: Self-pay

## 2019-08-14 ENCOUNTER — Emergency Department: Payer: Self-pay

## 2019-08-14 ENCOUNTER — Encounter: Payer: Self-pay | Admitting: Emergency Medicine

## 2019-08-14 ENCOUNTER — Emergency Department
Admission: EM | Admit: 2019-08-14 | Discharge: 2019-08-14 | Disposition: A | Discharge status: Home / Self Care | Payer: Self-pay | Attending: Emergency Medicine | Admitting: Emergency Medicine

## 2019-08-14 DIAGNOSIS — F17220 Nicotine dependence, chewing tobacco, uncomplicated: Secondary | ICD-10-CM | POA: Insufficient documentation

## 2019-08-14 DIAGNOSIS — J45909 Unspecified asthma, uncomplicated: Secondary | ICD-10-CM | POA: Insufficient documentation

## 2019-08-14 DIAGNOSIS — E119 Type 2 diabetes mellitus without complications: Secondary | ICD-10-CM | POA: Insufficient documentation

## 2019-08-14 DIAGNOSIS — R0789 Other chest pain: Secondary | ICD-10-CM | POA: Insufficient documentation

## 2019-08-14 DIAGNOSIS — I208 Other forms of angina pectoris: Secondary | ICD-10-CM | POA: Insufficient documentation

## 2019-08-14 DIAGNOSIS — R0602 Shortness of breath: Secondary | ICD-10-CM | POA: Insufficient documentation

## 2019-08-14 HISTORY — DX: Unspecified asthma, uncomplicated: J45.909

## 2019-08-14 LAB — CBC WITH DIFFERENTIAL/PLATELET
Abs Immature Granulocytes: 0.03 10*3/uL (ref 0.00–0.07)
Basophils Absolute: 0 10*3/uL (ref 0.0–0.1)
Basophils Relative: 1 %
Eosinophils Absolute: 0.2 10*3/uL (ref 0.0–0.5)
Eosinophils Relative: 2 %
HCT: 35.9 % — ABNORMAL LOW (ref 36.0–46.0)
Hemoglobin: 11.5 g/dL — ABNORMAL LOW (ref 12.0–15.0)
Immature Granulocytes: 0 %
Lymphocytes Relative: 28 %
Lymphs Abs: 2.4 10*3/uL (ref 0.7–4.0)
MCH: 27 pg (ref 26.0–34.0)
MCHC: 32 g/dL (ref 30.0–36.0)
MCV: 84.3 fL (ref 80.0–100.0)
Monocytes Absolute: 0.7 10*3/uL (ref 0.1–1.0)
Monocytes Relative: 8 %
Neutro Abs: 5.3 10*3/uL (ref 1.7–7.7)
Neutrophils Relative %: 61 %
Platelets: 359 10*3/uL (ref 150–400)
RBC: 4.26 MIL/uL (ref 3.87–5.11)
RDW: 14.1 % (ref 11.5–15.5)
WBC: 8.6 10*3/uL (ref 4.0–10.5)
nRBC: 0 % (ref 0.0–0.2)

## 2019-08-14 LAB — COMPREHENSIVE METABOLIC PANEL
ALT: 22 U/L (ref 0–44)
AST: 24 U/L (ref 15–41)
Albumin: 3.8 g/dL (ref 3.5–5.0)
Alkaline Phosphatase: 58 U/L (ref 38–126)
Anion gap: 9 (ref 5–15)
BUN: 17 mg/dL (ref 6–20)
CO2: 24 mmol/L (ref 22–32)
Calcium: 9.1 mg/dL (ref 8.9–10.3)
Chloride: 107 mmol/L (ref 98–111)
Creatinine, Ser: 0.81 mg/dL (ref 0.44–1.00)
GFR calc Af Amer: >60 mL/min (ref >60)
GFR calc non Af Amer: >60 mL/min (ref >60)
Glucose, Bld: 144 mg/dL — ABNORMAL HIGH (ref 70–99)
Potassium: 3.7 mmol/L (ref 3.5–5.1)
Sodium: 140 mmol/L (ref 135–145)
Total Bilirubin: 0.6 mg/dL (ref 0.3–1.2)
Total Protein: 7.1 g/dL (ref 6.5–8.1)

## 2019-08-14 LAB — TROPONIN I (HIGH SENSITIVITY)
Troponin I (High Sensitivity): 4 ng/L (ref <18)
Troponin I (High Sensitivity): 4 ng/L (ref <18)

## 2019-08-14 MED ORDER — ASPIRIN EC 325 MG PO TBEC: 325.0000 mg | DELAYED_RELEASE_TABLET | Freq: Every day | ORAL | 3 refills | Status: AC

## 2019-08-14 NOTE — ED Triage Notes (Signed)
Patient ambulatory to triage with steady gait, without difficulty or distress noted, mask in place; pt reports having upper CP, nonradiating accomp by [facility]; also st out of her albuterol inhaler

## 2019-08-14 NOTE — ED Provider Notes (Signed)
[facility] Emergency Department Provider Note       Time seen: ----------------------------------------- 7:36 AM on 08/14/2019 -----------------------------------------   I have reviewed the triage vital signs and the nursing notes.  HISTORY   Chief Complaint Chest Pain    HPI Darlene Guzman is a 43 y.o. female with a history of asthma, depression, diabetes, obesity who presents to the ED for chest pain with shortness of breath.  Patient states shortness of breath is there when she exerts herself, particular when she walks.  At this time when she exerts herself she also has chest tightness.  She does not have symptoms when she is at rest.  She denies fevers, chills or other complaints.  Past Medical History:  Diagnosis Date  . Asthma   . Depression   . Diabetes mellitus without complication ([place])   . Obesity     There are no active problems to display for this patient.   Past Surgical History:  Procedure Laterality Date  . BREAST SURGERY      Allergies Blueberry flavor, Lidocaine, Morphine and related, Raspberry, and Novocain [procaine]  Social History Social History   Tobacco Use  . Smoking status: Never Smoker  . Smokeless tobacco: Current User    Types: Chew  Substance Use Topics  . Alcohol use: No  . Drug use: No    Review of Systems Constitutional: Negative for fever. Cardiovascular: Positive for chest tightness Respiratory: Positive for shortness of breath Gastrointestinal: Negative for abdominal pain, vomiting and diarrhea. Musculoskeletal: Negative for back pain. Skin: Negative for rash. Neurological: Negative for headaches, focal weakness or numbness.  All systems negative/normal/unremarkable except as stated in the HPI  ____________________________________________   PHYSICAL EXAM:  VITAL SIGNS: ED Triage Vitals  Enc Vitals Group     BP 08/14/19 0543 (!) 148/70     Pulse Rate 08/14/19 0543 99     Resp 08/14/19  0543 18     Temp 08/14/19 0543 98.3 F (36.8 C)     Temp Source 08/14/19 0543 Oral     SpO2 08/14/19 0543 99 %     Weight 08/14/19 0545 230 lb (104.3 kg)     Height 08/14/19 0545 5' (1.524 m)     Head Circumference --      Peak Flow --      Pain Score 08/14/19 0545 6     Pain Loc --      Pain Edu? --      Excl. in [place]? --    Constitutional: Alert and oriented. Well appearing and in no distress. Eyes: Conjunctivae are normal. Normal extraocular movements. ENT      Head: Normocephalic and atraumatic.      Nose: No congestion/rhinnorhea.      Mouth/Throat: Mucous membranes are moist.      Neck: No stridor. Cardiovascular: Normal rate, regular rhythm. No murmurs, rubs, or gallops. Respiratory: Normal respiratory effort without tachypnea nor retractions. Breath sounds are clear and equal bilaterally. No wheezes/rales/rhonchi. Gastrointestinal: Soft and nontender. Normal bowel sounds Musculoskeletal: Nontender with normal range of motion in extremities. No lower extremity tenderness nor edema. Neurologic:  Normal speech and language. No gross focal neurologic deficits are appreciated.  Skin:  Skin is warm, dry and intact. No rash noted. Psychiatric: Mood and affect are normal. Speech and behavior are normal.  ____________________________________________  EKG: Interpreted by me.  Sinus rhythm with rate of 97 bpm, left axis deviation, left bundle branch block, long QT  ____________________________________________  ED COURSE:  As part of my medical decision making, I reviewed the following data within the electronic MEDICAL RECORD NUMBER History obtained from family if available, nursing notes, old chart and ekg, as well as notes from prior ED visits. Patient presented for chest pain and shortness of breath, we will assess with labs and imaging as indicated at this time.   Procedures  Darlene Guzman was evaluated in Emergency Department on 08/14/2019 for the symptoms described in the  history of present illness. She was evaluated in the context of the global COVID-19 pandemic, which necessitated consideration that the patient might be at risk for infection with the SARS-CoV-2 virus that causes COVID-19. Institutional protocols and algorithms that pertain to the evaluation of patients at risk for COVID-19 are in a state of rapid change based on information released by regulatory bodies including the CDC and federal and state organizations. These policies and algorithms were followed during the patient's care in the ED.  ____________________________________________   LABS (pertinent positives/negatives)  Labs Reviewed  CBC WITH DIFFERENTIAL/PLATELET - Abnormal; Notable for the following components:      Result Value   Hemoglobin 11.5 (*)    HCT 35.9 (*)    All other components within normal limits  COMPREHENSIVE METABOLIC PANEL - Abnormal; Notable for the following components:   Glucose, Bld 144 (*)    All other components within normal limits  TROPONIN I (HIGH SENSITIVITY)  TROPONIN I (HIGH SENSITIVITY)    RADIOLOGY  Chest x-ray IMPRESSION: Negative two view chest x-ray ____________________________________________   DIFFERENTIAL DIAGNOSIS   Stable angina, MI, asthma, GERD, gastritis  FINAL ASSESSMENT AND PLAN  Chest pain   Plan: The patient had presented for chest tightness and shortness of breath with exertion resembling stable angina. Patient's labs were reassuring. Patient's imaging did not reveal any acute process.  She has had a left bundle branch block since September but no other previous left bundle noted on EKG.  I do think she likely needs a stress test and perhaps eventually heart catheterization but insurance is a problem for her.  We have consulted social work.  At this point she is cleared for outpatient follow-up.   Ulice Dash, MD    Note: This note was generated in part or whole with voice recognition software. Voice recognition is  usually quite accurate but there are transcription errors that can and very often do occur. I apologize for any typographical errors that were not detected and corrected.     Emily Filbert, MD 08/14/19 [phone]

## 2019-08-14 NOTE — ED Notes (Signed)
Pt to bsc. Tolerated well.

## 2019-08-14 NOTE — ED Notes (Signed)
Diet order placed for patient at this time, pt given ice water per her request. Explained to patient awaiting a SW consult to assist patient with follow up. This RN stressed to patient the importance of cardiology follow up should patient be discharged. Pt states understanding. This RN also discussed resources with patient regarding medication management and booklet of free/reduced clinics that this RN placed with chart.

## 2019-08-14 NOTE — Social Work (Signed)
CSW spoke with patient regarding needing a follow-up appointment with a cardiologist. Patient shared that she just started a new job, and will not have insurance for 90 days, and EDP states that she will need the follow-up appointment soon. CSW provided [facility] with [facility], a book of free/low cost care resources in [employer], and number for medication management.  CSW also contacted Johna Sheriff at the [facility] so she will be aware of referral.  [facility] shared the [facility] was not far from her home, and will report there with the completed application. Patient thanked CSW for the assistance, and resources.  EDP notified. CSW signing off.    Fredric Mare, LCSW  [facility] ED

## 2019-08-14 NOTE — Social Work (Signed)
CSW aware of consult, and preparing necessary resources

## 2019-08-19 ENCOUNTER — Other Ambulatory Visit: Payer: Self-pay

## 2019-08-19 ENCOUNTER — Encounter: Payer: Self-pay | Admitting: Emergency Medicine

## 2019-08-19 ENCOUNTER — Emergency Department: Payer: Self-pay

## 2019-08-19 DIAGNOSIS — E119 Type 2 diabetes mellitus without complications: Secondary | ICD-10-CM | POA: Insufficient documentation

## 2019-08-19 DIAGNOSIS — F17220 Nicotine dependence, chewing tobacco, uncomplicated: Secondary | ICD-10-CM | POA: Insufficient documentation

## 2019-08-19 DIAGNOSIS — R079 Chest pain, unspecified: Secondary | ICD-10-CM | POA: Insufficient documentation

## 2019-08-19 DIAGNOSIS — Z79899 Other long term (current) drug therapy: Secondary | ICD-10-CM | POA: Insufficient documentation

## 2019-08-19 DIAGNOSIS — R0602 Shortness of breath: Secondary | ICD-10-CM | POA: Insufficient documentation

## 2019-08-19 DIAGNOSIS — J45909 Unspecified asthma, uncomplicated: Secondary | ICD-10-CM | POA: Insufficient documentation

## 2019-08-19 DIAGNOSIS — Z20828 Contact with and (suspected) exposure to other viral communicable diseases: Secondary | ICD-10-CM | POA: Insufficient documentation

## 2019-08-19 DIAGNOSIS — Z7982 Long term (current) use of aspirin: Secondary | ICD-10-CM | POA: Insufficient documentation

## 2019-08-19 NOTE — ED Triage Notes (Signed)
Patient ambulatory to triage with steady gait, without difficulty or distress noted, mask in place; pt reports left sided CP, nonradiating x hr with no accomp symptoms

## 2019-08-20 ENCOUNTER — Emergency Department
Admission: EM | Admit: 2019-08-20 | Discharge: 2019-08-20 | Disposition: A | Discharge status: Home / Self Care | Payer: Self-pay | Attending: Emergency Medicine | Admitting: Emergency Medicine

## 2019-08-20 DIAGNOSIS — R079 Chest pain, unspecified: Secondary | ICD-10-CM

## 2019-08-20 LAB — CBC WITH DIFFERENTIAL/PLATELET
Abs Immature Granulocytes: 0.03 10*3/uL (ref 0.00–0.07)
Basophils Absolute: 0 10*3/uL (ref 0.0–0.1)
Basophils Relative: 1 %
Eosinophils Absolute: 0.1 10*3/uL (ref 0.0–0.5)
Eosinophils Relative: 1 %
HCT: 35.6 % — ABNORMAL LOW (ref 36.0–46.0)
Hemoglobin: 11.1 g/dL — ABNORMAL LOW (ref 12.0–15.0)
Immature Granulocytes: 0 %
Lymphocytes Relative: 23 %
Lymphs Abs: 1.7 10*3/uL (ref 0.7–4.0)
MCH: 26.6 pg (ref 26.0–34.0)
MCHC: 31.2 g/dL (ref 30.0–36.0)
MCV: 85.4 fL (ref 80.0–100.0)
Monocytes Absolute: 0.5 10*3/uL (ref 0.1–1.0)
Monocytes Relative: 7 %
Neutro Abs: 5 10*3/uL (ref 1.7–7.7)
Neutrophils Relative %: 68 %
Platelets: 151 10*3/uL (ref 150–400)
RBC: 4.17 MIL/uL (ref 3.87–5.11)
RDW: 13.7 % (ref 11.5–15.5)
WBC: 7.3 10*3/uL (ref 4.0–10.5)
nRBC: 0 % (ref 0.0–0.2)

## 2019-08-20 LAB — COMPREHENSIVE METABOLIC PANEL
ALT: 20 U/L (ref 0–44)
AST: 24 U/L (ref 15–41)
Albumin: 3.8 g/dL (ref 3.5–5.0)
Alkaline Phosphatase: 60 U/L (ref 38–126)
Anion gap: 8 (ref 5–15)
BUN: 13 mg/dL (ref 6–20)
CO2: 24 mmol/L (ref 22–32)
Calcium: 8.8 mg/dL — ABNORMAL LOW (ref 8.9–10.3)
Chloride: 104 mmol/L (ref 98–111)
Creatinine, Ser: 1.08 mg/dL — ABNORMAL HIGH (ref 0.44–1.00)
GFR calc Af Amer: >60 mL/min (ref >60)
GFR calc non Af Amer: >60 mL/min (ref >60)
Glucose, Bld: 122 mg/dL — ABNORMAL HIGH (ref 70–99)
Potassium: 3.7 mmol/L (ref 3.5–5.1)
Sodium: 136 mmol/L (ref 135–145)
Total Bilirubin: 0.5 mg/dL (ref 0.3–1.2)
Total Protein: 7.1 g/dL (ref 6.5–8.1)

## 2019-08-20 LAB — TROPONIN I (HIGH SENSITIVITY)
Troponin I (High Sensitivity): 4 ng/L (ref <18)
Troponin I (High Sensitivity): 4 ng/L (ref <18)

## 2019-08-20 LAB — SARS CORONAVIRUS 2 (TAT 6-24 HRS): SARS Coronavirus 2: NEGATIVE

## 2019-08-20 NOTE — Discharge Instructions (Signed)
Please keep your appointment on Thursday to see the cardiologist.  Return to the ER for worsening symptoms, persistent vomiting, difficulty breathing or other concerns.

## 2019-08-20 NOTE — ED Provider Notes (Signed)
[facility] Emergency Department Provider Note   ____________________________________________   First MD Initiated Contact with Patient 08/20/19 [phone]     (approximate)  I have reviewed the triage vital signs and the nursing notes.   HISTORY  Chief Complaint Chest Pain    HPI Darlene Guzman is a 43 y.o. female who presents to the ED from home with a chief complaint of chest pain.  Patient was seen for similar symptoms last week. Has an appointment with cardiology in 2 days.  Reports left-sided chest tightness only associated with walking.  Reports walking short distances which makes her short of breath.  Denies associated diaphoresis, palpitations, nausea/vomiting or dizziness.  Denies fever, cough, abdominal pain, diarrhea.  Denies recent travel or trauma.  Just started a new job at [employer] and thinks the rigorous schedule and work is contributing to her symptoms.      Past Medical History:  Diagnosis Date  . Asthma   . Depression   . Diabetes mellitus without complication ([place])   . Obesity     There are no active problems to display for this patient.   Past Surgical History:  Procedure Laterality Date  . BREAST SURGERY      Prior to Admission medications   Medication Sig Start Date End Date Taking? Authorizing Provider  albuterol (VENTOLIN HFA) 108 (90 Base) MCG/ACT inhaler Inhale 2 puffs into the lungs every 4 (four) hours as needed for wheezing or shortness of breath. 04/02/19   Cuthriell, Charline Bills, PA-C  aluminum-magnesium hydroxide-simethicone (MAALOX) 979-892-11 MG/5ML SUSP Take 30 mLs by mouth 4 (four) times daily -  before meals and at bedtime. 06/07/19   Carrie Mew, MD  amoxicillin (AMOXIL) 500 MG capsule Take 1 capsule (500 mg total) by mouth 3 (three) times daily. 09/15/18   Fransico Meadow, PA-C  aspirin EC 325 MG tablet Take 1 tablet (325 mg total) by mouth daily. 08/14/19 08/13/20  Earleen Newport, MD  Budesonide (PULMICORT  FLEXHALER) 90 MCG/ACT inhaler Inhale 1 puff into the lungs 2 (two) times daily. 04/02/19   Cuthriell, Charline Bills, PA-C  famotidine (PEPCID) 20 MG tablet Take 1 tablet (20 mg total) by mouth 2 (two) times daily. 06/07/19   Carrie Mew, MD  montelukast (SINGULAIR) 10 MG tablet Take 1 tablet (10 mg total) by mouth daily. 04/02/19 04/01/20  Cuthriell, Charline Bills, PA-C  predniSONE (DELTASONE) 20 MG tablet Take 2 tablets (40 mg total) by mouth daily. 06/07/19   Carrie Mew, MD    Allergies Blueberry flavor, Lidocaine, Morphine and related, Raspberry, and Novocain [procaine]  No family history on file.  Social History Social History   Tobacco Use  . Smoking status: Never Smoker  . Smokeless tobacco: Current User    Types: Chew  Substance Use Topics  . Alcohol use: No  . Drug use: No    Review of Systems  Constitutional: No fever/chills Eyes: No visual changes. ENT: No sore throat. Cardiovascular: Positive for chest pain. Respiratory: Positive for shortness of breath on exertion. Gastrointestinal: No abdominal pain.  No nausea, no vomiting.  No diarrhea.  No constipation. Genitourinary: Negative for dysuria. Musculoskeletal: Negative for back pain. Skin: Negative for rash. Neurological: Negative for headaches, focal weakness or numbness.   ____________________________________________   PHYSICAL EXAM:  VITAL SIGNS: ED Triage Vitals  Enc Vitals Group     BP 08/19/19 2345 (!) 159/84     Pulse Rate 08/19/19 2345 88     Resp 08/19/19 2345 (!) 22  Temp 08/19/19 2345 98.1 F (36.7 C)     Temp Source 08/19/19 2345 Oral     SpO2 08/19/19 2345 100 %     Weight 08/19/19 2324 230 lb (104.3 kg)     Height 08/19/19 2324 5' (1.524 m)     Head Circumference --      Peak Flow --      Pain Score 08/19/19 2323 5     Pain Loc --      Pain Edu? --      Excl. in GC? --     Constitutional: Alert and oriented. Well appearing and in no acute distress. Eyes: Conjunctivae are  normal. PERRL. EOMI. Head: Atraumatic. Nose: No congestion/rhinnorhea. Mouth/Throat: Mucous membranes are moist.  Oropharynx non-erythematous. Neck: No stridor.   Cardiovascular: Normal rate, regular rhythm. Grossly normal heart sounds.  Good peripheral circulation. Respiratory: Normal respiratory effort.  No retractions. Lungs CTAB. Gastrointestinal: Soft and nontender. No distention. No abdominal bruits. No CVA tenderness. Musculoskeletal: No lower extremity tenderness nor edema.  No joint effusions. Neurologic:  Normal speech and language. No gross focal neurologic deficits are appreciated. No gait instability. Skin:  Skin is warm, dry and intact. No rash noted. Psychiatric: Mood and affect are normal. Speech and behavior are normal.  ____________________________________________   LABS (all labs ordered are listed, but only abnormal results are displayed)  Labs Reviewed  CBC WITH DIFFERENTIAL/PLATELET - Abnormal; Notable for the following components:      Result Value   Hemoglobin 11.1 (*)    HCT 35.6 (*)    All other components within normal limits  COMPREHENSIVE METABOLIC PANEL - Abnormal; Notable for the following components:   Glucose, Bld 122 (*)    Creatinine, Ser 1.08 (*)    Calcium 8.8 (*)    All other components within normal limits  SARS CORONAVIRUS 2 (TAT 6-24 HRS)  TROPONIN I (HIGH SENSITIVITY)  TROPONIN I (HIGH SENSITIVITY)   ____________________________________________  EKG  ED ECG REPORT I, Toniya Rozar J, the attending physician, personally viewed and interpreted this ECG.   Date: 08/20/2019  EKG Time: 2326  Rate: 91  Rhythm: normal EKG, normal sinus rhythm  Axis: Normal  Intervals:nonspecific intraventricular conduction delay  ST&T Change: Nonspecific Unchanged from 08/14/2019 ____________________________________________  RADIOLOGY  ED MD interpretation: No acute cardiopulmonary process  Official radiology report(s): Dg Chest 2 View  Result  Date: 08/19/2019 CLINICAL DATA:  Chest pain EXAM: CHEST - 2 VIEW COMPARISON:  08/14/2019 FINDINGS: Cardiac shadows within normal limits. Lungs are clear bilaterally. No acute bony abnormality is noted. IMPRESSION: No active cardiopulmonary disease. Electronically Signed   By: Alcide CleverMark  Lukens M.D.   On: 08/19/2019 23:54    ____________________________________________   PROCEDURES  Procedure(s) performed (including Critical Care):  Procedures   ____________________________________________   INITIAL IMPRESSION / ASSESSMENT AND PLAN / ED COURSE  As part of my medical decision making, I reviewed the following data within the electronic MEDICAL RECORD NUMBER Nursing notes reviewed and incorporated, Labs reviewed, EKG interpreted, Old EKG reviewed, Old chart reviewed, Radiograph reviewed and Notes from prior ED visits     Arie SabinaLaura Donati was evaluated in Emergency Department on 08/20/2019 for the symptoms described in the history of present illness. She was evaluated in the context of the global COVID-19 pandemic, which necessitated consideration that the patient might be at risk for infection with the SARS-CoV-2 virus that causes COVID-19. Institutional protocols and algorithms that pertain to the evaluation of patients at risk for COVID-19 are in a state  of rapid change based on information released by regulatory bodies including the CDC and federal and state organizations. These policies and algorithms were followed during the patient's care in the ED.    43 year old female who presents to the ED for dyspnea on exertion and chest pain. Differential diagnosis includes, but is not limited to, ACS, aortic dissection, pulmonary embolism, cardiac tamponade, pneumothorax, pneumonia, pericarditis, myocarditis, GI-related causes including esophagitis/gastritis, and musculoskeletal chest wall pain.    Patient has 2 sets of negative troponins.  Has a cardiology appointment in 2 days.  Wishes to continue working  as she likes her new job.  Will obtain send out COVID-19 swab.  Strict return precautions given.  Patient verbalizes understanding and agrees with plan of care.      ____________________________________________   FINAL CLINICAL IMPRESSION(S) / ED DIAGNOSES  Final diagnoses:  Nonspecific chest pain     ED Discharge Orders    None       Note:  This document was prepared using Dragon voice recognition software and may include unintentional dictation errors.   Irean Hong, MD 08/20/19 [phone]

## 2019-08-21 ENCOUNTER — Other Ambulatory Visit: Payer: Self-pay

## 2019-08-21 DIAGNOSIS — R0789 Other chest pain: Secondary | ICD-10-CM | POA: Insufficient documentation

## 2019-08-21 DIAGNOSIS — J45909 Unspecified asthma, uncomplicated: Secondary | ICD-10-CM | POA: Insufficient documentation

## 2019-08-21 DIAGNOSIS — Z79899 Other long term (current) drug therapy: Secondary | ICD-10-CM | POA: Insufficient documentation

## 2019-08-21 DIAGNOSIS — E119 Type 2 diabetes mellitus without complications: Secondary | ICD-10-CM | POA: Insufficient documentation

## 2019-08-21 LAB — COMPREHENSIVE METABOLIC PANEL
ALT: 19 U/L (ref 0–44)
AST: 19 U/L (ref 15–41)
Albumin: 3.7 g/dL (ref 3.5–5.0)
Alkaline Phosphatase: 54 U/L (ref 38–126)
Anion gap: 11 (ref 5–15)
BUN: 12 mg/dL (ref 6–20)
CO2: 23 mmol/L (ref 22–32)
Calcium: 8.9 mg/dL (ref 8.9–10.3)
Chloride: 104 mmol/L (ref 98–111)
Creatinine, Ser: 0.68 mg/dL (ref 0.44–1.00)
GFR calc Af Amer: >60 mL/min (ref >60)
GFR calc non Af Amer: >60 mL/min (ref >60)
Glucose, Bld: 122 mg/dL — ABNORMAL HIGH (ref 70–99)
Potassium: 3.5 mmol/L (ref 3.5–5.1)
Sodium: 138 mmol/L (ref 135–145)
Total Bilirubin: 0.8 mg/dL (ref 0.3–1.2)
Total Protein: 6.8 g/dL (ref 6.5–8.1)

## 2019-08-21 LAB — CBC
HCT: 33.9 % — ABNORMAL LOW (ref 36.0–46.0)
Hemoglobin: 10.9 g/dL — ABNORMAL LOW (ref 12.0–15.0)
MCH: 26.8 pg (ref 26.0–34.0)
MCHC: 32.2 g/dL (ref 30.0–36.0)
MCV: 83.5 fL (ref 80.0–100.0)
Platelets: 332 10*3/uL (ref 150–400)
RBC: 4.06 MIL/uL (ref 3.87–5.11)
RDW: 13.6 % (ref 11.5–15.5)
WBC: 8.1 10*3/uL (ref 4.0–10.5)
nRBC: 0 % (ref 0.0–0.2)

## 2019-08-21 LAB — TROPONIN I (HIGH SENSITIVITY): Troponin I (High Sensitivity): 6 ng/L (ref <18)

## 2019-08-21 NOTE — ED Triage Notes (Signed)
Pt in with co chest pain for over a week when she stands or moves. Was here yesterday for the same and discharged home to follow up with her cardiologist.

## 2019-08-22 ENCOUNTER — Emergency Department
Admission: EM | Admit: 2019-08-22 | Discharge: 2019-08-22 | Disposition: A | Discharge status: Home / Self Care | Payer: Self-pay | Attending: Emergency Medicine | Admitting: Emergency Medicine

## 2019-08-22 DIAGNOSIS — R079 Chest pain, unspecified: Secondary | ICD-10-CM

## 2019-08-22 LAB — TROPONIN I (HIGH SENSITIVITY): Troponin I (High Sensitivity): 4 ng/L (ref <18)

## 2019-08-22 MED ORDER — ASPIRIN 81 MG PO CHEW
81.0000 mg | CHEWABLE_TABLET | Freq: Once | ORAL | Status: AC
  Administered 2019-08-22: 81 mg via ORAL
  Filled 2019-08-22: qty 1

## 2019-08-22 NOTE — ED Provider Notes (Signed)
[facility] Emergency Department Provider Note  ____________________________________________   First MD Initiated Contact with Patient 08/22/19 0301     (approximate)  I have reviewed the triage vital signs and the nursing notes.   HISTORY  Chief Complaint Chest Pain    HPI Darlene Guzman is a 43 y.o. female with prior history of diabetes now off medication secondary to losing weight who now presents with chest pain.  Patient had 2 similar visits for chest pain.  She states this is the exact same pain that she was presented with the past 2 times.  She states that she did not want to come to the ER but her friend wanted her to be evaluated.  She states that the chest pain has not changed at all character.  For the past 2 to 3 weeks she has had exertional shortness of breath and chest discomfort that will stop as soon as she is at rest.  At rest she has no pain at all.  Is not changed in character.  The pain is moderate intermittent.  She states that she has a follow-up appointment today at 3 PM.  She states if she feels comfortable going to his appointment but that her friend wanted her to make sure that she was okay.  Denies any other risk factors for PE          Past Medical History:  Diagnosis Date  . Asthma   . Depression   . Diabetes mellitus without complication ([place])   . Obesity     There are no problems to display for this patient.   Past Surgical History:  Procedure Laterality Date  . BREAST SURGERY      Prior to Admission medications   Medication Sig Start Date End Date Taking? Authorizing Provider  albuterol (VENTOLIN HFA) 108 (90 Base) MCG/ACT inhaler Inhale 2 puffs into the lungs every 4 (four) hours as needed for wheezing or shortness of breath. 04/02/19   Cuthriell, Charline Bills, PA-C  aluminum-magnesium hydroxide-simethicone (MAALOX) [phone] MG/5ML SUSP Take 30 mLs by mouth 4 (four) times daily -  before meals and at bedtime.  06/07/19   Carrie Mew, MD  amoxicillin (AMOXIL) 500 MG capsule Take 1 capsule (500 mg total) by mouth 3 (three) times daily. 09/15/18   Fransico Meadow, PA-C  aspirin EC 325 MG tablet Take 1 tablet (325 mg total) by mouth daily. 08/14/19 08/13/20  Earleen Newport, MD  Budesonide (PULMICORT FLEXHALER) 90 MCG/ACT inhaler Inhale 1 puff into the lungs 2 (two) times daily. 04/02/19   Cuthriell, Charline Bills, PA-C  famotidine (PEPCID) 20 MG tablet Take 1 tablet (20 mg total) by mouth 2 (two) times daily. 06/07/19   Carrie Mew, MD  montelukast (SINGULAIR) 10 MG tablet Take 1 tablet (10 mg total) by mouth daily. 04/02/19 04/01/20  Cuthriell, Charline Bills, PA-C  predniSONE (DELTASONE) 20 MG tablet Take 2 tablets (40 mg total) by mouth daily. 06/07/19   Carrie Mew, MD    Allergies Blueberry flavor, Lidocaine, Morphine and related, Raspberry, and Novocain [procaine]  No family history on file.  Social History Social History   Tobacco Use  . Smoking status: Never Smoker  . Smokeless tobacco: Current User    Types: Chew  Substance Use Topics  . Alcohol use: No  . Drug use: No      Review of Systems Constitutional: No fever/chills Eyes: No visual changes. ENT: No sore throat. Cardiovascular: Positive chest pain Respiratory: Positive shortness of breath  with exertion. Gastrointestinal: No abdominal pain.  No nausea, no vomiting.  No diarrhea.  No constipation. Genitourinary: Negative for dysuria. Musculoskeletal: Negative for back pain. Skin: Negative for rash. Neurological: Negative for headaches, focal weakness or numbness. All other ROS negative ____________________________________________   PHYSICAL EXAM:  VITAL SIGNS: ED Triage Vitals  Enc Vitals Group     BP 08/21/19 2252 (!) 146/76     Pulse Rate 08/21/19 2252 78     Resp 08/21/19 2252 20     Temp 08/21/19 2252 98.7 F (37.1 C)     Temp Source 08/21/19 2252 Oral     SpO2 08/21/19 2252 96 %     Weight  08/21/19 2253 229 lb 15 oz (104.3 kg)     Height --      Head Circumference --      Peak Flow --      Pain Score 08/21/19 2253 0     Pain Loc --      Pain Edu? --      Excl. in GC? --     Constitutional: Alert and oriented. Well appearing and in no acute distress. Eyes: Conjunctivae are normal. EOMI. Head: Atraumatic. Nose: No congestion/rhinnorhea. Mouth/Throat: Mucous membranes are moist.   Neck: No stridor. Trachea Midline. FROM Cardiovascular: Normal rate, regular rhythm. Grossly normal heart sounds.  Good peripheral circulation. Respiratory: Normal respiratory effort.  No retractions. Lungs CTAB. Gastrointestinal: Soft and nontender. No distention. No abdominal bruits.  Musculoskeletal: No lower extremity tenderness nor edema.  No joint effusions. Neurologic:  Normal speech and language. No gross focal neurologic deficits are appreciated.  Skin:  Skin is warm, dry and intact. No rash noted. Psychiatric: Mood and affect are normal. Speech and behavior are normal. GU: Deferred   ____________________________________________   LABS (all labs ordered are listed, but only abnormal results are displayed)  Labs Reviewed  CBC - Abnormal; Notable for the following components:      Result Value   Hemoglobin 10.9 (*)    HCT 33.9 (*)    All other components within normal limits  COMPREHENSIVE METABOLIC PANEL - Abnormal; Notable for the following components:   Glucose, Bld 122 (*)    All other components within normal limits  TROPONIN I (HIGH SENSITIVITY)  TROPONIN I (HIGH SENSITIVITY)   ____________________________________________   ED ECG REPORT I, Concha Se, the attending physician, personally viewed and interpreted this ECG.  EKG is normal sinus rate of 76 intraventricular conduction block some inverted T waves similar to prior, normal intervals otherwise. ____________________________________________  RADIOLOGY Vela Prose, personally viewed and evaluated these  images (plain radiographs) as part of my medical decision making, as well as reviewing the written report by the radiologist.  ED MD interpretation: Recent chest x-ray was negative for did not repeated again today per patient request  ____________________________________________   PROCEDURES  Procedure(s) performed (including Critical Care):  Procedures   ____________________________________________   INITIAL IMPRESSION / ASSESSMENT AND PLAN / ED COURSE   Darlene Guzman was evaluated in Emergency Department on 08/22/2019 for the symptoms described in the history of present illness. She was evaluated in the context of the global COVID-19 pandemic, which necessitated consideration that the patient might be at risk for infection with the SARS-CoV-2 virus that causes COVID-19. Institutional protocols and algorithms that pertain to the evaluation of patients at risk for COVID-19 are in a state of rapid change based on information released by regulatory bodies including the CDC and federal and state  organizations. These policies and algorithms were followed during the patient's care in the ED.    Most Likely DDx:  -Patient symptoms sound like stable angina given there only occurring with exertion.  They have been the same symptoms for the past 2 to 3 weeks.  She already has a follow-up appointment today in 12 hours.  Will get cardiac markers to ensure there is no evidence of ACS.    DDx that was also considered d/t potential to cause harm, but was found less likely based on history and physical (as detailed above): -PNA (no fevers, cough but CXR to evaluate) -PNX (reassured with equal b/l breath sounds, CXR to evaluate) -Symptomatic anemia (will get H&H) -Pulmonary embolism as no sob at rest, not pleuritic in nature, no hypoxia-PERC negative. -Aortic Dissection as no tearing pain and no radiation to the mid back, pulses equal -Pericarditis no rub on exam, EKG changes or hx to suggest dx  -Tamponade (no notable SOB, tachycardic, hypotensive) -Esophageal rupture (no h/o diffuse vomitting/no crepitus)   Labs are reassuring around her baseline.  Troponins are negative x2.  Had a lengthy discussion with patient and she would prefer to follow-up outpatient with cardiology as scheduled but just want to make sure that there was no signs of heart attack today.  We had a lengthy discussion about what stable angina means and how it is important that she gets this follow-up because her symptoms are very concerning.  She denies any pain at this time or since coming to the ER.  It was only when she was exerting herself before going to work.  I discussed the provisional nature of ED diagnosis, the treatment so far, the ongoing plan of care, follow up appointments and return precautions with the patient and any family or support people present. They expressed understanding and agreed with the plan, discharged home.  ________________________________________   FINAL CLINICAL IMPRESSION(S) / ED DIAGNOSES   Final diagnoses:  Chest pain, unspecified type     MEDICATIONS GIVEN DURING THIS VISIT:  Medications  aspirin chewable tablet 81 mg (has no administration in time range)     ED Discharge Orders    None       Note:  This document was prepared using Dragon voice recognition software and may include unintentional dictation errors.   Concha SeFunke, Leven Hoel E, MD 08/22/19 [phone]

## 2019-08-22 NOTE — Discharge Instructions (Signed)
Follow-up with your cardiology appointment today.  Return to the ER if you develop pain at rest or any other concerns.

## 2019-09-19 ENCOUNTER — Other Ambulatory Visit: Payer: Self-pay

## 2019-09-19 ENCOUNTER — Encounter: Payer: Self-pay | Admitting: Emergency Medicine

## 2019-09-19 ENCOUNTER — Emergency Department
Admission: EM | Admit: 2019-09-19 | Discharge: 2019-09-19 | Disposition: A | Discharge status: Home / Self Care | Payer: Self-pay | Attending: Student in an Organized Health Care Education/Training Program | Admitting: Student in an Organized Health Care Education/Training Program

## 2019-09-19 ENCOUNTER — Telehealth: Payer: Self-pay | Admitting: General Practice

## 2019-09-19 ENCOUNTER — Emergency Department: Payer: Self-pay

## 2019-09-19 DIAGNOSIS — E119 Type 2 diabetes mellitus without complications: Secondary | ICD-10-CM | POA: Insufficient documentation

## 2019-09-19 DIAGNOSIS — Z7982 Long term (current) use of aspirin: Secondary | ICD-10-CM | POA: Insufficient documentation

## 2019-09-19 DIAGNOSIS — R0602 Shortness of breath: Secondary | ICD-10-CM | POA: Insufficient documentation

## 2019-09-19 DIAGNOSIS — J45909 Unspecified asthma, uncomplicated: Secondary | ICD-10-CM | POA: Insufficient documentation

## 2019-09-19 DIAGNOSIS — Z79899 Other long term (current) drug therapy: Secondary | ICD-10-CM | POA: Insufficient documentation

## 2019-09-19 LAB — COMPREHENSIVE METABOLIC PANEL
ALT: 17 U/L (ref 0–44)
AST: 20 U/L (ref 15–41)
Albumin: 3.8 g/dL (ref 3.5–5.0)
Alkaline Phosphatase: 61 U/L (ref 38–126)
Anion gap: 7 (ref 5–15)
BUN: 14 mg/dL (ref 6–20)
CO2: 26 mmol/L (ref 22–32)
Calcium: 9 mg/dL (ref 8.9–10.3)
Chloride: 104 mmol/L (ref 98–111)
Creatinine, Ser: 0.79 mg/dL (ref 0.44–1.00)
GFR calc Af Amer: >60 mL/min (ref >60)
GFR calc non Af Amer: >60 mL/min (ref >60)
Glucose, Bld: 171 mg/dL — ABNORMAL HIGH (ref 70–99)
Potassium: 3.9 mmol/L (ref 3.5–5.1)
Sodium: 137 mmol/L (ref 135–145)
Total Bilirubin: 0.6 mg/dL (ref 0.3–1.2)
Total Protein: 6.9 g/dL (ref 6.5–8.1)

## 2019-09-19 LAB — CBC WITH DIFFERENTIAL/PLATELET
Abs Immature Granulocytes: 0.04 10*3/uL (ref 0.00–0.07)
Basophils Absolute: 0 10*3/uL (ref 0.0–0.1)
Basophils Relative: 0 %
Eosinophils Absolute: 0.1 10*3/uL (ref 0.0–0.5)
Eosinophils Relative: 1 %
HCT: 34 % — ABNORMAL LOW (ref 36.0–46.0)
Hemoglobin: 10.7 g/dL — ABNORMAL LOW (ref 12.0–15.0)
Immature Granulocytes: 0 %
Lymphocytes Relative: 19 %
Lymphs Abs: 1.7 10*3/uL (ref 0.7–4.0)
MCH: 25.8 pg — ABNORMAL LOW (ref 26.0–34.0)
MCHC: 31.5 g/dL (ref 30.0–36.0)
MCV: 82.1 fL (ref 80.0–100.0)
Monocytes Absolute: 0.6 10*3/uL (ref 0.1–1.0)
Monocytes Relative: 7 %
Neutro Abs: 6.5 10*3/uL (ref 1.7–7.7)
Neutrophils Relative %: 73 %
Platelets: 304 10*3/uL (ref 150–400)
RBC: 4.14 MIL/uL (ref 3.87–5.11)
RDW: 13.9 % (ref 11.5–15.5)
WBC: 9 10*3/uL (ref 4.0–10.5)
nRBC: 0 % (ref 0.0–0.2)

## 2019-09-19 LAB — TROPONIN I (HIGH SENSITIVITY)
Troponin I (High Sensitivity): 2 ng/L (ref <18)
Troponin I (High Sensitivity): 2 ng/L (ref <18)

## 2019-09-19 MED ORDER — ALBUTEROL SULFATE HFA 108 (90 BASE) MCG/ACT IN AERS: 2.0000 | INHALATION_SPRAY | Freq: Four times a day (QID) | RESPIRATORY_TRACT | 0 refills | Status: DC | PRN

## 2019-09-19 MED ORDER — ALBUTEROL SULFATE HFA 108 (90 BASE) MCG/ACT IN AERS: 2.0000 | INHALATION_SPRAY | RESPIRATORY_TRACT | 3 refills | Status: DC | PRN

## 2019-09-19 MED ORDER — ALBUTEROL SULFATE (2.5 MG/3ML) 0.083% IN NEBU: 5.0000 mg | INHALATION_SOLUTION | Freq: Once | RESPIRATORY_TRACT | Status: DC

## 2019-09-19 NOTE — ED Triage Notes (Signed)
C/O SOB with walking.  Recently admitted to [facility] for same, multiple tests done -- started on metformin and lisinopril and norvasc.  Patient states she has been feeling great, but today DOE returned.  Patient is AAOx3.  Skin warm and dry. NAD.  No SOB/ DOE.

## 2019-09-19 NOTE — Telephone Encounter (Signed)
Called patient twice on 1/7, both calls went straight to vm and the VM box is full

## 2019-09-19 NOTE — ED Provider Notes (Signed)
[facility] Emergency Department Provider Note    First MD Initiated Contact with Patient 09/19/19 1802     (approximate)  I have reviewed the triage vital signs and the nursing notes.   HISTORY  Chief Complaint Shortness of Breath    HPI Darlene Guzman is a 44 y.o. female presents the ER for evaluation of shortness of breath that occurred while she was walking to work today.  States she has a long history of the same.  Is been extensively evaluated at [facility].  She denies any chest pain or pressure.  Symptoms got better after rest.  States she does have some improvement with albuterol but has not been able to afford the inhaler.  Does not feel short of breath right now.  States she was only here because her work sent her for evaluation.  She denies any nausea or vomiting.  No fevers.    Past Medical History:  Diagnosis Date  . Asthma   . Depression   . Diabetes mellitus without complication (HCC)   . Obesity    No family history on file. Past Surgical History:  Procedure Laterality Date  . BREAST SURGERY     There are no problems to display for this patient.     Prior to Admission medications   Medication Sig Start Date End Date Taking? Authorizing Provider  albuterol (VENTOLIN HFA) 108 (90 Base) MCG/ACT inhaler Inhale 2 puffs into the lungs every 6 (six) hours as needed for wheezing or shortness of breath. 09/19/19   Willy Eddy, MD  albuterol (VENTOLIN HFA) 108 (90 Base) MCG/ACT inhaler Inhale 2 puffs into the lungs every 4 (four) hours as needed for wheezing or shortness of breath. 09/19/19   Willy Eddy, MD  aluminum-magnesium hydroxide-simethicone (MAALOX) 200-200-20 MG/5ML SUSP Take 30 mLs by mouth 4 (four) times daily -  before meals and at bedtime. 06/07/19   Sharman Cheek, MD  amoxicillin (AMOXIL) 500 MG capsule Take 1 capsule (500 mg total) by mouth 3 (three) times daily. 09/15/18   Elson Areas, PA-C  aspirin EC 325 MG  tablet Take 1 tablet (325 mg total) by mouth daily. 08/14/19 08/13/20  Emily Filbert, MD  Budesonide (PULMICORT FLEXHALER) 90 MCG/ACT inhaler Inhale 1 puff into the lungs 2 (two) times daily. 04/02/19   Cuthriell, Delorise Royals, PA-C  famotidine (PEPCID) 20 MG tablet Take 1 tablet (20 mg total) by mouth 2 (two) times daily. 06/07/19   Sharman Cheek, MD  montelukast (SINGULAIR) 10 MG tablet Take 1 tablet (10 mg total) by mouth daily. 04/02/19 04/01/20  Cuthriell, Delorise Royals, PA-C  predniSONE (DELTASONE) 20 MG tablet Take 2 tablets (40 mg total) by mouth daily. 06/07/19   Sharman Cheek, MD    Allergies Blueberry flavor, Lidocaine, Morphine and related, Raspberry, and Novocain [procaine]    Social History Social History   Tobacco Use  . Smoking status: Never Smoker  . Smokeless tobacco: Current User    Types: Chew  Substance Use Topics  . Alcohol use: No  . Drug use: No    Review of Systems Patient denies headaches, rhinorrhea, blurry vision, numbness, shortness of breath, chest pain, edema, cough, abdominal pain, nausea, vomiting, diarrhea, dysuria, fevers, rashes or hallucinations unless otherwise stated above in HPI. ____________________________________________   PHYSICAL EXAM:  VITAL SIGNS: Vitals:   09/19/19 1611 09/19/19 1805  BP: 132/71   Pulse: 86   Resp: 16   Temp: 98.8 F (37.1 C)   SpO2: 98% 100%  Constitutional: Alert and oriented.  Eyes: Conjunctivae are normal.  Head: Atraumatic. Nose: No congestion/rhinnorhea. Mouth/Throat: Mucous membranes are moist.   Neck: No stridor. Painless ROM.  Cardiovascular: Normal rate, regular rhythm. Grossly normal heart sounds.  Good peripheral circulation. Respiratory: Normal respiratory effort.  No retractions. Lungs CTAB. Gastrointestinal: Soft and nontender. No distention. No abdominal bruits. No CVA tenderness. Genitourinary:  Musculoskeletal: No lower extremity tenderness nor edema.  No joint  effusions. Neurologic:  Normal speech and language. No gross focal neurologic deficits are appreciated. No facial droop Skin:  Skin is warm, dry and intact. No rash noted. Psychiatric: Mood and affect are normal. Speech and behavior are normal.  ____________________________________________   LABS (all labs ordered are listed, but only abnormal results are displayed)  Results for orders placed or performed during the hospital encounter of 09/19/19 (from the past 24 hour(s))  CBC with Differential     Status: Abnormal   Collection Time: 09/19/19  4:13 PM  Result Value Ref Range   WBC 9.0 4.0 - 10.5 K/uL   RBC 4.14 3.87 - 5.11 MIL/uL   Hemoglobin 10.7 (L) 12.0 - 15.0 g/dL   HCT 34.0 (L) 36.0 - 46.0 %   MCV 82.1 80.0 - 100.0 fL   MCH 25.8 (L) 26.0 - 34.0 pg   MCHC 31.5 30.0 - 36.0 g/dL   RDW 13.9 11.5 - 15.5 %   Platelets 304 150 - 400 K/uL   nRBC 0.0 0.0 - 0.2 %   Neutrophils Relative % 73 %   Neutro Abs 6.5 1.7 - 7.7 K/uL   Lymphocytes Relative 19 %   Lymphs Abs 1.7 0.7 - 4.0 K/uL   Monocytes Relative 7 %   Monocytes Absolute 0.6 0.1 - 1.0 K/uL   Eosinophils Relative 1 %   Eosinophils Absolute 0.1 0.0 - 0.5 K/uL   Basophils Relative 0 %   Basophils Absolute 0.0 0.0 - 0.1 K/uL   Immature Granulocytes 0 %   Abs Immature Granulocytes 0.04 0.00 - 0.07 K/uL  Comprehensive metabolic panel     Status: Abnormal   Collection Time: 09/19/19  4:13 PM  Result Value Ref Range   Sodium 137 135 - 145 mmol/L   Potassium 3.9 3.5 - 5.1 mmol/L   Chloride 104 98 - 111 mmol/L   CO2 26 22 - 32 mmol/L   Glucose, Bld 171 (H) 70 - 99 mg/dL   BUN 14 6 - 20 mg/dL   Creatinine, Ser 0.79 0.44 - 1.00 mg/dL   Calcium 9.0 8.9 - 10.3 mg/dL   Total Protein 6.9 6.5 - 8.1 g/dL   Albumin 3.8 3.5 - 5.0 g/dL   AST 20 15 - 41 U/L   ALT 17 0 - 44 U/L   Alkaline Phosphatase 61 38 - 126 U/L   Total Bilirubin 0.6 0.3 - 1.2 mg/dL   GFR calc non Af Amer >60 >60 mL/min   GFR calc Af Amer >60 >60 mL/min   Anion  gap 7 5 - 15  Troponin I (High Sensitivity)     Status: None   Collection Time: 09/19/19  4:13 PM  Result Value Ref Range   Troponin I (High Sensitivity) 2 <18 ng/L  Troponin I (High Sensitivity)     Status: None   Collection Time: 09/19/19  6:15 PM  Result Value Ref Range   Troponin I (High Sensitivity) 2 <18 ng/L   ____________________________________________  EKG My review and personal interpretation at Time: 16:17   Indication: sob  Rate:  85  Rhythm: sinus Axis: normal Other: inferolateral t wave inversions, no stemi ____________________________________________  RADIOLOGY  I personally reviewed all radiographic images ordered to evaluate for the above acute complaints and reviewed radiology reports and findings.  These findings were personally discussed with the patient.  Please see medical record for radiology report.  ____________________________________________   PROCEDURES  Procedure(s) performed:  Procedures    Critical Care performed: no ____________________________________________   INITIAL IMPRESSION / ASSESSMENT AND PLAN / ED COURSE  Pertinent labs & imaging results that were available during my care of the patient were reviewed by me and considered in my medical decision making (see chart for details).   DDX: astham bronchitis, chf, pna, edema, pe, acs  Enisa Runyan is a 44 y.o. who presents to the ED with symptoms as described above.  Patient exceedingly well-appearing clinically.  She is in no acute distress.  Denies any active chest pain.  Will order serial enzymes.  Does not seem clinically consistent with PE.  No evidence of infiltrate.  No respiratory symptoms at this time.  I do believe she stable appropriate for outpatient follow-up.     The patient was evaluated in Emergency Department today for the symptoms described in the history of present illness. He/she was evaluated in the context of the global COVID-19 pandemic, which necessitated  consideration that the patient might be at risk for infection with the SARS-CoV-2 virus that causes COVID-19. Institutional protocols and algorithms that pertain to the evaluation of patients at risk for COVID-19 are in a state of rapid change based on information released by regulatory bodies including the CDC and federal and state organizations. These policies and algorithms were followed during the patient's care in the ED.  As part of my medical decision making, I reviewed the following data within the electronic MEDICAL RECORD NUMBER Nursing notes reviewed and incorporated, Labs reviewed, notes from prior ED visits and [place] Controlled Substance Database   ____________________________________________   FINAL CLINICAL IMPRESSION(S) / ED DIAGNOSES  Final diagnoses:  SOB (shortness of breath)      NEW MEDICATIONS STARTED DURING THIS VISIT:  New Prescriptions   ALBUTEROL (VENTOLIN HFA) 108 (90 BASE) MCG/ACT INHALER    Inhale 2 puffs into the lungs every 6 (six) hours as needed for wheezing or shortness of breath.     Note:  This document was prepared using Dragon voice recognition software and may include unintentional dictation errors.    Willy Eddy, MD 09/19/19 1919

## 2019-09-19 NOTE — ED Notes (Addendum)
Pt with SOB with exertion when it is cold outside, reappeared today. Pt states she feels like there is a weight on her chest when walking that resolves with rest.

## 2019-09-28 ENCOUNTER — Other Ambulatory Visit: Payer: Self-pay

## 2019-09-28 ENCOUNTER — Emergency Department: Payer: Self-pay

## 2019-09-28 DIAGNOSIS — J45909 Unspecified asthma, uncomplicated: Secondary | ICD-10-CM | POA: Insufficient documentation

## 2019-09-28 DIAGNOSIS — Z7982 Long term (current) use of aspirin: Secondary | ICD-10-CM | POA: Insufficient documentation

## 2019-09-28 DIAGNOSIS — I10 Essential (primary) hypertension: Secondary | ICD-10-CM | POA: Insufficient documentation

## 2019-09-28 DIAGNOSIS — E119 Type 2 diabetes mellitus without complications: Secondary | ICD-10-CM | POA: Insufficient documentation

## 2019-09-28 DIAGNOSIS — R0789 Other chest pain: Secondary | ICD-10-CM | POA: Insufficient documentation

## 2019-09-28 DIAGNOSIS — Z79899 Other long term (current) drug therapy: Secondary | ICD-10-CM | POA: Insufficient documentation

## 2019-09-28 LAB — TROPONIN I (HIGH SENSITIVITY): Troponin I (High Sensitivity): <2 ng/L (ref <18)

## 2019-09-28 LAB — COMPREHENSIVE METABOLIC PANEL
ALT: 17 U/L (ref 0–44)
AST: 18 U/L (ref 15–41)
Albumin: 4 g/dL (ref 3.5–5.0)
Alkaline Phosphatase: 66 U/L (ref 38–126)
Anion gap: 12 (ref 5–15)
BUN: 17 mg/dL (ref 6–20)
CO2: 21 mmol/L — ABNORMAL LOW (ref 22–32)
Calcium: 8.9 mg/dL (ref 8.9–10.3)
Chloride: 102 mmol/L (ref 98–111)
Creatinine, Ser: 0.72 mg/dL (ref 0.44–1.00)
GFR calc Af Amer: >60 mL/min (ref >60)
GFR calc non Af Amer: >60 mL/min (ref >60)
Glucose, Bld: 181 mg/dL — ABNORMAL HIGH (ref 70–99)
Potassium: 3.7 mmol/L (ref 3.5–5.1)
Sodium: 135 mmol/L (ref 135–145)
Total Bilirubin: 0.5 mg/dL (ref 0.3–1.2)
Total Protein: 7.5 g/dL (ref 6.5–8.1)

## 2019-09-28 LAB — CBC
HCT: 35.6 % — ABNORMAL LOW (ref 36.0–46.0)
Hemoglobin: 11.3 g/dL — ABNORMAL LOW (ref 12.0–15.0)
MCH: 25.9 pg — ABNORMAL LOW (ref 26.0–34.0)
MCHC: 31.7 g/dL (ref 30.0–36.0)
MCV: 81.5 fL (ref 80.0–100.0)
Platelets: 303 10*3/uL (ref 150–400)
RBC: 4.37 MIL/uL (ref 3.87–5.11)
RDW: 14.1 % (ref 11.5–15.5)
WBC: 9.6 10*3/uL (ref 4.0–10.5)
nRBC: 0 % (ref 0.0–0.2)

## 2019-09-28 NOTE — ED Triage Notes (Addendum)
Pt states has left sided chest pain off and on for weeks. Pt states she was recently admitted to [facility] for ekg changes with chest pain. Pt states pain is worse with ambulation, pt states she does feel intermittently shob, denies cough, fever. Pt describes discomfort today as tightness.

## 2019-09-29 ENCOUNTER — Emergency Department
Admission: EM | Admit: 2019-09-29 | Discharge: 2019-09-29 | Disposition: A | Discharge status: Home / Self Care | Payer: Self-pay | Attending: Emergency Medicine | Admitting: Emergency Medicine

## 2019-09-29 DIAGNOSIS — R079 Chest pain, unspecified: Secondary | ICD-10-CM

## 2019-09-29 LAB — TROPONIN I (HIGH SENSITIVITY): Troponin I (High Sensitivity): <2 ng/L (ref <18)

## 2019-09-29 NOTE — ED Notes (Signed)
Pt ambulatory to snack machines without difficulty.

## 2019-09-29 NOTE — Discharge Instructions (Signed)

## 2019-09-29 NOTE — ED Provider Notes (Signed)
[facility] Emergency Department Provider Note  ____________________________________________  Time seen: Approximately 3:38 AM  I have reviewed the triage vital signs and the nursing notes.   HISTORY  Chief Complaint Chest Pain   HPI Darlene Guzman is a 44 y.o. female the history of asthma, depression, anxiety, diabetes, hypertension, obesity who presents for evaluation of chest pain.  Patient reports intermittent chest pain for over a month.  She describes the pain as a tightness in the center of her chest lasting minutes at a time.  She has been having about 2 episodes weekly.  Usually, when she is stressed or tired.  She has no shortness of breath nausea or dizziness associated with these episodes.  She was admitted at [facility] a month ago for the same and underwent a stress test and an echocardiogram which were both unremarkable.  She chews tobacco but does not smoke.  Patient reports that she just recently met her biological family and he seems like heart disease run on her father side.   She denies any personal history of blood clots, recent travel immobilization, leg pain or swelling, hemoptysis, exogenous hormones.  This evening she was working when she had another episode of chest pain.  The pain resolved without intervention.  She has been in the waiting room for 5.5 hours with no recurrence of the pain.  She works at [employer] and reports that her job is very stressful.  Past Medical History:  Diagnosis Date  . Asthma   . Depression   . Diabetes mellitus without complication ([place])   . Obesity     There are no problems to display for this patient.   Past Surgical History:  Procedure Laterality Date  . BREAST SURGERY      Prior to Admission medications   Medication Sig Start Date End Date Taking? Authorizing Provider  albuterol (VENTOLIN HFA) 108 (90 Base) MCG/ACT inhaler Inhale 2 puffs into the lungs every 6 (six) hours as needed for wheezing or  shortness of breath. 09/19/19   Merlyn Lot, MD  albuterol (VENTOLIN HFA) 108 (90 Base) MCG/ACT inhaler Inhale 2 puffs into the lungs every 4 (four) hours as needed for wheezing or shortness of breath. 09/19/19   Merlyn Lot, MD  aluminum-magnesium hydroxide-simethicone (MAALOX) 200-200-20 MG/5ML SUSP Take 30 mLs by mouth 4 (four) times daily -  before meals and at bedtime. 06/07/19   Carrie Mew, MD  amoxicillin (AMOXIL) 500 MG capsule Take 1 capsule (500 mg total) by mouth 3 (three) times daily. 09/15/18   Fransico Meadow, PA-C  aspirin EC 325 MG tablet Take 1 tablet (325 mg total) by mouth daily. 08/14/19 08/13/20  Earleen [place], MD  Budesonide (PULMICORT FLEXHALER) 90 MCG/ACT inhaler Inhale 1 puff into the lungs 2 (two) times daily. 04/02/19   Cuthriell, Charline Bills, PA-C  famotidine (PEPCID) 20 MG tablet Take 1 tablet (20 mg total) by mouth 2 (two) times daily. 06/07/19   Carrie Mew, MD  montelukast (SINGULAIR) 10 MG tablet Take 1 tablet (10 mg total) by mouth daily. 04/02/19 04/01/20  Cuthriell, Charline Bills, PA-C  predniSONE (DELTASONE) 20 MG tablet Take 2 tablets (40 mg total) by mouth daily. 06/07/19   Carrie Mew, MD    Allergies Blueberry flavor, Lidocaine, Morphine and related, Raspberry, and Novocain [procaine]  No family history on file.  Social History Social History   Tobacco Use  . Smoking status: Never Smoker  . Smokeless tobacco: Current User    Types: Chew  Substance Use Topics  . Alcohol use: No  . Drug use: No    Review of Systems  Constitutional: Negative for fever. Eyes: Negative for visual changes. ENT: Negative for sore throat. Neck: No neck pain  Cardiovascular: + chest pain. Respiratory: Negative for shortness of breath. Gastrointestinal: Negative for abdominal pain, vomiting or diarrhea. Genitourinary: Negative for dysuria. Musculoskeletal: Negative for back pain. Skin: Negative for rash. Neurological: Negative for headaches,  weakness or numbness. Psych: No SI or HI  ____________________________________________   PHYSICAL EXAM:  VITAL SIGNS: ED Triage Vitals  Enc Vitals Group     BP 09/28/19 2228 (!) 155/68     Pulse Rate 09/28/19 2228 87     Resp 09/28/19 2228 18     Temp 09/28/19 2228 98 F (36.7 C)     Temp Source 09/28/19 2228 Oral     SpO2 09/28/19 2228 98 %     Weight 09/28/19 2214 230 lb (104.3 kg)     Height 09/28/19 2214 5' (1.524 m)     Head Circumference --      Peak Flow --      Pain Score 09/28/19 2213 5     Pain Loc --      Pain Edu? --      Excl. in [place]? --     Constitutional: Alert and oriented. Well appearing and in no apparent distress. HEENT:      Head: Normocephalic and atraumatic.         Eyes: Conjunctivae are normal. Sclera is non-icteric.       Mouth/Throat: Mucous membranes are moist.       Neck: Supple with no signs of meningismus. Cardiovascular: Regular rate and rhythm. No murmurs, gallops, or rubs. 2+ symmetrical distal pulses are present in all extremities. No JVD. Respiratory: Normal respiratory effort. Lungs are clear to auscultation bilaterally. No wheezes, crackles, or rhonchi.  Gastrointestinal: Soft, non tender, and non distended with positive bowel sounds. No rebound or guarding. Musculoskeletal: Nontender with normal range of motion in all extremities. No edema, cyanosis, or erythema of extremities. Neurologic: Normal speech and language. Face is symmetric. Moving all extremities. No gross focal neurologic deficits are appreciated. Skin: Skin is warm, dry and intact. No rash noted. Psychiatric: Mood and affect are normal. Speech and behavior are normal.  ____________________________________________   LABS (all labs ordered are listed, but only abnormal results are displayed)  Labs Reviewed  CBC - Abnormal; Notable for the following components:      Result Value   Hemoglobin 11.3 (*)    HCT 35.6 (*)    MCH 25.9 (*)    All other components within  normal limits  COMPREHENSIVE METABOLIC PANEL - Abnormal; Notable for the following components:   CO2 21 (*)    Glucose, Bld 181 (*)    All other components within normal limits  POC URINE PREG, ED  TROPONIN I (HIGH SENSITIVITY)  TROPONIN I (HIGH SENSITIVITY)   ____________________________________________  EKG  ED ECG REPORT I, Rudene Re, the attending physician, personally viewed and interpreted this ECG.  Normal sinus rhythm, rate of 87, left bundle branch block, prolonged QTC, no concordant ST elevation.  Unchanged from prior. ____________________________________________  RADIOLOGY  I have personally reviewed the images performed during this visit and I agree with the Radiologist's read.   Interpretation by Radiologist:  DG Chest 2 View  Result Date: 09/28/2019 CLINICAL DATA:  Left-sided chest pain EXAM: CHEST - 2 VIEW COMPARISON:  September 19, 2019 FINDINGS: The  heart size and mediastinal contours are within normal limits. Both lungs are clear. The visualized skeletal structures are unremarkable. IMPRESSION: No active cardiopulmonary disease. Electronically Signed   By: Constance Holster M.D.   On: 09/28/2019 22:54     ____________________________________________   PROCEDURES  Procedure(s) performed: None Procedures Critical Care performed:  None ____________________________________________   INITIAL IMPRESSION / ASSESSMENT AND PLAN / ED COURSE   44 y.o. female the history of asthma, depression, anxiety, diabetes, hypertension, obesity who presents for evaluation of chest pain.  Symptoms have been intermittent for over a month.  Patient was admitted initially at [facility] with abnormal stress test and normal echocardiogram.  Heart score 4.  Had another episode of chest pain this evening.  Again EKG is unchanged from baseline with 2 - high-sensitivity troponins.  Patient is PERC negative.  It seems like these episodes happen in the setting of stress or when patient is  tired which could be stress related.  Recommended follow-up with her cardiologist.  Discussed my standard return precautions.       As part of my medical decision making, I reviewed the following data within the [place] notes reviewed and incorporated, Labs reviewed , EKG interpreted , Old EKG reviewed, Old chart reviewed, Radiograph reviewed , Notes from prior ED visits and [place] Controlled Substance Database   Please note:  Patient was evaluated in Emergency Department today for the symptoms described in the history of present illness. Patient was evaluated in the context of the global COVID-19 pandemic, which necessitated consideration that the patient might be at risk for infection with the SARS-CoV-2 virus that causes COVID-19. Institutional protocols and algorithms that pertain to the evaluation of patients at risk for COVID-19 are in a state of rapid change based on information released by regulatory bodies including the CDC and federal and state organizations. These policies and algorithms were followed during the patient's care in the ED.  Some ED evaluations and interventions may be delayed as a result of limited staffing during the pandemic.   ____________________________________________   FINAL CLINICAL IMPRESSION(S) / ED DIAGNOSES   Final diagnoses:  Chest pain, unspecified type      NEW MEDICATIONS STARTED DURING THIS VISIT:  ED Discharge Orders    None       Note:  This document was prepared using Dragon voice recognition software and may include unintentional dictation errors.    Alfred Levins, [place], MD 09/29/19 [phone]

## 2019-11-01 ENCOUNTER — Emergency Department: Admission: EM | Admit: 2019-11-01 | Discharge: 2019-11-01 | Discharge status: Against Medical Advice | Payer: Self-pay

## 2019-11-01 ENCOUNTER — Other Ambulatory Visit: Payer: Self-pay

## 2019-11-01 NOTE — ED Notes (Signed)
Called pt several times no answer  

## 2019-11-01 NOTE — ED Notes (Signed)
Called pt several times no answer, walked around waiting room called for name no answer

## 2019-11-01 NOTE — ED Notes (Signed)
Patient checking in for SOB. Patient refused wheelchair this RN offered

## 2019-11-19 ENCOUNTER — Telehealth: Payer: Self-pay

## 2019-11-20 NOTE — Telephone Encounter (Signed)
Created in error

## 2020-04-01 ENCOUNTER — Emergency Department (HOSPITAL_COMMUNITY)
Admission: EM | Admit: 2020-04-01 | Discharge: 2020-04-01 | Disposition: A | Discharge status: Home / Self Care | Payer: Self-pay | Attending: Emergency Medicine | Admitting: Emergency Medicine

## 2020-04-01 ENCOUNTER — Encounter (HOSPITAL_COMMUNITY): Payer: Self-pay | Admitting: Emergency Medicine

## 2020-04-01 ENCOUNTER — Emergency Department (HOSPITAL_COMMUNITY): Payer: Self-pay

## 2020-04-01 ENCOUNTER — Other Ambulatory Visit: Payer: Self-pay

## 2020-04-01 DIAGNOSIS — Z5321 Procedure and treatment not carried out due to patient leaving prior to being seen by health care provider: Secondary | ICD-10-CM | POA: Insufficient documentation

## 2020-04-01 DIAGNOSIS — I10 Essential (primary) hypertension: Secondary | ICD-10-CM | POA: Insufficient documentation

## 2020-04-01 DIAGNOSIS — R42 Dizziness and giddiness: Secondary | ICD-10-CM | POA: Insufficient documentation

## 2020-04-01 DIAGNOSIS — R0789 Other chest pain: Secondary | ICD-10-CM | POA: Insufficient documentation

## 2020-04-01 LAB — BASIC METABOLIC PANEL
Anion gap: 12 (ref 5–15)
BUN: 14 mg/dL (ref 6–20)
CO2: 22 mmol/L (ref 22–32)
Calcium: 9 mg/dL (ref 8.9–10.3)
Chloride: 100 mmol/L (ref 98–111)
Creatinine, Ser: 0.91 mg/dL (ref 0.44–1.00)
GFR calc Af Amer: >60 mL/min (ref >60)
GFR calc non Af Amer: >60 mL/min (ref >60)
Glucose, Bld: 266 mg/dL — ABNORMAL HIGH (ref 70–99)
Potassium: 3.9 mmol/L (ref 3.5–5.1)
Sodium: 134 mmol/L — ABNORMAL LOW (ref 135–145)

## 2020-04-01 LAB — CBC
HCT: 36.4 % (ref 36.0–46.0)
Hemoglobin: 11.5 g/dL — ABNORMAL LOW (ref 12.0–15.0)
MCH: 26.3 pg (ref 26.0–34.0)
MCHC: 31.6 g/dL (ref 30.0–36.0)
MCV: 83.3 fL (ref 80.0–100.0)
Platelets: 296 10*3/uL (ref 150–400)
RBC: 4.37 MIL/uL (ref 3.87–5.11)
RDW: 15.2 % (ref 11.5–15.5)
WBC: 10.5 10*3/uL (ref 4.0–10.5)
nRBC: 0 % (ref 0.0–0.2)

## 2020-04-01 LAB — I-STAT BETA HCG BLOOD, ED (MC, WL, AP ONLY): I-stat hCG, quantitative: <5 m[IU]/mL (ref <5)

## 2020-04-01 LAB — TROPONIN I (HIGH SENSITIVITY): Troponin I (High Sensitivity): 9 ng/L (ref <18)

## 2020-04-01 MED ORDER — SODIUM CHLORIDE 0.9% FLUSH: 3.0000 mL | Freq: Once | INTRAVENOUS | Status: DC

## 2020-04-01 NOTE — ED Notes (Signed)
Pt decided not to stay. Sais that she would prefer to be at home in her bed.

## 2020-04-01 NOTE — ED Triage Notes (Signed)
Pt presents to ED POV. Pt c/o crushing L CP that radiates to L neck. Pt reports that onset 93m. Pt grabbing chest in triage, found by security outside because she could not make it to door. Pt reports n, dizziness. Pt hx LBB, HTN.

## 2020-04-08 ENCOUNTER — Other Ambulatory Visit: Payer: Self-pay

## 2020-04-08 ENCOUNTER — Emergency Department (HOSPITAL_COMMUNITY): Admission: EM | Admit: 2020-04-08 | Discharge: 2020-04-08 | Discharge status: Against Medical Advice | Payer: Self-pay

## 2020-08-19 ENCOUNTER — Emergency Department (HOSPITAL_COMMUNITY): Payer: Managed Care, Other (non HMO)

## 2020-08-19 ENCOUNTER — Encounter (HOSPITAL_COMMUNITY): Payer: Self-pay | Admitting: Emergency Medicine

## 2020-08-19 ENCOUNTER — Inpatient Hospital Stay (HOSPITAL_COMMUNITY)
Admission: EM | Admit: 2020-08-19 | Discharge: 2020-08-22 | DRG: 287 | Disposition: A | Discharge status: Home / Self Care | Payer: Self-pay | Attending: Internal Medicine | Admitting: Internal Medicine

## 2020-08-19 ENCOUNTER — Other Ambulatory Visit: Payer: Self-pay

## 2020-08-19 DIAGNOSIS — E785 Hyperlipidemia, unspecified: Secondary | ICD-10-CM

## 2020-08-19 DIAGNOSIS — R778 Other specified abnormalities of plasma proteins: Secondary | ICD-10-CM

## 2020-08-19 DIAGNOSIS — I5042 Chronic combined systolic (congestive) and diastolic (congestive) heart failure: Secondary | ICD-10-CM | POA: Diagnosis present

## 2020-08-19 DIAGNOSIS — Z885 Allergy status to narcotic agent status: Secondary | ICD-10-CM

## 2020-08-19 DIAGNOSIS — R739 Hyperglycemia, unspecified: Secondary | ICD-10-CM | POA: Insufficient documentation

## 2020-08-19 DIAGNOSIS — Z79899 Other long term (current) drug therapy: Secondary | ICD-10-CM

## 2020-08-19 DIAGNOSIS — F17220 Nicotine dependence, chewing tobacco, uncomplicated: Secondary | ICD-10-CM | POA: Diagnosis present

## 2020-08-19 DIAGNOSIS — Z881 Allergy status to other antibiotic agents status: Secondary | ICD-10-CM

## 2020-08-19 DIAGNOSIS — R0789 Other chest pain: Secondary | ICD-10-CM

## 2020-08-19 DIAGNOSIS — I110 Hypertensive heart disease with heart failure: Secondary | ICD-10-CM | POA: Diagnosis present

## 2020-08-19 DIAGNOSIS — Z6841 Body Mass Index (BMI) 40.0 and over, adult: Secondary | ICD-10-CM

## 2020-08-19 DIAGNOSIS — Z5900 Homelessness unspecified: Secondary | ICD-10-CM

## 2020-08-19 DIAGNOSIS — I447 Left bundle-branch block, unspecified: Secondary | ICD-10-CM | POA: Diagnosis present

## 2020-08-19 DIAGNOSIS — R079 Chest pain, unspecified: Secondary | ICD-10-CM | POA: Diagnosis present

## 2020-08-19 DIAGNOSIS — Z884 Allergy status to anesthetic agent status: Secondary | ICD-10-CM

## 2020-08-19 DIAGNOSIS — Z888 Allergy status to other drugs, medicaments and biological substances status: Secondary | ICD-10-CM

## 2020-08-19 DIAGNOSIS — I10 Essential (primary) hypertension: Secondary | ICD-10-CM | POA: Diagnosis present

## 2020-08-19 DIAGNOSIS — Z833 Family history of diabetes mellitus: Secondary | ICD-10-CM

## 2020-08-19 DIAGNOSIS — I160 Hypertensive urgency: Secondary | ICD-10-CM | POA: Diagnosis present

## 2020-08-19 DIAGNOSIS — Z20822 Contact with and (suspected) exposure to covid-19: Secondary | ICD-10-CM | POA: Diagnosis present

## 2020-08-19 DIAGNOSIS — Z8249 Family history of ischemic heart disease and other diseases of the circulatory system: Secondary | ICD-10-CM

## 2020-08-19 DIAGNOSIS — F32A Depression, unspecified: Secondary | ICD-10-CM

## 2020-08-19 DIAGNOSIS — Z599 Problem related to housing and economic circumstances, unspecified: Secondary | ICD-10-CM

## 2020-08-19 DIAGNOSIS — Z91018 Allergy to other foods: Secondary | ICD-10-CM

## 2020-08-19 DIAGNOSIS — I200 Unstable angina: Principal | ICD-10-CM | POA: Diagnosis present

## 2020-08-19 DIAGNOSIS — E118 Type 2 diabetes mellitus with unspecified complications: Secondary | ICD-10-CM | POA: Diagnosis present

## 2020-08-19 DIAGNOSIS — I5032 Chronic diastolic (congestive) heart failure: Secondary | ICD-10-CM | POA: Insufficient documentation

## 2020-08-19 DIAGNOSIS — E1165 Type 2 diabetes mellitus with hyperglycemia: Secondary | ICD-10-CM | POA: Diagnosis present

## 2020-08-19 DIAGNOSIS — J45909 Unspecified asthma, uncomplicated: Secondary | ICD-10-CM | POA: Diagnosis present

## 2020-08-19 HISTORY — DX: Type 2 diabetes mellitus without complications: E11.9

## 2020-08-19 LAB — RAPID URINE DRUG SCREEN, HOSP PERFORMED
Amphetamines: NOT DETECTED
Barbiturates: NOT DETECTED
Benzodiazepines: NOT DETECTED
Cocaine: NOT DETECTED
Opiates: NOT DETECTED
Tetrahydrocannabinol: NOT DETECTED

## 2020-08-19 LAB — BASIC METABOLIC PANEL
Anion gap: 11 (ref 5–15)
BUN: 13 mg/dL (ref 6–20)
CO2: 22 mmol/L (ref 22–32)
Calcium: 9.1 mg/dL (ref 8.9–10.3)
Chloride: 99 mmol/L (ref 98–111)
Creatinine, Ser: 0.86 mg/dL (ref 0.44–1.00)
GFR, Estimated: >60 mL/min (ref >60)
Glucose, Bld: 366 mg/dL — ABNORMAL HIGH (ref 70–99)
Potassium: 4.1 mmol/L (ref 3.5–5.1)
Sodium: 132 mmol/L — ABNORMAL LOW (ref 135–145)

## 2020-08-19 LAB — TROPONIN I (HIGH SENSITIVITY)
Troponin I (High Sensitivity): 122 ng/L (ref <18)
Troponin I (High Sensitivity): 132 ng/L (ref <18)
Troponin I (High Sensitivity): 144 ng/L (ref <18)
Troponin I (High Sensitivity): 139 ng/L (ref <18)

## 2020-08-19 LAB — URINALYSIS, ROUTINE W REFLEX MICROSCOPIC
Bilirubin Urine: NEGATIVE
Glucose, UA: >=500 mg/dL — AB
Hgb urine dipstick: NEGATIVE
Ketones, ur: NEGATIVE mg/dL
Leukocytes,Ua: NEGATIVE
Nitrite: NEGATIVE
Protein, ur: NEGATIVE mg/dL
Specific Gravity, Urine: 1.029 (ref 1.005–1.030)
pH: 6 (ref 5.0–8.0)

## 2020-08-19 LAB — HEPATIC FUNCTION PANEL
ALT: 22 U/L (ref 0–44)
AST: 23 U/L (ref 15–41)
Albumin: 4 g/dL (ref 3.5–5.0)
Alkaline Phosphatase: 61 U/L (ref 38–126)
Bilirubin, Direct: 0.1 mg/dL (ref 0.0–0.2)
Indirect Bilirubin: 0.5 mg/dL (ref 0.3–0.9)
Total Bilirubin: 0.6 mg/dL (ref 0.3–1.2)
Total Protein: 7.3 g/dL (ref 6.5–8.1)

## 2020-08-19 LAB — CBC
HCT: 40.7 % (ref 36.0–46.0)
Hemoglobin: 13.5 g/dL (ref 12.0–15.0)
MCH: 27.7 pg (ref 26.0–34.0)
MCHC: 33.2 g/dL (ref 30.0–36.0)
MCV: 83.4 fL (ref 80.0–100.0)
Platelets: 272 10*3/uL (ref 150–400)
RBC: 4.88 MIL/uL (ref 3.87–5.11)
RDW: 14.7 % (ref 11.5–15.5)
WBC: 8.2 10*3/uL (ref 4.0–10.5)
nRBC: 0 % (ref 0.0–0.2)

## 2020-08-19 LAB — I-STAT BETA HCG BLOOD, ED (MC, WL, AP ONLY): I-stat hCG, quantitative: <5 m[IU]/mL (ref <5)

## 2020-08-19 LAB — CBG MONITORING, ED
Glucose-Capillary: 384 mg/dL — ABNORMAL HIGH (ref 70–99)
Glucose-Capillary: 196 mg/dL — ABNORMAL HIGH (ref 70–99)
Glucose-Capillary: 178 mg/dL — ABNORMAL HIGH (ref 70–99)

## 2020-08-19 LAB — D-DIMER, QUANTITATIVE: D-Dimer, Quant: 0.38 ug/mL-FEU (ref 0.00–0.50)

## 2020-08-19 LAB — RESP PANEL BY RT-PCR (FLU A&B, COVID) ARPGX2
Influenza A by PCR: NEGATIVE
Influenza B by PCR: NEGATIVE
SARS Coronavirus 2 by RT PCR: NEGATIVE

## 2020-08-19 LAB — BRAIN NATRIURETIC PEPTIDE: B Natriuretic Peptide: 51.5 pg/mL (ref 0.0–100.0)

## 2020-08-19 LAB — HEMOGLOBIN A1C
Hgb A1c MFr Bld: 8.8 % — ABNORMAL HIGH (ref 4.8–5.6)
Mean Plasma Glucose: 205.86 mg/dL

## 2020-08-19 LAB — LIPASE, BLOOD: Lipase: 33 U/L (ref 11–51)

## 2020-08-19 LAB — MAGNESIUM: Magnesium: 1.9 mg/dL (ref 1.7–2.4)

## 2020-08-19 LAB — ETHANOL: Alcohol, Ethyl (B): <10 mg/dL (ref <10)

## 2020-08-19 MED ORDER — LISINOPRIL 40 MG PO TABS
40.0000 mg | ORAL_TABLET | Freq: Every day | ORAL | Status: DC
  Administered 2020-08-20 – 2020-08-22 (×3): 40 mg via ORAL
  Filled 2020-08-19 (×2): qty 1
  Filled 2020-08-19: qty 2

## 2020-08-19 MED ORDER — AMITRIPTYLINE HCL 25 MG PO TABS
25.0000 mg | ORAL_TABLET | Freq: Every day | ORAL | Status: DC
  Administered 2020-08-19 – 2020-08-21 (×3): 25 mg via ORAL
  Filled 2020-08-19 (×3): qty 1

## 2020-08-19 MED ORDER — ACETAMINOPHEN 650 MG RE SUPP: 650.0000 mg | Freq: Four times a day (QID) | RECTAL | Status: DC | PRN

## 2020-08-19 MED ORDER — ENOXAPARIN SODIUM 40 MG/0.4ML ~~LOC~~ SOLN
40.0000 mg | SUBCUTANEOUS | Status: DC
  Filled 2020-08-19: qty 0.4

## 2020-08-19 MED ORDER — INSULIN ASPART 100 UNIT/ML ~~LOC~~ SOLN
0.0000 [IU] | SUBCUTANEOUS | Status: DC
  Administered 2020-08-19: 2 [IU] via SUBCUTANEOUS
  Administered 2020-08-20: 5 [IU] via SUBCUTANEOUS
  Administered 2020-08-20 (×2): 3 [IU] via SUBCUTANEOUS
  Administered 2020-08-20 (×2): 2 [IU] via SUBCUTANEOUS
  Filled 2020-08-19: qty 0.09

## 2020-08-19 MED ORDER — ASPIRIN EC 81 MG PO TBEC
81.0000 mg | DELAYED_RELEASE_TABLET | Freq: Every day | ORAL | Status: DC
  Administered 2020-08-19 – 2020-08-22 (×4): 81 mg via ORAL
  Filled 2020-08-19 (×4): qty 1

## 2020-08-19 MED ORDER — SODIUM CHLORIDE 0.9 % IV SOLN
75.0000 mL/h | INTRAVENOUS | Status: AC
  Administered 2020-08-19: 75 mL/h via INTRAVENOUS

## 2020-08-19 MED ORDER — SODIUM CHLORIDE 0.9 % IV BOLUS
500.0000 mL | Freq: Once | INTRAVENOUS | Status: AC
  Administered 2020-08-20: 500 mL via INTRAVENOUS

## 2020-08-19 MED ORDER — LACTATED RINGERS IV BOLUS
1000.0000 mL | Freq: Once | INTRAVENOUS | Status: AC
  Administered 2020-08-19: 1000 mL via INTRAVENOUS

## 2020-08-19 MED ORDER — NICOTINE 14 MG/24HR TD PT24
14.0000 mg | MEDICATED_PATCH | Freq: Every day | TRANSDERMAL | Status: DC
  Administered 2020-08-20 (×2): 14 mg via TRANSDERMAL
  Filled 2020-08-19 (×3): qty 1

## 2020-08-19 MED ORDER — ACETAMINOPHEN 325 MG PO TABS
650.0000 mg | ORAL_TABLET | Freq: Four times a day (QID) | ORAL | Status: DC | PRN
  Administered 2020-08-20 – 2020-08-21 (×2): 650 mg via ORAL
  Filled 2020-08-19 (×2): qty 2

## 2020-08-19 NOTE — ED Notes (Signed)
[facility] [id number] notified  See orders

## 2020-08-19 NOTE — ED Provider Notes (Signed)
[facility]-EMERGENCY DEPT Provider Note   CSN: [id number] Arrival date & time: 08/19/20  1434     History Chief Complaint  Patient presents with  . Hyperglycemia    Darlene Guzman is a 44 y.o. female with a past medical history of asthma, depression, DM 2, obesity, who presents today for evaluation of multiple complaints.  She states that she has blurred vision and shaking since yesterday.  She feels like this is related to her diabetes stating when her sugar gets high she has felt this way before.  She reports compliance with all of her medications.  She states she has not missed any doses of her lisinopril, Lasix, or Metformin in the past few weeks.  She denies any fevers or headache.  No trauma.  She also reports that earlier this morning she had about half an hour episode of chest pain radiating into her jaw on both sides.  She states that this is happened before.  She denies any diaphoresis or vomiting during this episode.     She denies access to diabetes supplies.  Review shows that she was seen with type facility on 07/29/2020.  She had no complaints at that time.  Her blood sugar then was 292 with an A1c of 8.6.  She is reportedly there on amlodipine, lisinopril, Metformin, and glipizide.  PCP reports that she is working with financial counseling to get coverage.    At that point they had a trial of stopping amlodipine and increasing lisinopril to 40 mg daily, continuing with Lasix.  She is also supposed to be continuing with her Metformin and glipizide. HPI     Past Medical History:  Diagnosis Date  . Asthma   . Depression   . Diabetes (HCC)   . Diabetes mellitus without complication (HCC)   . Obesity     There are no problems to display for this patient.   Past Surgical History:  Procedure Laterality Date  . BREAST SURGERY       OB History   No obstetric history on file.     No family history on file.  Social History   Tobacco  Use  . Smoking status: Never Smoker  . Smokeless tobacco: Current User    Types: Chew  Substance Use Topics  . Alcohol use: No  . Drug use: No    Home Medications Prior to Admission medications   Medication Sig Start Date End Date Taking? Authorizing Provider  albuterol (VENTOLIN HFA) 108 (90 Base) MCG/ACT inhaler Inhale 2 puffs into the lungs every 6 (six) hours as needed for wheezing or shortness of breath. 09/19/19  Yes Willy Eddy, MD  amitriptyline (ELAVIL) 25 MG tablet Take 25 mg by mouth at bedtime.   Yes [provider]  furosemide (LASIX) 20 MG tablet Take 20 mg by mouth 2 (two) times daily.   Yes [provider]  lisinopril (ZESTRIL) 40 MG tablet Take 40 mg by mouth daily.   Yes [provider]  Magnesium 125 MG CAPS Take 125 mg by mouth daily.   Yes [provider]  metFORMIN (GLUCOPHAGE) 500 MG tablet Take 500 mg by mouth 2 (two) times daily with a meal.   Yes [provider]  naproxen sodium (ALEVE) 220 MG tablet Take 440 mg by mouth daily as needed (pain).   Yes [provider]  Simethicone (GAS-X PO) Take 1 tablet by mouth daily as needed (indigestion).   Yes [provider]  albuterol (VENTOLIN HFA)  108 (90 Base) MCG/ACT inhaler Inhale 2 puffs into the lungs every 4 (four) hours as needed for wheezing or shortness of breath. Patient not taking: Reported on 08/19/2020 09/19/19   Willy Eddyobinson, Patrick, MD  aluminum-magnesium hydroxide-simethicone (MAALOX) 200-200-20 MG/5ML SUSP Take 30 mLs by mouth 4 (four) times daily -  before meals and at bedtime. Patient not taking: Reported on 08/19/2020 06/07/19   Sharman CheekStafford, Phillip, MD  amoxicillin (AMOXIL) 500 MG capsule Take 1 capsule (500 mg total) by mouth 3 (three) times daily. Patient not taking: Reported on 08/19/2020 09/15/18   Elson AreasSofia, Leslie K, PA-C  Budesonide (PULMICORT FLEXHALER) 90 MCG/ACT inhaler Inhale 1 puff into the lungs 2 (two) times daily. Patient not taking:  Reported on 08/19/2020 04/02/19   Cuthriell, Delorise RoyalsJonathan D, PA-C  famotidine (PEPCID) 20 MG tablet Take 1 tablet (20 mg total) by mouth 2 (two) times daily. Patient not taking: Reported on 08/19/2020 06/07/19   Sharman CheekStafford, Phillip, MD  montelukast (SINGULAIR) 10 MG tablet Take 1 tablet (10 mg total) by mouth daily. Patient not taking: Reported on 08/19/2020 04/02/19 04/01/20  Cuthriell, Delorise RoyalsJonathan D, PA-C  predniSONE (DELTASONE) 20 MG tablet Take 2 tablets (40 mg total) by mouth daily. Patient not taking: Reported on 08/19/2020 06/07/19   Sharman CheekStafford, Phillip, MD    Allergies    Azithromycin, Blueberry flavor, Celexa [citalopram], Lidocaine, Morphine and related, Raspberry, and Novocain [procaine]  Review of Systems   Review of Systems  Constitutional: Negative for chills and fever.  HENT: Negative for congestion.   Eyes: Positive for visual disturbance.  Respiratory: Negative for cough and shortness of breath.   Cardiovascular: Positive for chest pain. Negative for palpitations and leg swelling.  Gastrointestinal: Negative for abdominal pain, diarrhea, nausea and vomiting.  Endocrine: Positive for polydipsia and polyuria.  Musculoskeletal: Negative for back pain and neck pain.  Skin: Negative for color change, pallor and wound.  Neurological: Negative for speech difficulty, weakness, light-headedness and headaches.  All other systems reviewed and are negative.   Physical Exam Updated Vital Signs BP (!) 158/147   Pulse 96   Temp 97.7 F (36.5 C) (Oral)   Resp 19   LMP 07/29/2020 (Approximate)   SpO2 94%   Physical Exam Vitals and nursing note reviewed.  Constitutional:      General: She is not in acute distress.    Appearance: She is well-developed. She is obese.  HENT:     Head: Normocephalic and atraumatic.  Eyes:     Conjunctiva/sclera: Conjunctivae normal.  Cardiovascular:     Rate and Rhythm: Normal rate and regular rhythm.     Pulses: Normal pulses.     Heart sounds: Normal heart  sounds. No murmur heard.   Pulmonary:     Effort: Pulmonary effort is normal. No respiratory distress.     Breath sounds: Normal breath sounds.  Abdominal:     Palpations: Abdomen is soft.     Tenderness: There is no abdominal tenderness.  Musculoskeletal:     Cervical back: Normal range of motion and neck supple. No rigidity.     Right lower leg: No edema.     Left lower leg: No edema.  Skin:    General: Skin is warm and dry.  Neurological:     Mental Status: She is alert.     Comments: Patient is awake and alert, answers questions appropriately.  Speech is not slurred.  She has 5/5 strength in bilateral upper and lower extremities.  Facial movements are grossly symmetric.  Full EOMs.  Patient reports subjective visual changes however is unable to describe these changes clearly stating that things look both more intense and less sharp at the same time.  Psychiatric:        Mood and Affect: Mood normal.        Behavior: Behavior normal.     ED Results / Procedures / Treatments   Labs (all labs ordered are listed, but only abnormal results are displayed) Labs Reviewed  BASIC METABOLIC PANEL - Abnormal; Notable for the following components:      Result Value   Sodium 132 (*)    Glucose, Bld 366 (*)    All other components within normal limits  URINALYSIS, ROUTINE W REFLEX MICROSCOPIC - Abnormal; Notable for the following components:   APPearance HAZY (*)    Glucose, UA >=500 (*)    Bacteria, UA MANY (*)    All other components within normal limits  CBG MONITORING, ED - Abnormal; Notable for the following components:   Glucose-Capillary 384 (*)    All other components within normal limits  CBG MONITORING, ED - Abnormal; Notable for the following components:   Glucose-Capillary 196 (*)    All other components within normal limits  TROPONIN I (HIGH SENSITIVITY) - Abnormal; Notable for the following components:   Troponin I (High Sensitivity) 122 (*)    All other components  within normal limits  URINE CULTURE  RESP PANEL BY RT-PCR (FLU A&B, COVID) ARPGX2  CBC  LIPASE, BLOOD  HEPATIC FUNCTION PANEL  D-DIMER, QUANTITATIVE (NOT AT [facility])  RAPID URINE DRUG SCREEN, HOSP PERFORMED  BRAIN NATRIURETIC PEPTIDE  MAGNESIUM  I-STAT BETA HCG BLOOD, ED ([facility], [facility], [facility] ONLY)  CBG MONITORING, ED  TROPONIN I (HIGH SENSITIVITY)    EKG EKG Interpretation  Date/Time:  Wednesday August 19 2020 15:42:31 EST Ventricular Rate:  102 PR Interval:    QRS Duration: 146 QT Interval:  389 QTC Calculation: 512 R Axis:   53 Text Interpretation: Sinus tachycardia Consider right atrial enlargement Left bundle branch block No significant change since 04/01/2020 Confirmed by Geoffery Lyons ([id number]) on 08/19/2020 3:46:43 PM   Radiology DG Chest 2 View  Result Date: 08/19/2020 CLINICAL DATA:  Shortness of breath. EXAM: CHEST - 2 VIEW COMPARISON:  April 01, 2020 FINDINGS: The heart size and mediastinal contours are within normal limits. Both lungs are clear. The visualized skeletal structures are unremarkable. IMPRESSION: No active cardiopulmonary disease. Electronically Signed   By: Aram Candela M.D.   On: 08/19/2020 16:36   CT Head Wo Contrast  Result Date: 08/19/2020 CLINICAL DATA:  Diplopia. EXAM: CT HEAD WITHOUT CONTRAST TECHNIQUE: Contiguous axial images were obtained from the base of the skull through the vertex without intravenous contrast. COMPARISON:  June 02, 2007 FINDINGS: Brain: No evidence of acute infarction, hemorrhage, hydrocephalus, extra-axial collection or mass lesion/mass effect. Vascular: No hyperdense vessel or unexpected calcification. Skull: Normal. Negative for fracture or focal lesion. Sinuses/Orbits: No acute finding. Other: None. IMPRESSION: No acute intracranial pathology. Electronically Signed   By: Aram Candela M.D.   On: 08/19/2020 16:35    Procedures Procedures (including critical care time)  Medications Ordered in ED Medications  lactated  ringers bolus 1,000 mL (0 mLs Intravenous Stopped 08/19/20 2127)    ED Course  I have reviewed the triage vital signs and the nursing notes.  Pertinent labs & imaging results that were available during my care of the patient were reviewed by me and considered in my medical decision making (see chart for details).  MDM Rules/Calculators/A&P                         Patient is a 44 year old woman who presents today for evaluation of hyperglycemia and subjective visual changes. She is unable to clearly describe these vision changes. She is hypertensive here.  CT head obtained without intracranial hemorrhage or other acute abnormality.  The chest x-ray without acute abnormalities.  EKG without ischemia, does show known bundle branch block.  Her troponin is elevated.  D-dimer is normal.  She is treated with IV fluid for her hyperglycemia.  Pregnancy test is negative.  UDS is negative.  Given her elevated troponin which appears to be acute she will require admission.  I spoke with Dr. Adela Glimpse who will see patient for admission.  Note: Portions of this report may have been transcribed using voice recognition software. Every effort was made to ensure accuracy; however, inadvertent computerized transcription errors may be present  Final Clinical Impression(s) / ED Diagnoses Final diagnoses:  Hypertension, unspecified type  Elevated troponin  Chest pain, unspecified type  Hyperglycemia    Rx / DC Orders ED Discharge Orders    None       Norman Clay 08/19/20 2303    Geoffery Lyons, MD 08/21/20 2321

## 2020-08-19 NOTE — H&P (Signed)
Darlene Guzman HCW:[id number] DOB: May 10, 1976 DOA: 08/19/2020     PCP: Eyvonne Mechanic, FNP   Outpatient Specialists:  patient has established care with multiple facilities in the area  no cardiologist  Patient arrived to ER on 08/19/20 at 1434 Referred by Attending Geoffery Lyons, MD   Patient coming from: homeless    Chief Complaint:  Chief Complaint  Patient presents with  . Hyperglycemia    HPI: Darlene Guzman is a 44 y.o. female with medical history significant of homelessness, DM2, HTN,, depression,      Presented with  24h of blurred vision and chest pain, have had increased urination, and polydipsia/ patient is diabetic has hard time checking BG due to homelessness.  Today reported CP2 days AGO  associaed with SOB when she woke up radiating to her jaw on both sides similar to prior.  No associated diaphoresis no nausea.  Lasted for about 30 minutes. Now CP free Reports hx of bilateral leg swelling chronic for which she takes LAsix She had a negative stress test 1 year ago with preserved EF  reports interment abdominal distention that happens often after she eats has been going on for quite some time and she has discussed this with her primary care provider in the past Also reports feeling somewhat lightheaded when she stands up  she chews tobacco  no EtOH No drug abuse Infectious risk factors:  Reports , shortness of breath , chest pain   Has   been vaccinated against COVID last vaccine October   Initial COVID TEST  NEGATIVE   Lab Results  Component Value Date   SARSCOV2NAA NEGATIVE 08/19/2020   SARSCOV2NAA NEGATIVE 08/20/2019     Regarding pertinent Chronic problems:      HTN on lisinopril and Lasix Her Norvasc has been stopped  DM 2 - on PO meds only, Metformin was on  Glipizide  History of asthma dyspnea with exertion mild on albuterol as needed   Morbid obesity-   BMI Readings from Last 1 Encounters:  09/28/19 44.92 kg/m      Hospitalist  was called for admission for chest pain and elevated troponin in the setting hypertension and hyperglycemia  The following Work up has been ordered so far:  Orders Placed This Encounter  Procedures  . Urine culture  . Resp Panel by RT-PCR (Flu A&B, Covid) Nasopharyngeal Swab  . DG Chest 2 View  . CT Head Wo Contrast  . Basic metabolic panel  . CBC  . Urinalysis, Routine w reflex microscopic  . Lipase, blood  . Hepatic function panel  . D-dimer, quantitative (not at [facility])  . Rapid urine drug screen (hospital performed)  . Brain natriuretic peptide  . Magnesium  . Cardiac monitoring  . Visual acuity screening  . Consult to Transition of Care Team (SW and CM)  . Consult to hospitalist  ALL PATIENTS BEING ADMITTED/HAVING PROCEDURES NEED COVID-19 SCREENING  . Pulse oximetry, continuous  . CBG monitoring, ED  . CBG monitoring, ED  . I-Stat beta hCG blood, ED  . ED EKG  . EKG 12-Lead     Following Medications were ordered in ER: Medications  lactated ringers bolus 1,000 mL (1,000 mLs Intravenous New Bag/Given 08/19/20 1554)        Consult Orders  (From admission, onward)         Start     Ordered   08/19/20 1802  Consult to hospitalist  ALL PATIENTS BEING ADMITTED/HAVING PROCEDURES NEED COVID-19 SCREENING  Once  Comments: ALL PATIENTS BEING ADMITTED/HAVING PROCEDURES NEED COVID-19 SCREENING  Provider:  (Not yet assigned)  Question Answer Comment  Place call to: [facility]   Reason for Consult Admit      08/19/20 1801          Significant initial  Findings: Abnormal Labs Reviewed  BASIC METABOLIC PANEL - Abnormal; Notable for the following components:      Result Value   Sodium 132 (*)    Glucose, Bld 366 (*)    All other components within normal limits  URINALYSIS, ROUTINE W REFLEX MICROSCOPIC - Abnormal; Notable for the following components:   APPearance HAZY (*)    Glucose, UA >=500 (*)    Bacteria, UA MANY (*)    All other components within  normal limits  CBG MONITORING, ED - Abnormal; Notable for the following components:   Glucose-Capillary 384 (*)    All other components within normal limits  TROPONIN I (HIGH SENSITIVITY) - Abnormal; Notable for the following components:   Troponin I (High Sensitivity) 122 (*)    All other components within normal limits    Otherwise labs showing:    Recent Labs  Lab 08/19/20 1509  NA 132*  K 4.1  CO2 22  GLUCOSE 366*  BUN 13  CREATININE 0.86  CALCIUM 9.1    Cr   stable,   Lab Results  Component Value Date   CREATININE 0.86 08/19/2020   CREATININE 0.91 04/01/2020   CREATININE 0.72 09/28/2019    Recent Labs  Lab 08/19/20 1530  AST 23  ALT 22  ALKPHOS 61  BILITOT 0.6  PROT 7.3  ALBUMIN 4.0   Lab Results  Component Value Date   CALCIUM 9.1 08/19/2020     WBC      Component Value Date/Time   WBC 8.2 08/19/2020 1509   LYMPHSABS 1.7 09/19/2019 1613   MONOABS 0.6 09/19/2019 1613   EOSABS 0.1 09/19/2019 1613   BASOSABS 0.0 09/19/2019 1613    Plt: Lab Results  Component Value Date   PLT 272 08/19/2020     COVID-19 Labs  Recent Labs    08/19/20 1651  DDIMER 0.38    Lab Results  Component Value Date   SARSCOV2NAA NEGATIVE 08/20/2019     HG/HCT  stable,       Component Value Date/Time   HGB 13.5 08/19/2020 1509   HCT 40.7 08/19/2020 1509   MCV 83.4 08/19/2020 1509    Recent Labs  Lab 08/19/20 1530  LIPASE 33   No results for input(s): AMMONIA in the last 168 hours.    Troponin 122 - 132 -144-139   ECG: Ordered Personally reviewed by me showing: HR : 102 Rhythm:  LBBB,   Right atrial enlargement QTC 512  BNP (last 3 results) Recent Labs    08/19/20 1509  BNP 51.5      DM  labs:  HbA1C: Recent Labs    08/19/20 1954  HGBA1C 8.8*       CBG (last 3)  Recent Labs    08/19/20 1447  GLUCAP 384*    UA   no evidence of UTI    Urine analysis:    Component Value Date/Time   COLORURINE YELLOW 08/19/2020 1506    APPEARANCEUR HAZY (A) 08/19/2020 1506   LABSPEC 1.029 08/19/2020 1506   PHURINE 6.0 08/19/2020 1506   GLUCOSEU >=500 (A) 08/19/2020 1506   HGBUR NEGATIVE 08/19/2020 1506   BILIRUBINUR NEGATIVE 08/19/2020 1506   KETONESUR NEGATIVE 08/19/2020 1506  PROTEINUR NEGATIVE 08/19/2020 1506   UROBILINOGEN 0.2 07/05/2018 1530   NITRITE NEGATIVE 08/19/2020 1506   LEUKOCYTESUR NEGATIVE 08/19/2020 1506      Ordered  CT HEAD   NON acute  CXR - NON acute    ED Triage Vitals  Enc Vitals Group     BP 08/19/20 1442 (!) 167/90     Pulse Rate 08/19/20 1442 100     Resp 08/19/20 1442 20     Temp 08/19/20 1442 97.7 F (36.5 C)     Temp Source 08/19/20 1442 Oral     SpO2 08/19/20 1442 96 %     Weight --      Height --      Head Circumference --      Peak Flow --      Pain Score 08/19/20 1455 0     Pain Loc --      Pain Edu? --      Excl. in GC? --   TMAX(24)@       Latest  Blood pressure (!) 158/147, pulse 96, temperature 97.7 F (36.5 C), temperature source Oral, resp. rate 19, last menstrual period 07/29/2020, SpO2 94 %.      Review of Systems:    Pertinent positives include:  fatigue, chest pain,  Constitutional:  No weight loss, night sweats, Fevers, chills,weight loss  HEENT:  No headaches, Difficulty swallowing,Tooth/dental problems,Sore throat,  No sneezing, itching, ear ache, nasal congestion, post nasal drip,  Cardio-vascular:  No  Orthopnea, PND, anasarca, dizziness, palpitations.no Bilateral lower extremity swelling  GI:  No heartburn, indigestion, abdominal pain, nausea, vomiting, diarrhea, change in bowel habits, loss of appetite, melena, blood in stool, hematemesis Resp:  no shortness of breath at rest. No dyspnea on exertion, No excess mucus, no productive cough, No non-productive cough, No coughing up of blood.No change in color of mucus. No wheezing. Skin:  no rash or lesions. No jaundice GU:  no dysuria, change in color of urine, no urgency or frequency. No  straining to urinate.  No flank pain.  Musculoskeletal:  No joint pain or no joint swelling. No decreased range of motion. No back pain.  Psych:  No change in mood or affect. No depression or anxiety. No memory loss.  Neuro: no localizing neurological complaints, no tingling, no weakness, no double vision, no gait abnormality, no slurred speech, no confusion  All systems reviewed and apart from HOPI all are negative  Past Medical History:   Past Medical History:  Diagnosis Date  . Asthma   . Depression   . Diabetes (HCC)   . Diabetes mellitus without complication (HCC)   . Obesity      Past Surgical History:  Procedure Laterality Date  . BREAST SURGERY      Social History:  Ambulatory   independently       reports that she has never smoked. Her smokeless tobacco use includes chew. She reports that she does not drink alcohol and does not use drugs.      Family History:   Family History  Problem Relation Age of Onset  . CAD Father   . Diabetes Father   . Hypertension Father     Allergies: Allergies  Allergen Reactions  . Azithromycin Nausea And Vomiting  . 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 Celexa [Citalopram]   . Lidocaine   . Morphine And Related Hives  . Raspberry Other (See Comments)  Childhood allergy reaction unknown  . Novocain [Procaine] Rash      Prior to Admission medications   Medication Sig Start Date End Date Taking? Authorizing Provider  albuterol (VENTOLIN HFA) 108 (90 Base) MCG/ACT inhaler Inhale 2 puffs into the lungs every 6 (six) hours as needed for wheezing or shortness of breath. 09/19/19  Yes Willy Eddy, MD  amitriptyline (ELAVIL) 25 MG tablet Take 25 mg by mouth at bedtime.   Yes [provider]  furosemide (LASIX) 20 MG tablet Take 20 mg by mouth 2 (two) times daily.   Yes [provider]  lisinopril (ZESTRIL) 40 MG tablet Take 40 mg by  mouth daily.   Yes [provider]  Magnesium 125 MG CAPS Take 125 mg by mouth daily.   Yes [provider]  metFORMIN (GLUCOPHAGE) 500 MG tablet Take 500 mg by mouth 2 (two) times daily with a meal.   Yes [provider]  naproxen sodium (ALEVE) 220 MG tablet Take 440 mg by mouth daily as needed (pain).   Yes [provider]  Simethicone (GAS-X PO) Take 1 tablet by mouth daily as needed (indigestion).   Yes [provider]  albuterol (VENTOLIN HFA) 108 (90 Base) MCG/ACT inhaler Inhale 2 puffs into the lungs every 4 (four) hours as needed for wheezing or shortness of breath. Patient not taking: Reported on 08/19/2020 09/19/19   Willy Eddy, MD  aluminum-magnesium hydroxide-simethicone (MAALOX) 200-200-20 MG/5ML SUSP Take 30 mLs by mouth 4 (four) times daily -  before meals and at bedtime. Patient not taking: Reported on 08/19/2020 06/07/19   Sharman Cheek, MD  amoxicillin (AMOXIL) 500 MG capsule Take 1 capsule (500 mg total) by mouth 3 (three) times daily. Patient not taking: Reported on 08/19/2020 09/15/18   Elson Areas, PA-C  Budesonide (PULMICORT FLEXHALER) 90 MCG/ACT inhaler Inhale 1 puff into the lungs 2 (two) times daily. Patient not taking: Reported on 08/19/2020 04/02/19   Cuthriell, Delorise Royals, PA-C  famotidine (PEPCID) 20 MG tablet Take 1 tablet (20 mg total) by mouth 2 (two) times daily. Patient not taking: Reported on 08/19/2020 06/07/19   Sharman Cheek, MD  montelukast (SINGULAIR) 10 MG tablet Take 1 tablet (10 mg total) by mouth daily. Patient not taking: Reported on 08/19/2020 04/02/19 04/01/20  Cuthriell, Delorise Royals, PA-C  predniSONE (DELTASONE) 20 MG tablet Take 2 tablets (40 mg total) by mouth daily. Patient not taking: Reported on 08/19/2020 06/07/19   Sharman Cheek, MD   Physical Exam: Vitals with BMI 08/19/2020 08/19/2020 08/19/2020  Height - - -  Weight - - -  BMI - - -  Systolic 158 153 161  Diastolic 147 106 096  Pulse  96 83 77    1. General:  in No  Acute distress   Chronically ill  -appearing 2. Psychological: Alert and  Oriented 3. Head/ENT:   Dry Mucous Membranes                          Head Non traumatic, neck supple                           Poor Dentition 4. SKIN:  decreased Skin turgor,  Skin clean Dry and intact no rash 5. Heart: Regular rate and rhythm no  Murmur, no Rub or gallop 6. Lungs: Clear to auscultation bilaterally, no wheezes or crackles   7. Abdomen: Soft,  non-tender, Non distended  obese  bowel sounds present 8. Lower extremities: no clubbing, cyanosis, no  edema 9. Neurologically Grossly intact, moving all 4 extremities equally  10. MSK: Normal range of motion   All other LABS:     Recent Labs  Lab 08/19/20 1509  WBC 8.2  HGB 13.5  HCT 40.7  MCV 83.4  PLT 272     Recent Labs  Lab 08/19/20 1509  NA 132*  K 4.1  CL 99  CO2 22  GLUCOSE 366*  BUN 13  CREATININE 0.86  CALCIUM 9.1     Recent Labs  Lab 08/19/20 1530  AST 23  ALT 22  ALKPHOS 61  BILITOT 0.6  PROT 7.3  ALBUMIN 4.0    Cultures:    Component Value Date/Time   SDES URINE, CLEAN CATCH 09/30/2013 1350   SPECREQUEST Normal 09/30/2013 1350   CULT  09/30/2013 1350    Multiple bacterial morphotypes present, none predominant. Suggest appropriate recollection if clinically indicated. Performed at [facility]   REPTSTATUS 10/01/2013 FINAL 09/30/2013 1350     Radiological Exams on Admission: DG Chest 2 View  Result Date: 08/19/2020 CLINICAL DATA:  Shortness of breath. EXAM: CHEST - 2 VIEW COMPARISON:  April 01, 2020 FINDINGS: The heart size and mediastinal contours are within normal limits. Both lungs are clear. The visualized skeletal structures are unremarkable. IMPRESSION: No active cardiopulmonary disease. Electronically Signed   By: Aram Candela M.D.   On: 08/19/2020 16:36   CT Head Wo Contrast  Result Date: 08/19/2020 CLINICAL DATA:  Diplopia. EXAM: CT HEAD WITHOUT  CONTRAST TECHNIQUE: Contiguous axial images were obtained from the base of the skull through the vertex without intravenous contrast. COMPARISON:  June 02, 2007 FINDINGS: Brain: No evidence of acute infarction, hemorrhage, hydrocephalus, extra-axial collection or mass lesion/mass effect. Vascular: No hyperdense vessel or unexpected calcification. Skull: Normal. Negative for fracture or focal lesion. Sinuses/Orbits: No acute finding. Other: None. IMPRESSION: No acute intracranial pathology. Electronically Signed   By: Aram Candela M.D.   On: 08/19/2020 16:35    Chart has been reviewed   Assessment/Plan  44 y.o. female with medical history significant of homelessness, DM2, HTN,, depression  Admitted for chest pain,elevated troponin and hypertension with hyperglycemia  Present on Admission: . Chest pain - 2 days ago now resolved - H=  2  ,E= 1 ,A=0 , R  2  , T  1  ,  for the  Total of 6 therefore will admit for observation and further evaluation ( Risk of MACE: Scores 0-3  of 0.9-1.7%.,  4-6: 12-16.6% , Scores ?7: 50-65% )  - troponin mildly elevated stable -No acute ischemic changes on ECG but LBBB chronic   - monitor on telemetry, cycle cardiac enzymes, obtain serial ECG and  ECHO in AM.   - Daily aspirin -  Further risk stratify with lipid panel, hgA1C, obtain TSH.  Make sure patient is on Aspirin.  We will notify cardiology regarding patient's admission. Further management depends on pending  workup  . Accelerated hypertension- resume home meds, labetalol as needed  . Hyperglycemia - BG below 400 after IV fluids initiate SSI and order diabetes coordianotr may need to start on insulin but would need to take living situation in account  . Elevated troponin -  -currently no chest pain last episode was 2 days ago no EKG changes LBBB  likely due to demand ischemia   monitor on telemetry and cycle cardiac enzymes to trend.  if continues to rise will need further  work-up D.dimer  unremarkable appreciate cardiology consult  Blurry vision in the setting of elevated blood sugar -we will control blood glucose CT head unremarkable patient would likely benefit from follow-up with ophthalmology  . DM (diabetes mellitus), type 2 with complications (HCC) with a sliding scale hold Metformin order diabetes coordinator consult  Bacteria in urine unclear if true UTI  Will culture, hold off on ABX for tonight  Homelessness will need -transitional care consult Other plan as per orders.  DVT prophylaxis:    Lovenox       Code Status:    Code Status: Not on file FULL CODE   as per patient   I had personally discussed CODE STATUS with patient      Family Communication:   Family not at  Bedside    Disposition Plan:   likely will need homeless shelter referral   Following barriers for discharge:                            Electrolytes corrected                                                             Will need to be able to tolerate PO                                                      Will need consultants to evaluate patient prior to discharge                   Would benefit from PT/OT eval prior to DC  Ordered                                    Transition of care consulted                  Consults called:   emailed cardiology    Admission status:  ED Disposition    ED Disposition Condition Comment   Admit  [facility]ospital Area: [facility] [[id number]] Level of Care: Telemetry [5] Admit to tele based on following criteria: Monitor for Ischemic changes Covid Evaluation: covid negative Diagnosis: Hypertensive urgency [960454] Admitting Physician : Therisa Doyne [[id number]] Attending Physician: Therisa Doyne [[id number]]       Obs    Level of care    tele  For   24H    Lab Results  Component Value Date   SARSCOV2NAA NEGATIVE 08/19/2020     Precautions: admitted as Covid Negative    PPE: Used by the provider:   P100  eye Goggles,   Gloves    Hezzie Karim 08/20/2020, 12:14 AM    [facility]     after 2 AM please page floor coverage PA If 7AM-7PM, please contact the day team taking care of the patient using [email or web address]   Patient was evaluated in the context of the global COVID-19 pandemic, which necessitated consideration that the patient might be at risk for infection with the SARS-CoV-2 virus that causes COVID-19. Institutional protocols  and algorithms that pertain to the evaluation of patients at risk for COVID-19 are in a state of rapid change based on information released by regulatory bodies including the CDC and federal and state organizations. These policies and algorithms were followed during the patient's care.

## 2020-08-19 NOTE — ED Notes (Signed)
Snack given to pt with diet coke. Pt has friend at bedside

## 2020-08-19 NOTE — ED Notes (Signed)
Patient does not have her glasses for visual acuity screening. Vision in R eye was 20/30, L eye was 20/40.

## 2020-08-19 NOTE — ED Notes (Signed)
Pt walked to bathroom with steady gate.

## 2020-08-19 NOTE — Progress Notes (Addendum)
Transition of Care [facility]) - Emergency Department Mini Assessment   Patient Details  Name: Darlene Guzman MRN: [id number] Date of Birth: [birth year]  Transition of Care [facility] CM/SW Contact:    Elliot Cousin, RN Phone Number: [phone] 08/19/2020, 4:05 PM   Clinical Narrative: TOC CM spoke to pt and states he just got a full-time job at night but does not have anywhere to stay. She is currently homeless. States she does have a PCP at [facility] and able to get her meds except for insulin needles, glucometer and test strips. Contacted [facility] and reports they may have a bed tomorrow. Will provide pt with information. Provided pt with information on emergency shelters. Pt was given for $20 to assist with gas to get to shelter on tomorrow.      ED Mini Assessment: What brought you to the Emergency Department? : diabetes  Barriers to Discharge: Continued Medical Work up     Means of departure: Car  Interventions which prevented an admission or readmission: Homeless Screening    Patient Contact and Communications  Admission diagnosis:  Diabetic Issue with SOB and Partially Collapsed Lung  There are no problems to display for this patient.  PCP:  Pcp, No Pharmacy:   [id number] - [place], [place] - [address] AT [facility] OF [address] [place] [address] Phone: [phone] Fax: [phone]

## 2020-08-19 NOTE — ED Triage Notes (Signed)
Pt POV-  Pt reports living out of car and not having access to diabetes supplies. Pt reports having blurred vision, shaking since yesterday. Pt reports not having anything to eat today.  C/o polydipsia, polyuria.

## 2020-08-19 NOTE — ED Notes (Signed)
Pt states she became dizzy when she went from laying to sitting and then sitting to standing

## 2020-08-20 ENCOUNTER — Encounter (HOSPITAL_COMMUNITY)
Admission: EM | Disposition: A | Discharge status: Home / Self Care | Payer: Self-pay | Source: Home / Self Care | Attending: Internal Medicine

## 2020-08-20 ENCOUNTER — Encounter (HOSPITAL_COMMUNITY): Payer: Self-pay | Admitting: Internal Medicine

## 2020-08-20 ENCOUNTER — Observation Stay (HOSPITAL_COMMUNITY): Payer: Managed Care, Other (non HMO)

## 2020-08-20 DIAGNOSIS — I200 Unstable angina: Principal | ICD-10-CM | POA: Diagnosis present

## 2020-08-20 DIAGNOSIS — I10 Essential (primary) hypertension: Secondary | ICD-10-CM | POA: Diagnosis present

## 2020-08-20 DIAGNOSIS — E785 Hyperlipidemia, unspecified: Secondary | ICD-10-CM

## 2020-08-20 DIAGNOSIS — F32A Depression, unspecified: Secondary | ICD-10-CM

## 2020-08-20 DIAGNOSIS — I5032 Chronic diastolic (congestive) heart failure: Secondary | ICD-10-CM | POA: Insufficient documentation

## 2020-08-20 HISTORY — PX: LEFT HEART CATH AND CORONARY ANGIOGRAPHY: CATH118249

## 2020-08-20 LAB — CBC WITH DIFFERENTIAL/PLATELET
Abs Immature Granulocytes: 0.05 10*3/uL (ref 0.00–0.07)
Abs Immature Granulocytes: 0.04 10*3/uL (ref 0.00–0.07)
Basophils Absolute: 0 10*3/uL (ref 0.0–0.1)
Basophils Absolute: 0 10*3/uL (ref 0.0–0.1)
Basophils Relative: 0 %
Basophils Relative: 1 %
Eosinophils Absolute: 0.1 10*3/uL (ref 0.0–0.5)
Eosinophils Absolute: 0.1 10*3/uL (ref 0.0–0.5)
Eosinophils Relative: 1 %
Eosinophils Relative: 2 %
HCT: 39.7 % (ref 36.0–46.0)
HCT: 39 % (ref 36.0–46.0)
Hemoglobin: 12.8 g/dL (ref 12.0–15.0)
Hemoglobin: 12.5 g/dL (ref 12.0–15.0)
Immature Granulocytes: 1 %
Immature Granulocytes: 1 %
Lymphocytes Relative: 31 %
Lymphocytes Relative: 30 %
Lymphs Abs: 3 10*3/uL (ref 0.7–4.0)
Lymphs Abs: 2 10*3/uL (ref 0.7–4.0)
MCH: 27.4 pg (ref 26.0–34.0)
MCH: 27 pg (ref 26.0–34.0)
MCHC: 32.2 g/dL (ref 30.0–36.0)
MCHC: 32.1 g/dL (ref 30.0–36.0)
MCV: 85 fL (ref 80.0–100.0)
MCV: 84.2 fL (ref 80.0–100.0)
Monocytes Absolute: 0.9 10*3/uL (ref 0.1–1.0)
Monocytes Absolute: 0.6 10*3/uL (ref 0.1–1.0)
Monocytes Relative: 9 %
Monocytes Relative: 9 %
Neutro Abs: 5.6 10*3/uL (ref 1.7–7.7)
Neutro Abs: 3.8 10*3/uL (ref 1.7–7.7)
Neutrophils Relative %: 58 %
Neutrophils Relative %: 57 %
Platelets: 278 10*3/uL (ref 150–400)
Platelets: 213 10*3/uL (ref 150–400)
RBC: 4.67 MIL/uL (ref 3.87–5.11)
RBC: 4.63 MIL/uL (ref 3.87–5.11)
RDW: 14.8 % (ref 11.5–15.5)
RDW: 14.7 % (ref 11.5–15.5)
WBC: 9.8 10*3/uL (ref 4.0–10.5)
WBC: 6.6 10*3/uL (ref 4.0–10.5)
nRBC: 0 % (ref 0.0–0.2)
nRBC: 0 % (ref 0.0–0.2)

## 2020-08-20 LAB — GLUCOSE, CAPILLARY
Glucose-Capillary: 166 mg/dL — ABNORMAL HIGH (ref 70–99)
Glucose-Capillary: 170 mg/dL — ABNORMAL HIGH (ref 70–99)
Glucose-Capillary: 207 mg/dL — ABNORMAL HIGH (ref 70–99)

## 2020-08-20 LAB — LIPID PANEL
Cholesterol: 206 mg/dL — ABNORMAL HIGH (ref 0–200)
HDL: 37 mg/dL — ABNORMAL LOW (ref >40)
LDL Cholesterol: 105 mg/dL — ABNORMAL HIGH (ref 0–99)
Total CHOL/HDL Ratio: 5.6 RATIO
Triglycerides: 321 mg/dL — ABNORMAL HIGH (ref <150)
VLDL: 64 mg/dL — ABNORMAL HIGH (ref 0–40)

## 2020-08-20 LAB — ECHOCARDIOGRAM COMPLETE
Area-P 1/2: 5.75 cm2
Calc EF: 43.6 %
S' Lateral: 2.5 cm
Single Plane A2C EF: 47.4 %
Single Plane A4C EF: 38 %

## 2020-08-20 LAB — COMPREHENSIVE METABOLIC PANEL
ALT: 21 U/L (ref 0–44)
AST: 19 U/L (ref 15–41)
Albumin: 3.8 g/dL (ref 3.5–5.0)
Alkaline Phosphatase: 55 U/L (ref 38–126)
Anion gap: 10 (ref 5–15)
BUN: 13 mg/dL (ref 6–20)
CO2: 22 mmol/L (ref 22–32)
Calcium: 9.3 mg/dL (ref 8.9–10.3)
Chloride: 101 mmol/L (ref 98–111)
Creatinine, Ser: 0.71 mg/dL (ref 0.44–1.00)
GFR, Estimated: >60 mL/min (ref >60)
Glucose, Bld: 214 mg/dL — ABNORMAL HIGH (ref 70–99)
Potassium: 3.6 mmol/L (ref 3.5–5.1)
Sodium: 133 mmol/L — ABNORMAL LOW (ref 135–145)
Total Bilirubin: 0.5 mg/dL (ref 0.3–1.2)
Total Protein: 6.8 g/dL (ref 6.5–8.1)

## 2020-08-20 LAB — HIV ANTIBODY (ROUTINE TESTING W REFLEX): HIV Screen 4th Generation wRfx: NONREACTIVE

## 2020-08-20 LAB — URINE CULTURE: Culture: <10000 — AB

## 2020-08-20 LAB — MAGNESIUM: Magnesium: 1.8 mg/dL (ref 1.7–2.4)

## 2020-08-20 LAB — TROPONIN I (HIGH SENSITIVITY)
Troponin I (High Sensitivity): 144 ng/L (ref <18)
Troponin I (High Sensitivity): 150 ng/L (ref <18)
Troponin I (High Sensitivity): 162 ng/L (ref <18)

## 2020-08-20 LAB — PHOSPHORUS: Phosphorus: 3.1 mg/dL (ref 2.5–4.6)

## 2020-08-20 LAB — CBG MONITORING, ED
Glucose-Capillary: 183 mg/dL — ABNORMAL HIGH (ref 70–99)
Glucose-Capillary: 200 mg/dL — ABNORMAL HIGH (ref 70–99)
Glucose-Capillary: 263 mg/dL — ABNORMAL HIGH (ref 70–99)

## 2020-08-20 LAB — TSH: TSH: 2.567 u[IU]/mL (ref 0.350–4.500)

## 2020-08-20 LAB — PROTIME-INR
INR: 1 (ref 0.8–1.2)
Prothrombin Time: 12.6 seconds (ref 11.4–15.2)

## 2020-08-20 LAB — APTT: aPTT: 28 seconds (ref 24–36)

## 2020-08-20 SURGERY — LEFT HEART CATH AND CORONARY ANGIOGRAPHY
Anesthesia: LOCAL

## 2020-08-20 MED ORDER — CLOPIDOGREL BISULFATE 75 MG PO TABS
75.0000 mg | ORAL_TABLET | Freq: Every day | ORAL | Status: DC
  Administered 2020-08-21 – 2020-08-22 (×2): 75 mg via ORAL
  Filled 2020-08-20 (×2): qty 1

## 2020-08-20 MED ORDER — INSULIN ASPART 100 UNIT/ML ~~LOC~~ SOLN
0.0000 [IU] | Freq: Every day | SUBCUTANEOUS | Status: DC
  Administered 2020-08-21: 2 [IU] via SUBCUTANEOUS

## 2020-08-20 MED ORDER — DIPHENHYDRAMINE HCL 50 MG/ML IJ SOLN: 12.5000 mg | Freq: Once | INTRAMUSCULAR | Status: DC

## 2020-08-20 MED ORDER — LIVING WELL WITH DIABETES BOOK
Freq: Once | Status: AC
  Filled 2020-08-20 (×2): qty 1

## 2020-08-20 MED ORDER — SODIUM CHLORIDE 0.9% FLUSH: 3.0000 mL | INTRAVENOUS | Status: DC | PRN

## 2020-08-20 MED ORDER — SODIUM CHLORIDE 0.9% FLUSH
3.0000 mL | Freq: Two times a day (BID) | INTRAVENOUS | Status: DC
  Administered 2020-08-21 – 2020-08-22 (×2): 3 mL via INTRAVENOUS

## 2020-08-20 MED ORDER — ALBUTEROL SULFATE HFA 108 (90 BASE) MCG/ACT IN AERS
2.0000 | INHALATION_SPRAY | Freq: Four times a day (QID) | RESPIRATORY_TRACT | Status: DC | PRN
  Filled 2020-08-20: qty 6.7

## 2020-08-20 MED ORDER — INSULIN ASPART 100 UNIT/ML ~~LOC~~ SOLN
0.0000 [IU] | Freq: Three times a day (TID) | SUBCUTANEOUS | Status: DC
  Administered 2020-08-21 (×3): 2 [IU] via SUBCUTANEOUS
  Administered 2020-08-22: 5 [IU] via SUBCUTANEOUS
  Administered 2020-08-22: 1 [IU] via SUBCUTANEOUS

## 2020-08-20 MED ORDER — LABETALOL HCL 5 MG/ML IV SOLN
10.0000 mg | INTRAVENOUS | Status: DC | PRN
  Filled 2020-08-20: qty 4

## 2020-08-20 MED ORDER — HEPARIN (PORCINE) IN NACL 1000-0.9 UT/500ML-% IV SOLN
INTRAVENOUS | Status: DC | PRN
  Administered 2020-08-20: 500 mL

## 2020-08-20 MED ORDER — SODIUM CHLORIDE 0.9 % IV SOLN: 250.0000 mL | INTRAVENOUS | Status: DC | PRN

## 2020-08-20 MED ORDER — HYDRALAZINE HCL 20 MG/ML IJ SOLN: 10.0000 mg | INTRAMUSCULAR | Status: DC | PRN

## 2020-08-20 MED ORDER — AMLODIPINE BESYLATE 5 MG PO TABS
5.0000 mg | ORAL_TABLET | Freq: Every day | ORAL | Status: DC
  Administered 2020-08-20 – 2020-08-22 (×3): 5 mg via ORAL
  Filled 2020-08-20 (×3): qty 1

## 2020-08-20 MED ORDER — DIPHENHYDRAMINE HCL 50 MG/ML IJ SOLN
INTRAMUSCULAR | Status: AC
  Filled 2020-08-20: qty 1

## 2020-08-20 MED ORDER — LABETALOL HCL 5 MG/ML IV SOLN: 10.0000 mg | INTRAVENOUS | Status: DC | PRN

## 2020-08-20 MED ORDER — CHLOROPROCAINE HCL 1 % IJ SOLN
20.0000 mL | Freq: Once | INTRAMUSCULAR | Status: DC
  Filled 2020-08-20: qty 30

## 2020-08-20 MED ORDER — CHLOROPROCAINE HCL 1 % IJ SOLN
INTRAMUSCULAR | Status: DC | PRN
  Administered 2020-08-20: 15 mL

## 2020-08-20 MED ORDER — SODIUM CHLORIDE 0.9 % IV SOLN: INTRAVENOUS | Status: DC

## 2020-08-20 MED ORDER — IOHEXOL 350 MG/ML SOLN
INTRAVENOUS | Status: DC | PRN
  Administered 2020-08-20: 60 mL

## 2020-08-20 MED ORDER — FENTANYL CITRATE (PF) 100 MCG/2ML IJ SOLN
INTRAMUSCULAR | Status: DC | PRN
  Administered 2020-08-20: 25 ug via INTRAVENOUS

## 2020-08-20 MED ORDER — SODIUM CHLORIDE 0.9 % IV SOLN: INTRAVENOUS | Status: AC

## 2020-08-20 MED ORDER — HYDRALAZINE HCL 20 MG/ML IJ SOLN: 10.0000 mg | INTRAMUSCULAR | Status: AC | PRN

## 2020-08-20 MED ORDER — HYDRALAZINE HCL 25 MG PO TABS
25.0000 mg | ORAL_TABLET | Freq: Four times a day (QID) | ORAL | Status: DC
  Administered 2020-08-20 – 2020-08-21 (×4): 25 mg via ORAL
  Filled 2020-08-20 (×5): qty 1

## 2020-08-20 MED ORDER — ONDANSETRON HCL 4 MG/2ML IJ SOLN: 4.0000 mg | Freq: Four times a day (QID) | INTRAMUSCULAR | Status: DC | PRN

## 2020-08-20 MED ORDER — HEPARIN (PORCINE) IN NACL 1000-0.9 UT/500ML-% IV SOLN
INTRAVENOUS | Status: AC
  Filled 2020-08-20: qty 1000

## 2020-08-20 MED ORDER — ATORVASTATIN CALCIUM 80 MG PO TABS
80.0000 mg | ORAL_TABLET | Freq: Every day | ORAL | Status: DC
  Administered 2020-08-20 – 2020-08-21 (×2): 80 mg via ORAL
  Filled 2020-08-20 (×3): qty 1
  Filled 2020-08-20: qty 2

## 2020-08-20 MED ORDER — MIDAZOLAM HCL 2 MG/2ML IJ SOLN
INTRAMUSCULAR | Status: AC
  Filled 2020-08-20: qty 2

## 2020-08-20 MED ORDER — PERFLUTREN LIPID MICROSPHERE
1.0000 mL | INTRAVENOUS | Status: AC | PRN
  Administered 2020-08-20: 2 mL via INTRAVENOUS
  Filled 2020-08-20: qty 10

## 2020-08-20 MED ORDER — HEPARIN BOLUS VIA INFUSION
4000.0000 [IU] | Freq: Once | INTRAVENOUS | Status: AC
  Administered 2020-08-20: 4000 [IU] via INTRAVENOUS
  Filled 2020-08-20: qty 4000

## 2020-08-20 MED ORDER — MIDAZOLAM HCL 2 MG/2ML IJ SOLN
INTRAMUSCULAR | Status: DC | PRN
  Administered 2020-08-20: 1 mg via INTRAVENOUS

## 2020-08-20 MED ORDER — HEPARIN (PORCINE) 25000 UT/250ML-% IV SOLN
900.0000 [IU]/h | INTRAVENOUS | Status: DC
  Administered 2020-08-20: 900 [IU]/h via INTRAVENOUS
  Filled 2020-08-20: qty 250

## 2020-08-20 MED ORDER — FENTANYL CITRATE (PF) 100 MCG/2ML IJ SOLN
INTRAMUSCULAR | Status: AC
  Filled 2020-08-20: qty 2

## 2020-08-20 MED ORDER — SODIUM CHLORIDE 0.9 % IV SOLN
75.0000 mL/h | INTRAVENOUS | Status: AC
  Administered 2020-08-20: 75 mL/h via INTRAVENOUS

## 2020-08-20 MED ORDER — SODIUM CHLORIDE 0.9% FLUSH
3.0000 mL | Freq: Two times a day (BID) | INTRAVENOUS | Status: DC
  Administered 2020-08-20: 3 mL via INTRAVENOUS

## 2020-08-20 MED ORDER — DIPHENHYDRAMINE HCL 50 MG/ML IJ SOLN
INTRAMUSCULAR | Status: DC | PRN
  Administered 2020-08-20: 12.5 mg via INTRAVENOUS

## 2020-08-20 MED ORDER — GABAPENTIN 100 MG PO CAPS
200.0000 mg | ORAL_CAPSULE | Freq: Once | ORAL | Status: AC
  Administered 2020-08-20: 200 mg via ORAL
  Filled 2020-08-20: qty 2

## 2020-08-20 SURGICAL SUPPLY — 8 items
CATH INFINITI 5FR MULTPACK ANG (CATHETERS) ×2 IMPLANT
KIT HEART LEFT (KITS) ×2 IMPLANT
NEEDLE SMART 18GX3.5CM LONG (NEEDLE) ×2 IMPLANT
PACK CARDIAC CATHETERIZATION (CUSTOM PROCEDURE TRAY) ×2 IMPLANT
SHEATH PINNACLE 5F 10CM (SHEATH) ×2 IMPLANT
SYR MEDRAD MARK 7 150ML (SYRINGE) ×2 IMPLANT
TRANSDUCER W/STOPCOCK (MISCELLANEOUS) ×2 IMPLANT
WIRE EMERALD 3MM-J .035X150CM (WIRE) ×2 IMPLANT

## 2020-08-20 NOTE — Assessment & Plan Note (Signed)
-   on lisinopril and lasix - continue at discharge

## 2020-08-20 NOTE — Consult Note (Signed)
Referring Physician:  Crystallynn Noorani is an 44 y.o. female.                       Chief Complaint: Chest pain and abnormal troponin I  HPI: 44 years old white female with PMH of type 2 DM with hyperglycemia, obesity, hypertension and depression has chest pain with shortness of breath lasting for 30 minutes. Her troponin I are mildly elevated and trending up. EKG is sinus rhythm with LBBB and right atrial enlargement. Echocardiogram showed mild anterior wall hypokinesia with 40-45 % EF. Currently she is chest pain free.  Past Medical History:  Diagnosis Date  . Asthma   . Depression   . Diabetes (HCC)   . Diabetes mellitus without complication (HCC)   . Obesity       Past Surgical History:  Procedure Laterality Date  . BREAST SURGERY      Family History  Problem Relation Age of Onset  . CAD Father   . Diabetes Father   . Hypertension Father    Social History:  reports that she has never smoked. Her smokeless tobacco use includes chew. She reports that she does not drink alcohol and does not use drugs.  Allergies:  Allergies  Allergen Reactions  . Azithromycin Nausea And Vomiting  . 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 Celexa [Citalopram]   . Lidocaine   . Morphine And Related Hives  . Raspberry Other (See Comments)    Childhood allergy reaction unknown  . Novocain [Procaine] Rash    (Not in a hospital admission)   Results for orders placed or performed during the hospital encounter of 08/19/20 (from the past 48 hour(s))  CBG monitoring, ED     Status: Abnormal   Collection Time: 08/19/20  2:47 PM  Result Value Ref Range   Glucose-Capillary 384 (H) 70 - 99 mg/dL    Comment: Glucose reference range applies only to samples taken after fasting for at least 8 hours.  Urinalysis, Routine w reflex microscopic Urine, Clean Catch     Status: Abnormal   Collection Time: 08/19/20  3:06 PM  Result Value Ref  Range   Color, Urine YELLOW YELLOW   APPearance HAZY (A) CLEAR   Specific Gravity, Urine 1.029 1.005 - 1.030   pH 6.0 5.0 - 8.0   Glucose, UA >=500 (A) NEGATIVE mg/dL   Hgb urine dipstick NEGATIVE NEGATIVE   Bilirubin Urine NEGATIVE NEGATIVE   Ketones, ur NEGATIVE NEGATIVE mg/dL   Protein, ur NEGATIVE NEGATIVE mg/dL   Nitrite NEGATIVE NEGATIVE   Leukocytes,Ua NEGATIVE NEGATIVE   RBC / HPF 0-5 0 - 5 RBC/hpf   WBC, UA 6-10 0 - 5 WBC/hpf   Bacteria, UA MANY (A) NONE SEEN   Squamous Epithelial / LPF 0-5 0 - 5    Comment: Performed at [facility], [address], [place], [place] [address]  Basic metabolic panel     Status: Abnormal   Collection Time: 08/19/20  3:09 PM  Result Value Ref Range   Sodium 132 (L) 135 - 145 mmol/L   Potassium 4.1 3.5 - 5.1 mmol/L   Chloride 99 98 - 111 mmol/L   CO2 22 22 - 32 mmol/L   Glucose, Bld 366 (H) 70 - 99 mg/dL    Comment: Glucose reference range applies only to samples taken after fasting for at least 8 hours.   BUN 13 6 -  20 mg/dL   Creatinine, Ser 1.61 0.44 - 1.00 mg/dL   Calcium 9.1 8.9 - 09.6 mg/dL   GFR, Estimated >04 >54 mL/min    Comment: (NOTE) Calculated using the CKD-EPI Creatinine Equation (2021)    Anion gap 11 5 - 15    Comment: Performed at [facility], [address], [place], [place] [address]  CBC     Status: None   Collection Time: 08/19/20  3:09 PM  Result Value Ref Range   WBC 8.2 4.0 - 10.5 K/uL   RBC 4.88 3.87 - 5.11 MIL/uL   Hemoglobin 13.5 12.0 - 15.0 g/dL   HCT 91.4 78.2 - 95.6 %   MCV 83.4 80.0 - 100.0 fL   MCH 27.7 26.0 - 34.0 pg   MCHC 33.2 30.0 - 36.0 g/dL   RDW 21.3 08.6 - 57.8 %   Platelets 272 150 - 400 K/uL   nRBC 0.0 0.0 - 0.2 %    Comment: Performed at [facility], [address] [place], [place] [address]  Brain natriuretic peptide     Status: None   Collection Time: 08/19/20  3:09 PM  Result Value Ref Range   B Natriuretic Peptide 51.5  0.0 - 100.0 pg/mL    Comment: Performed at [facility], [address], [place], [place] [address]  Magnesium     Status: None   Collection Time: 08/19/20  3:09 PM  Result Value Ref Range   Magnesium 1.9 1.7 - 2.4 mg/dL    Comment: Performed at [facility], [address], [place], [place] [address]  Rapid urine drug screen (hospital performed)     Status: None   Collection Time: 08/19/20  3:13 PM  Result Value Ref Range   Opiates NONE DETECTED NONE DETECTED   Cocaine NONE DETECTED NONE DETECTED   Benzodiazepines NONE DETECTED NONE DETECTED   Amphetamines NONE DETECTED NONE DETECTED   Tetrahydrocannabinol NONE DETECTED NONE DETECTED   Barbiturates NONE DETECTED NONE DETECTED    Comment: (NOTE) DRUG SCREEN FOR MEDICAL PURPOSES ONLY.  IF CONFIRMATION IS NEEDED FOR ANY PURPOSE, NOTIFY LAB WITHIN 5 DAYS.  LOWEST DETECTABLE LIMITS FOR URINE DRUG SCREEN Drug Class                     Cutoff (ng/mL) Amphetamine and metabolites    1000 Barbiturate and metabolites    200 Benzodiazepine                 200 Tricyclics and metabolites     300 Opiates and metabolites        300 Cocaine and metabolites        300 THC                            50 Performed at [facility], [address] [place], [place] [address]   I-Stat beta hCG blood, ED     Status: None   Collection Time: 08/19/20  3:21 PM  Result Value Ref Range   I-stat hCG, quantitative <5.0 <5 mIU/mL   Comment 3            Comment:   GEST. AGE      CONC.  (mIU/mL)   <=1 WEEK        5 - 50     2 WEEKS       50 - 500  3 WEEKS       100 - 10,000     4 WEEKS     1,000 - 30,000        FEMALE AND NON-PREGNANT FEMALE:     LESS THAN 5 mIU/mL   Troponin I (High Sensitivity)     Status: Abnormal   Collection Time: 08/19/20  3:30 PM  Result Value Ref Range   Troponin I (High Sensitivity) 122 (HH) <18 ng/L    Comment: CRITICAL RESULT CALLED TO, READ BACK BY AND VERIFIED  WITH: GARRISON,G. RN @1644  08/19/20 BILLINGSLEY,L (NOTE) Elevated high sensitivity troponin I (hsTnI) values and significant  changes across serial measurements may suggest ACS but many other  chronic and acute conditions are known to elevate hsTnI results.  Refer to the Links section for chest pain algorithms and additional  guidance. Performed at [facility], [address] [place], [place] [address]   Lipase, blood     Status: None   Collection Time: 08/19/20  3:30 PM  Result Value Ref Range   Lipase 33 11 - 51 U/L    Comment: Performed at [facility], [address] [place], [place] [address]  Hepatic function panel     Status: None   Collection Time: 08/19/20  3:30 PM  Result Value Ref Range   Total Protein 7.3 6.5 - 8.1 g/dL   Albumin 4.0 3.5 - 5.0 g/dL   AST 23 15 - 41 U/L   ALT 22 0 - 44 U/L   Alkaline Phosphatase 61 38 - 126 U/L   Total Bilirubin 0.6 0.3 - 1.2 mg/dL   Bilirubin, Direct 0.1 0.0 - 0.2 mg/dL   Indirect Bilirubin 0.5 0.3 - 0.9 mg/dL    Comment: Performed at [facility], [address] [place], [place] [address]  D-dimer, quantitative (not at [facility])     Status: None   Collection Time: 08/19/20  4:51 PM  Result Value Ref Range   D-Dimer, Quant 0.38 0.00 - 0.50 ug/mL-FEU    Comment: (NOTE) At the manufacturer cut-off value of 0.5 g/mL FEU, this assay has a negative predictive value of 95-100%.This assay is intended for use in conjunction with a clinical pretest probability (PTP) assessment model to exclude pulmonary embolism (PE) and deep venous thrombosis (DVT) in outpatients suspected of PE or DVT. Results should be correlated with clinical presentation. Performed at [facility], [address] [place], [place] [address]   Troponin I (High Sensitivity)     Status: Abnormal   Collection Time: 08/19/20  5:46 PM  Result Value Ref Range   Troponin I (High Sensitivity) 132  (HH) <18 ng/L    Comment: CRITICAL VALUE NOTED.  VALUE IS CONSISTENT WITH PREVIOUSLY REPORTED AND CALLED VALUE. (NOTE) Elevated high sensitivity troponin I (hsTnI) values and significant  changes across serial measurements may suggest ACS but many other  chronic and acute conditions are known to elevate hsTnI results.  Refer to the Links section for chest pain algorithms and additional  guidance. Performed at [facility], [address] [place], [place] [address]   CBG monitoring, ED     Status: Abnormal   Collection Time: 08/19/20  6:48 PM  Result Value Ref Range   Glucose-Capillary 196 (H) 70 - 99 mg/dL    Comment: Glucose reference range applies only to samples taken after fasting for at least 8 hours.  Resp Panel by RT-PCR (Flu A&B, Covid) Nasopharyngeal Swab  Status: None   Collection Time: 08/19/20  6:50 PM   Specimen: Nasopharyngeal Swab; Nasopharyngeal(NP) swabs in vial transport medium  Result Value Ref Range   SARS Coronavirus 2 by RT PCR NEGATIVE NEGATIVE    Comment: (NOTE) SARS-CoV-2 target nucleic acids are NOT DETECTED.  The SARS-CoV-2 RNA is generally detectable in upper respiratory specimens during the acute phase of infection. The lowest concentration of SARS-CoV-2 viral copies this assay can detect is 138 copies/mL. A negative result does not preclude SARS-Cov-2 infection and should not be used as the sole basis for treatment or other patient management decisions. A negative result may occur with  improper specimen collection/handling, submission of specimen other than nasopharyngeal swab, presence of viral mutation(s) within the areas targeted by this assay, and inadequate number of viral copies(<138 copies/mL). A negative result must be combined with clinical observations, patient history, and epidemiological information. The expected result is Negative.  Fact Sheet for Patients:  BloggerCourse.com  Fact Sheet  for Healthcare Providers:  SeriousBroker.it  This test is no t yet approved or cleared by the [place] FDA and  has been authorized for detection and/or diagnosis of SARS-CoV-2 by FDA under an Emergency Use Authorization (EUA). This EUA will remain  in effect (meaning this test can be used) for the duration of the COVID-19 declaration under Section 564(b)(1) of the Act, 21 U.S.C.section 360bbb-3(b)(1), unless the authorization is terminated  or revoked sooner.       Influenza A by PCR NEGATIVE NEGATIVE   Influenza B by PCR NEGATIVE NEGATIVE    Comment: (NOTE) The Xpert Xpress SARS-CoV-2/FLU/RSV plus assay is intended as an aid in the diagnosis of influenza from Nasopharyngeal swab specimens and should not be used as a sole basis for treatment. Nasal washings and aspirates are unacceptable for Xpert Xpress SARS-CoV-2/FLU/RSV testing.  Fact Sheet for Patients: BloggerCourse.com  Fact Sheet for Healthcare Providers: SeriousBroker.it  This test is not yet approved or cleared by the [place] FDA and has been authorized for detection and/or diagnosis of SARS-CoV-2 by FDA under an Emergency Use Authorization (EUA). This EUA will remain in effect (meaning this test can be used) for the duration of the COVID-19 declaration under Section 564(b)(1) of the Act, 21 U.S.C. section 360bbb-3(b)(1), unless the authorization is terminated or revoked.  Performed at [facility], [address], [place], [place] [address]   CBG monitoring, ED     Status: Abnormal   Collection Time: 08/19/20  7:54 PM  Result Value Ref Range   Glucose-Capillary 178 (H) 70 - 99 mg/dL    Comment: Glucose reference range applies only to samples taken after fasting for at least 8 hours.  Ethanol     Status: None   Collection Time: 08/19/20  7:54 PM  Result Value Ref Range   Alcohol, Ethyl (B) <10 <10 mg/dL     Comment: (NOTE) Lowest detectable limit for serum alcohol is 10 mg/dL.  For medical purposes only. Performed at [facility], [address], [place], [place] [address]   Troponin I (High Sensitivity)     Status: Abnormal   Collection Time: 08/19/20  7:54 PM  Result Value Ref Range   Troponin I (High Sensitivity) 144 (HH) <18 ng/L    Comment: CRITICAL RESULT CALLED TO, READ BACK BY AND VERIFIED WITH: PARKER C. 12.08.21 @ 2052 BY MECIAL J. (NOTE) Elevated high sensitivity troponin I (hsTnI) values and significant  changes across serial measurements may suggest ACS but many other  chronic and acute conditions are known to elevate hsTnI results.  Refer to the Links section for chest pain algorithms and additional  guidance. Performed at [facility], [address], [place], [place] [address]   Hemoglobin A1c     Status: Abnormal   Collection Time: 08/19/20  7:54 PM  Result Value Ref Range   Hgb A1c MFr Bld 8.8 (H) 4.8 - 5.6 %    Comment: (NOTE) Pre diabetes:          5.7%-6.4%  Diabetes:              >6.4%  Glycemic control for   <7.0% adults with diabetes    Mean Plasma Glucose 205.86 mg/dL    Comment: Performed at [facility], [address], [place], [place] [address]  Troponin I (High Sensitivity)     Status: Abnormal   Collection Time: 08/19/20  9:16 PM  Result Value Ref Range   Troponin I (High Sensitivity) 139 (HH) <18 ng/L    Comment: CRITICAL RESULT CALLED TO, READ BACK BY AND VERIFIED WITH: SMITH J. 12.8.21 @ 2253 BY MECIAL (NOTE) Elevated high sensitivity troponin I (hsTnI) values and significant  changes across serial measurements may suggest ACS but many other  chronic and acute conditions are known to elevate hsTnI results.  Refer to the Links section for chest pain algorithms and additional  guidance. Performed at [facility], [address] [place], [place] [address]   CBG monitoring, ED      Status: Abnormal   Collection Time: 08/20/20 12:10 AM  Result Value Ref Range   Glucose-Capillary 200 (H) 70 - 99 mg/dL    Comment: Glucose reference range applies only to samples taken after fasting for at least 8 hours.  Magnesium     Status: None   Collection Time: 08/20/20  3:18 AM  Result Value Ref Range   Magnesium 1.8 1.7 - 2.4 mg/dL    Comment: Performed at [facility], [address] [place], [place] [address]  Phosphorus     Status: None   Collection Time: 08/20/20  3:18 AM  Result Value Ref Range   Phosphorus 3.1 2.5 - 4.6 mg/dL    Comment: Performed at [facility], [address], [place], [place] [address]  Comprehensive metabolic panel     Status: Abnormal   Collection Time: 08/20/20  3:18 AM  Result Value Ref Range   Sodium 133 (L) 135 - 145 mmol/L   Potassium 3.6 3.5 - 5.1 mmol/L   Chloride 101 98 - 111 mmol/L   CO2 22 22 - 32 mmol/L   Glucose, Bld 214 (H) 70 - 99 mg/dL    Comment: Glucose reference range applies only to samples taken after fasting for at least 8 hours.   BUN 13 6 - 20 mg/dL   Creatinine, Ser 6.60 0.44 - 1.00 mg/dL   Calcium 9.3 8.9 - 63.0 mg/dL   Total Protein 6.8 6.5 - 8.1 g/dL   Albumin 3.8 3.5 - 5.0 g/dL   AST 19 15 - 41 U/L   ALT 21 0 - 44 U/L   Alkaline Phosphatase 55 38 - 126 U/L   Total Bilirubin 0.5 0.3 - 1.2 mg/dL   GFR, Estimated >16 >01 mL/min    Comment: (NOTE) Calculated using the CKD-EPI Creatinine Equation (2021)    Anion gap 10 5 - 15    Comment: Performed at [facility], [address] [place], [place] [address]  Troponin I (High Sensitivity)     Status: Abnormal   Collection Time: 08/20/20  3:18 AM  Result Value Ref Range   Troponin I (High Sensitivity) 144 (HH) <18 ng/L    Comment: CRITICAL RESULT CALLED TO, READ BACK BY AND VERIFIED WITH: PARKER C. 12.9.21 @ 0417 BY MECIAL J.  (NOTE) Elevated high sensitivity troponin I (hsTnI) values and significant   changes across serial measurements may suggest ACS but many other  chronic and acute conditions are known to elevate hsTnI results.  Refer to the Links section for chest pain algorithms and additional  guidance. Performed at [facility], [address] [place], [place] [address]   CBG monitoring, ED     Status: Abnormal   Collection Time: 08/20/20  4:23 AM  Result Value Ref Range   Glucose-Capillary 263 (H) 70 - 99 mg/dL    Comment: Glucose reference range applies only to samples taken after fasting for at least 8 hours.  TSH     Status: None   Collection Time: 08/20/20  4:36 AM  Result Value Ref Range   TSH 2.567 0.350 - 4.500 uIU/mL    Comment: Performed by a 3rd Generation assay with a functional sensitivity of <=0.01 uIU/mL. Performed at [facility], [address] [place], [place] [address]   Lipid panel     Status: Abnormal   Collection Time: 08/20/20  4:36 AM  Result Value Ref Range   Cholesterol 206 (H) 0 - 200 mg/dL   Triglycerides 295 (H) <150 mg/dL   HDL 37 (L) >28 mg/dL   Total CHOL/HDL Ratio 5.6 RATIO   VLDL 64 (H) 0 - 40 mg/dL   LDL Cholesterol 413 (H) 0 - 99 mg/dL    Comment:        Total Cholesterol/HDL:CHD Risk Coronary Heart Disease Risk Table                     Men   Women  1/2 Average Risk   3.4   3.3  Average Risk       5.0   4.4  2 X Average Risk   9.6   7.1  3 X Average Risk  23.4   11.0        Use the calculated Patient Ratio above and the CHD Risk Table to determine the patient's CHD Risk.        ATP III CLASSIFICATION (LDL):  <100     mg/dL   Optimal  244-010  mg/dL   Near or Above                    Optimal  130-159  mg/dL   Borderline  272-536  mg/dL   High  >644     mg/dL   Very High Performed at [facility], [address], [place], [place] [address]   CBC with Differential/Platelet     Status: None   Collection Time: 08/20/20  4:36 AM  Result Value Ref Range   WBC 9.8  4.0 - 10.5 K/uL   RBC 4.67 3.87 - 5.11 MIL/uL   Hemoglobin 12.8 12.0 - 15.0 g/dL   HCT 25.9 56.3 - 87.5 %   MCV 85.0 80.0 - 100.0 fL   MCH 27.4 26.0 - 34.0 pg   MCHC 32.2 30.0 - 36.0 g/dL   RDW 64.3 32.9 - 51.8 %   Platelets 278 150 - 400 K/uL  nRBC 0.0 0.0 - 0.2 %   Neutrophils Relative % 58 %   Neutro Abs 5.6 1.7 - 7.7 K/uL   Lymphocytes Relative 31 %   Lymphs Abs 3.0 0.7 - 4.0 K/uL   Monocytes Relative 9 %   Monocytes Absolute 0.9 0.1 - 1.0 K/uL   Eosinophils Relative 1 %   Eosinophils Absolute 0.1 0.0 - 0.5 K/uL   Basophils Relative 0 %   Basophils Absolute 0.0 0.0 - 0.1 K/uL   Immature Granulocytes 1 %   Abs Immature Granulocytes 0.05 0.00 - 0.07 K/uL    Comment: Performed at [facility], [address], [place], [place] [address]  Troponin I (High Sensitivity)     Status: Abnormal   Collection Time: 08/20/20  4:36 AM  Result Value Ref Range   Troponin I (High Sensitivity) 150 (HH) <18 ng/L    Comment: CRITICAL RESULT CALLED TO, READ BACK BY AND VERIFIED WITH: PARKER C. 12.9.21 @ 0532 BY MECIAL J. Performed at [facility], [address], [place], [place] [address]   CBG monitoring, ED     Status: Abnormal   Collection Time: 08/20/20  8:41 AM  Result Value Ref Range   Glucose-Capillary 183 (H) 70 - 99 mg/dL    Comment: Glucose reference range applies only to samples taken after fasting for at least 8 hours.  APTT     Status: None   Collection Time: 08/20/20  9:10 AM  Result Value Ref Range   aPTT 28 24 - 36 seconds    Comment: Performed at [facility], [address], [place], [place] [address]  Protime-INR     Status: None   Collection Time: 08/20/20  9:10 AM  Result Value Ref Range   Prothrombin Time 12.6 11.4 - 15.2 seconds   INR 1.0 0.8 - 1.2    Comment: (NOTE) INR goal varies based on device and disease states. Performed at [facility], [address] [place], [place]  [address]    DG Chest 2 View  Result Date: 08/19/2020 CLINICAL DATA:  Shortness of breath. EXAM: CHEST - 2 VIEW COMPARISON:  April 01, 2020 FINDINGS: The heart size and mediastinal contours are within normal limits. Both lungs are clear. The visualized skeletal structures are unremarkable. IMPRESSION: No active cardiopulmonary disease. Electronically Signed   By: Aram Candela M.D.   On: 08/19/2020 16:36   CT Head Wo Contrast  Result Date: 08/19/2020 CLINICAL DATA:  Diplopia. EXAM: CT HEAD WITHOUT CONTRAST TECHNIQUE: Contiguous axial images were obtained from the base of the skull through the vertex without intravenous contrast. COMPARISON:  June 02, 2007 FINDINGS: Brain: No evidence of acute infarction, hemorrhage, hydrocephalus, extra-axial collection or mass lesion/mass effect. Vascular: No hyperdense vessel or unexpected calcification. Skull: Normal. Negative for fracture or focal lesion. Sinuses/Orbits: No acute finding. Other: None. IMPRESSION: No acute intracranial pathology. Electronically Signed   By: Aram Candela M.D.   On: 08/19/2020 16:35   ECHOCARDIOGRAM COMPLETE  Result Date: 08/20/2020    ECHOCARDIOGRAM REPORT   Patient Name:   Darlene Guzman Date of Exam: 08/20/2020 Medical Rec #:  [id number]       Height:       60.0 in Accession #:    [id number]      Weight:       230.0 lb Date of Birth:  [birth year]       BSA:          1.981 m Patient  Age:    44 years        BP:           149/96 mmHg Patient Gender: F               HR:           80 bpm. Exam Location:  Inpatient Procedure: 2D Echo, Cardiac Doppler, Color Doppler and Intracardiac            Opacification Agent Indications:     R07.9* Chest pain, unspecified. Elevated troponin.  History:         Patient has no prior history of Echocardiogram examinations.                  Risk Factors:Diabetes.  Sonographer:     Sheralyn Boatman RDCS Referring Phys:  Kenn File DOUTOVA Diagnosing Phys: Orpah Cobb MD IMPRESSIONS  1. Left  ventricular ejection fraction, by estimation, is 45 to 50%. The left ventricle has mildly decreased function. The left ventricle demonstrates regional wall motion abnormalities (see scoring diagram/findings for description). There is mild concentric left ventricular hypertrophy. Left ventricular diastolic parameters are consistent with Grade I diastolic dysfunction (impaired relaxation). There is moderate hypokinesis of the left ventricular, basal-mid anterior wall. There is mild paradoxical of the left ventricular, entire septal wall.  2. Right ventricular systolic function is low normal. The right ventricular size is normal.  3. Left atrial size was mildly dilated.  4. The mitral valve is normal in structure. Trivial mitral valve regurgitation.  5. The aortic valve is tricuspid. Aortic valve regurgitation is not visualized. Mild aortic valve sclerosis is present, with no evidence of aortic valve stenosis.  6. There is mild (Grade II) atheroma plaque involving the aortic root.  7. The inferior vena cava is normal in size with <50% respiratory variability, suggesting right atrial pressure of 8 mmHg. FINDINGS  Left Ventricle: Left ventricular ejection fraction, by estimation, is 45 to 50%. The left ventricle has mildly decreased function. The left ventricle demonstrates regional wall motion abnormalities. Moderate hypokinesis of the left ventricular, basal-mid anterior wall. Mild paradoxical of the left ventricular, entire septal wall. Definity contrast agent was given IV to delineate the left ventricular endocardial borders. The left ventricular internal cavity size was normal in size. There is mild  concentric left ventricular hypertrophy. Left ventricular diastolic parameters are consistent with Grade I diastolic dysfunction (impaired relaxation). Right Ventricle: The right ventricular size is normal. No increase in right ventricular wall thickness. Right ventricular systolic function is low normal. Left Atrium:  Left atrial size was mildly dilated. Right Atrium: Right atrial size was normal in size. Pericardium: There is no evidence of pericardial effusion. Mitral Valve: The mitral valve is normal in structure. There is moderate calcification of the anterior mitral valve leaflet(s). Mild to moderate mitral annular calcification. Trivial mitral valve regurgitation. Tricuspid Valve: The tricuspid valve is normal in structure. Tricuspid valve regurgitation is trivial. Aortic Valve: The aortic valve is tricuspid. Aortic valve regurgitation is not visualized. Mild aortic valve sclerosis is present, with no evidence of aortic valve stenosis. Pulmonic Valve: The pulmonic valve was normal in structure. Pulmonic valve regurgitation is not visualized. Aorta: The aortic root is normal in size and structure. There is mild (Grade II) atheroma plaque involving the aortic root. Venous: The inferior vena cava is normal in size with less than 50% respiratory variability, suggesting right atrial pressure of 8 mmHg. IAS/Shunts: The interatrial septum was not assessed.  LEFT VENTRICLE PLAX 2D  LVIDd:         4.10 cm      Diastology LVIDs:         2.50 cm      LV e' medial:    5.11 cm/s LV PW:         1.40 cm      LV E/e' medial:  21.0 LV IVS:        1.30 cm      LV e' lateral:   9.46 cm/s LVOT diam:     1.90 cm      LV E/e' lateral: 11.4 LV SV:         63 LV SV Index:   32 LVOT Area:     2.84 cm  LV Volumes (MOD) LV vol d, MOD A2C: 137.0 ml LV vol d, MOD A4C: 114.0 ml LV vol s, MOD A2C: 72.0 ml LV vol s, MOD A4C: 70.7 ml LV SV MOD A2C:     65.0 ml LV SV MOD A4C:     114.0 ml LV SV MOD BP:      54.7 ml RIGHT VENTRICLE RV S prime:     14.80 cm/s TAPSE (M-mode): 2.5 cm LEFT ATRIUM             Index       RIGHT ATRIUM           Index LA diam:        3.70 cm 1.87 cm/m  RA Area:     10.10 cm LA Vol (A2C):   30.2 ml 15.25 ml/m RA Volume:   22.70 ml  11.46 ml/m LA Vol (A4C):   54.8 ml 27.67 ml/m LA Biplane Vol: 43.4 ml 21.91 ml/m  AORTIC VALVE  LVOT Vmax:   107.00 cm/s LVOT Vmean:  80.600 cm/s LVOT VTI:    0.221 m  AORTA Ao Root diam: 3.30 cm Ao Asc diam:  2.90 cm MITRAL VALVE MV Area (PHT): 5.75 cm     SHUNTS MV Decel Time: 132 msec     Systemic VTI:  0.22 m MV E velocity: 107.50 cm/s  Systemic Diam: 1.90 cm MV A velocity: 114.00 cm/s MV E/A ratio:  0.94 Orpah Cobb MD Electronically signed by Orpah Cobb MD Signature Date/Time: 08/20/2020/9:41:11 AM    Final     Review Of Systems Constitutional: No fever, chills, chronic weight gain. Eyes: No vision change, wears glasses. No discharge or pain. Ears: No hearing loss, No tinnitus. Respiratory: No asthma, COPD, pneumonias. Positive shortness of breath. No hemoptysis. Cardiovascular: Positive chest pain, palpitation, leg edema. Gastrointestinal: No nausea, vomiting, diarrhea, constipation. No GI bleed. No hepatitis. Genitourinary: No dysuria, hematuria, kidney stone. No incontinance. Neurological: No headache, stroke, seizures.  Psychiatry: No psych facility admission for anxiety, depression, suicide. No detox. Skin: No rash. Musculoskeletal: No joint pain, fibromyalgia. No neck pain, back pain. Lymphadenopathy: No lymphadenopathy. Hematology: No anemia or easy bruising.   Blood pressure (!) 135/110, pulse 81, temperature 98 F (36.7 C), temperature source Oral, resp. rate (!) 26, last menstrual period 07/29/2020, SpO2 98 %. There is no height or weight on file to calculate BMI. General appearance: alert, cooperative, appears stated age and no distress Head: Normocephalic, atraumatic. Eyes: Hazel eyes, pink conjunctiva, corneas clear. PERRL, EOM's intact. Neck: No adenopathy, no carotid bruit, no JVD, supple, symmetrical, trachea midline and thyroid not enlarged. Resp: Clear to auscultation bilaterally. Cardio: Regular rate and rhythm, S1, S2 normal, II/VI systolic murmur, no click, rub or gallop GI: Soft,  non-tender; bowel sounds normal; no organomegaly. Extremities: Trace  edema, cyanosis or clubbing. 1 + radial and femoral pulses. Skin: Warm and dry.  Neurologic: Alert and oriented X 3, normal strength. Normal coordination.  Assessment/Plan Acute coronary syndrome Type 2 DM with hyperglycemia Hypertension Obesity  Plan:  IV heparin. NPO.  Discussed cardiac cath, procedure, risks and alternatives. Patient agrees to the procedure.  Time spent: Review of old records, Lab, x-rays, EKG, other cardiac tests, examination, discussion with patient over 70 minutes.  Ricki Rodriguez, MD  08/20/2020, 9:46 AM

## 2020-08-20 NOTE — ED Notes (Signed)
Pt in bed watching pt in bed watching tv, call light with in reach tv, call light with in reach

## 2020-08-20 NOTE — ED Notes (Signed)
pt in bed watching tv, call light with in reach

## 2020-08-20 NOTE — Hospital Course (Signed)
Darlene Guzman is a 44 y.o. female with PMH DMII, HTN (uncontrolled), depression, financial constraints/homelessness who presented to the hospital with CP and pain/bubbling sensation in her stomach which was radiating up to her shoulders.  She states she typically has that sensation but it has been becoming more frequent and lasting longer over the past several weeks to months. She has difficulty managing her blood pressure and states it usually runs high.  She lives out of her car and does follow with a primary physician in [place]. On admission she also endorsed some blurry vision and symptoms of polydipsia/polyuria. She denied any diaphoresis or nausea/vomiting. She did endorse chewing tobacco (does not smoke cigarettes), denied alcohol or illicit drug use.  EKG revealed chronic left bundle branch block.  Troponins were checked and initial troponin was 122 which continued to trend upward. She was started on a heparin drip and cardiology was consulted.  She underwent heart catheterization on 08/20/2020.  There was no large stenosis but notable for microvascular disease.  She was recommended to continue on aspirin and Plavix for 6 months and ongoing lifestyle modification.

## 2020-08-20 NOTE — Progress Notes (Signed)
Site area: rt groin fa sheath Site Prior to Removal:  Level 0 Pressure Applied For: 25 minutes Manual:   yes Patient Status During Pull:  stable Post Pull Site:  Level 0 Post Pull Instructions Given:  yes Post Pull Pulses Present: rt dp 1+ Dressing Applied:  Gauze and tegaderm Bedrest begins @ 1410 Comments:

## 2020-08-20 NOTE — Progress Notes (Signed)
Patient transferred from cath lab at 1435hrs. Oriented to unit and plan of care for shift.  Right femoral site level zero.  Given post cath instructions.

## 2020-08-20 NOTE — Assessment & Plan Note (Signed)
-   see [place]

## 2020-08-20 NOTE — Assessment & Plan Note (Signed)
-   lifestyle changes have been recommended

## 2020-08-20 NOTE — Progress Notes (Signed)
PT Cancellation Note  Patient Details Name: Darlene Guzman MRN: [id number] DOB: 1975-10-10   Cancelled Treatment:    Reason Eval/Treat Not Completed: Medical issues which prohibited therapy , noted to have cardiac cath and BP is high. Will check back post procedure.  Rada Hay 08/20/2020, 10:09 AM Blanchard Kelch PT Acute Rehabilitation Services Pager [phone] Office [phone]

## 2020-08-20 NOTE — Progress Notes (Signed)
Inpatient Diabetes Program Recommendations  AACE/ADA: New Consensus Statement on Inpatient Glycemic Control (2015)  Target Ranges:  Prepandial:   less than 140 mg/dL      Peak postprandial:   less than 180 mg/dL (1-2 hours)      Critically ill patients:  140 - 180 mg/dL   Lab Results  Component Value Date   GLUCAP 183 (H) 08/20/2020   HGBA1C 8.8 (H) 08/19/2020    Review of Glycemic Control  Diabetes history: DM2 Outpatient Diabetes medications: metformin 500 mg bid Current orders for Inpatient glycemic control: Novolog 0-9 units Q4H  Hgba1C - 8.8% CBGs: 263, 183 BMET: 214  Needs tighter glycemic control. Pt is Homeless. Has hard time checking blood sugars d/t homelessness.  Inpatient Diabetes Program Recommendations:     Increase Novolog to 0-15 units Q4H.  Will speak with pt regarding lifestyle modification with weight loss and physical activity. Stress importance of eating healthier diet and making effort to move body, walk, and eliminate high sugar foods and beverages. Make certain pt has meter and supplies to check blood sugars. F/U with PCP for diabetes management. Add affordable OHA to metformin for better control.   See pt 12/9 in ED.  Thank you. Ailene Ards, RD, LDN, CDE Inpatient Diabetes Coordinator [phone]

## 2020-08-20 NOTE — Assessment & Plan Note (Signed)
-   A1c 8.8% - on glipizide and Metformin at home, will need to continue at discharge and increase doses as able

## 2020-08-20 NOTE — Assessment & Plan Note (Signed)
Continue amitriptyline

## 2020-08-20 NOTE — ED Notes (Signed)
Pt in bed watching tv, call light with in reach

## 2020-08-20 NOTE — Assessment & Plan Note (Signed)
-  LDL 105 -Goal less than 70 -Start on Lipitor

## 2020-08-20 NOTE — Progress Notes (Signed)
ANTICOAGULATION CONSULT NOTE - Initial Consult  Pharmacy Consult for heparin infusion Indication: ACS/STEMI  Allergies  Allergen Reactions  . Azithromycin Nausea And Vomiting  . 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 Celexa [Citalopram]   . Lidocaine   . Morphine And Related Hives  . Raspberry Other (See Comments)    Childhood allergy reaction unknown  . Novocain [Procaine] Rash    Patient Measurements:   Heparin Dosing Weight: 75 kg estimated using previously documented weight of 117.9 kg and height 5 ft on 07/22/20  Vital Signs: Temp: 98 F (36.7 C) (12/09 0055) Temp Source: Oral (12/09 0055) BP: 149/96 (12/09 0800) Pulse Rate: 78 (12/09 0800)  Labs: Recent Labs    08/19/20 1509 08/19/20 1530 08/19/20 2116 08/20/20 0318 08/20/20 0436  HGB 13.5  --   --   --  12.8  HCT 40.7  --   --   --  39.7  PLT 272  --   --   --  278  CREATININE 0.86  --   --  0.71  --   TROPONINIHS  --    < > 139* 144* 150*   < > = values in this interval not displayed.    CrCl cannot be calculated (Unknown ideal weight.).   Medical History: Past Medical History:  Diagnosis Date  . Asthma   . Depression   . Diabetes (HCC)   . Diabetes mellitus without complication (HCC)   . Obesity     Medications:  No PTA anticoagulation documented  Assessment: Pharmacy consulted to dose heparin infusion for ACS/STEMI for this 43 y.o. female with medical history significant of homelessness, DM2, HTN, depression.  Admitted for chest pain,elevated troponin and hypertension with hyperglycemia.  Goal of Therapy:  Heparin level 0.3-0.7 units/ml Monitor platelets by anticoagulation protocol: Yes   Plan:  Discontinue enoxaparin (none administered) Obtain baseline aPTT, PT/INR then Give 4000 units heparin bolus x 1 Start heparin infusion at 900 units/hr Check anti-Xa level in 6 hours Monitor daily heparin level, CBC, s/s  bleeding  Heidy Mccubbin P. Casimiro Needle, PharmD, BCPS Clinical Pharmacist [place] Please utilize Amion for appropriate phone number to reach the unit pharmacist [facility]) 08/20/2020 8:54 AM

## 2020-08-20 NOTE — Progress Notes (Signed)
PROGRESS NOTE    Darlene Guzman   GNF:[id number]  DOB: 02-16-1976  DOA: 08/19/2020     0  PCP: Eyvonne Mechanic, FNP  CC: [facility] Course: Ms. Crotteau is a 44 y.o. female with PMH DMII, HTN (uncontrolled), depression, financial constraints/homelessness who presented to the hospital with CP and pain/bubbling sensation in her stomach which was radiating up to her shoulders.  She states she typically has that sensation but it has been becoming more frequent and lasting longer over the past several weeks to months. She has difficulty managing her blood pressure and states it usually runs high.  She lives out of her car and does follow with a primary physician in [place]. On admission she also endorsed some blurry vision and symptoms of polydipsia/polyuria. She denied any diaphoresis or nausea/vomiting. She did endorse chewing tobacco (does not smoke cigarettes), denied alcohol or illicit drug use.  EKG revealed chronic left bundle branch block.  Troponins were checked and initial troponin was 122 which continued to trend upward. She was started on a heparin drip and cardiology was consulted.  She underwent heart catheterization on 08/20/2020.  There was no large stenosis but notable for microvascular disease.  She was recommended to continue on aspirin and Plavix for 6 months and ongoing lifestyle modification.   Interval History:  Patient seen this morning in the ER.  She appears to be having what sounds like unstable angina.  Cardiology was consulted and she was placed on a heparin drip.  After being seen, she was taken immediately to the Cath Lab.  Microvascular disease found as she was placed on aspirin and Plavix.  Old records reviewed in assessment of this patient  ROS: Constitutional: negative for chills and fevers, Respiratory: negative for cough, Cardiovascular: negative for chest pain and Gastrointestinal: negative for abdominal pain  Assessment & Plan: *  Unstable angina (HCC) -Progressively worsening abdominal symptoms; likely angina equivalent given female -Troponin elevated on admission with uptrending.  EKG shows chronic left bundle branch block -Cardiology was consulted and patient is now s/p heart cath on 12/9: No large stenosis, shows microvascular disease -Continue aspirin and Plavix for 6 months per cardiology recommendations -Continue ongoing risk factor control and lifestyle changes -Patient will need medication assistance at time of discharge  Chronic diastolic CHF (congestive heart failure) (HCC) - EF 45-50%, Gr 1 DD. Mild LVH and moderate hypokinesis of LV, basal-mid anterior wall -Continue aspirin, lisinopril, Lipitor  Depression -Continue amitriptyline  Morbid obesity with BMI of 50.0-59.9, adult (HCC) - lifestyle changes have been recommended  HLD (hyperlipidemia) -LDL 105 -Goal less than 70 -Start on Lipitor  Essential hypertension - on lisinopril and lasix - continue at discharge   DM (diabetes mellitus), type 2 with complications (HCC) - A1c 8.8% - on glipizide and Metformin at home, will need to continue at discharge and increase doses as able   Chest pain-resolved as of 08/20/2020 - see [place]  Hypertensive urgency-resolved as of 08/20/2020 -Possible noncompliance given financial constraints -Use home meds and as needed labetalol or hydralazine for now   Antimicrobials: n/a  DVT prophylaxis: Heparin drip Code Status: Full Family Communication: none present Disposition Plan: Status is: Inpatient  Remains inpatient appropriate because:Ongoing diagnostic testing needed not appropriate for outpatient work up, IV treatments appropriate due to intensity of illness or inability to take PO and Inpatient level of care appropriate due to severity of illness   Dispo: The patient is from: Home  Anticipated d/c is to: Home              Anticipated d/c date is: 1 day              Patient currently  is not medically stable to d/c.       Objective: Blood pressure 137/62, pulse 65, temperature 98.2 F (36.8 C), temperature source Oral, resp. rate 16, height 5' (1.524 m), weight 117.5 kg, last menstrual period 07/29/2020, SpO2 100 %.  Examination: General appearance: pleasant, morbidly obese woman sitting up in bed in NAD Head: Normocephalic, without obvious abnormality, atraumatic Eyes: EOMI Lungs: clear to auscultation bilaterally Heart: regular rate and rhythm and S1, S2 normal Abdomen: obese, soft, NT, BS distant and present Extremities: 1-2+ LE edema Skin: mobility and turgor normal Neurologic: Grossly normal  Consultants:   Cardiology  Procedures:   08/20/20: Heart cath  Data Reviewed: I have personally reviewed following labs and imaging studies Results for orders placed or performed during the hospital encounter of 08/19/20 (from the past 24 hour(s))  Urinalysis, Routine w reflex microscopic Urine, Clean Catch     Status: Abnormal   Collection Time: 08/19/20  3:06 PM  Result Value Ref Range   Color, Urine YELLOW YELLOW   APPearance HAZY (A) CLEAR   Specific Gravity, Urine 1.029 1.005 - 1.030   pH 6.0 5.0 - 8.0   Glucose, UA >=500 (A) NEGATIVE mg/dL   Hgb urine dipstick NEGATIVE NEGATIVE   Bilirubin Urine NEGATIVE NEGATIVE   Ketones, ur NEGATIVE NEGATIVE mg/dL   Protein, ur NEGATIVE NEGATIVE mg/dL   Nitrite NEGATIVE NEGATIVE   Leukocytes,Ua NEGATIVE NEGATIVE   RBC / HPF 0-5 0 - 5 RBC/hpf   WBC, UA 6-10 0 - 5 WBC/hpf   Bacteria, UA MANY (A) NONE SEEN   Squamous Epithelial / LPF 0-5 0 - 5  Urine culture     Status: Abnormal   Collection Time: 08/19/20  3:06 PM   Specimen: Urine, Random  Result Value Ref Range   Specimen Description      URINE, RANDOM Performed at [facility], [address] [place], [place] [address]    Special Requests      NONE Performed at [facility], [address], [place], [place]  [address]    Culture (A)     <10,000 COLONIES/mL INSIGNIFICANT GROWTH Performed at [facility], [address], [place], [place] [address]    Report Status 08/20/2020 FINAL   Basic metabolic panel     Status: Abnormal   Collection Time: 08/19/20  3:09 PM  Result Value Ref Range   Sodium 132 (L) 135 - 145 mmol/L   Potassium 4.1 3.5 - 5.1 mmol/L   Chloride 99 98 - 111 mmol/L   CO2 22 22 - 32 mmol/L   Glucose, Bld 366 (H) 70 - 99 mg/dL   BUN 13 6 - 20 mg/dL   Creatinine, Ser 6.55 0.44 - 1.00 mg/dL   Calcium 9.1 8.9 - 37.4 mg/dL   GFR, Estimated >82 >70 mL/min   Anion gap 11 5 - 15  CBC     Status: None   Collection Time: 08/19/20  3:09 PM  Result Value Ref Range   WBC 8.2 4.0 - 10.5 K/uL   RBC 4.88 3.87 - 5.11 MIL/uL   Hemoglobin 13.5 12.0 - 15.0 g/dL   HCT 78.6 75.4 - 49.2 %   MCV 83.4 80.0 - 100.0 fL   MCH 27.7 26.0 -  34.0 pg   MCHC 33.2 30.0 - 36.0 g/dL   RDW 16.114.7 09.611.5 - 04.515.5 %   Platelets 272 150 - 400 K/uL   nRBC 0.0 0.0 - 0.2 %  Brain natriuretic peptide     Status: None   Collection Time: 08/19/20  3:09 PM  Result Value Ref Range   B Natriuretic Peptide 51.5 0.0 - 100.0 pg/mL  Magnesium     Status: None   Collection Time: 08/19/20  3:09 PM  Result Value Ref Range   Magnesium 1.9 1.7 - 2.4 mg/dL  Rapid urine drug screen (hospital performed)     Status: None   Collection Time: 08/19/20  3:13 PM  Result Value Ref Range   Opiates NONE DETECTED NONE DETECTED   Cocaine NONE DETECTED NONE DETECTED   Benzodiazepines NONE DETECTED NONE DETECTED   Amphetamines NONE DETECTED NONE DETECTED   Tetrahydrocannabinol NONE DETECTED NONE DETECTED   Barbiturates NONE DETECTED NONE DETECTED  I-Stat beta hCG blood, ED     Status: None   Collection Time: 08/19/20  3:21 PM  Result Value Ref Range   I-stat hCG, quantitative <5.0 <5 mIU/mL   Comment 3          Troponin I (High Sensitivity)     Status: Abnormal   Collection Time: 08/19/20  3:30 PM  Result Value Ref Range    Troponin I (High Sensitivity) 122 (HH) <18 ng/L  Lipase, blood     Status: None   Collection Time: 08/19/20  3:30 PM  Result Value Ref Range   Lipase 33 11 - 51 U/L  Hepatic function panel     Status: None   Collection Time: 08/19/20  3:30 PM  Result Value Ref Range   Total Protein 7.3 6.5 - 8.1 g/dL   Albumin 4.0 3.5 - 5.0 g/dL   AST 23 15 - 41 U/L   ALT 22 0 - 44 U/L   Alkaline Phosphatase 61 38 - 126 U/L   Total Bilirubin 0.6 0.3 - 1.2 mg/dL   Bilirubin, Direct 0.1 0.0 - 0.2 mg/dL   Indirect Bilirubin 0.5 0.3 - 0.9 mg/dL  D-dimer, quantitative (not at [facility])     Status: None   Collection Time: 08/19/20  4:51 PM  Result Value Ref Range   D-Dimer, Quant 0.38 0.00 - 0.50 ug/mL-FEU  Troponin I (High Sensitivity)     Status: Abnormal   Collection Time: 08/19/20  5:46 PM  Result Value Ref Range   Troponin I (High Sensitivity) 132 (HH) <18 ng/L  CBG monitoring, ED     Status: Abnormal   Collection Time: 08/19/20  6:48 PM  Result Value Ref Range   Glucose-Capillary 196 (H) 70 - 99 mg/dL  Resp Panel by RT-PCR (Flu A&B, Covid) Nasopharyngeal Swab     Status: None   Collection Time: 08/19/20  6:50 PM   Specimen: Nasopharyngeal Swab; Nasopharyngeal(NP) swabs in vial transport medium  Result Value Ref Range   SARS Coronavirus 2 by RT PCR NEGATIVE NEGATIVE   Influenza A by PCR NEGATIVE NEGATIVE   Influenza B by PCR NEGATIVE NEGATIVE  CBG monitoring, ED     Status: Abnormal   Collection Time: 08/19/20  7:54 PM  Result Value Ref Range   Glucose-Capillary 178 (H) 70 - 99 mg/dL  Ethanol     Status: None   Collection Time: 08/19/20  7:54 PM  Result Value Ref Range   Alcohol, Ethyl (B) <10 <10 mg/dL  Troponin I (High Sensitivity)  Status: Abnormal   Collection Time: 08/19/20  7:54 PM  Result Value Ref Range   Troponin I (High Sensitivity) 144 (HH) <18 ng/L  Hemoglobin A1c     Status: Abnormal   Collection Time: 08/19/20  7:54 PM  Result Value Ref Range   Hgb A1c MFr Bld 8.8 (H) 4.8  - 5.6 %   Mean Plasma Glucose 205.86 mg/dL  Troponin I (High Sensitivity)     Status: Abnormal   Collection Time: 08/19/20  9:16 PM  Result Value Ref Range   Troponin I (High Sensitivity) 139 (HH) <18 ng/L  CBG monitoring, ED     Status: Abnormal   Collection Time: 08/20/20 12:10 AM  Result Value Ref Range   Glucose-Capillary 200 (H) 70 - 99 mg/dL  HIV Antibody (routine testing w rflx)     Status: None   Collection Time: 08/20/20  3:18 AM  Result Value Ref Range   HIV Screen 4th Generation wRfx Non Reactive Non Reactive  Magnesium     Status: None   Collection Time: 08/20/20  3:18 AM  Result Value Ref Range   Magnesium 1.8 1.7 - 2.4 mg/dL  Phosphorus     Status: None   Collection Time: 08/20/20  3:18 AM  Result Value Ref Range   Phosphorus 3.1 2.5 - 4.6 mg/dL  Comprehensive metabolic panel     Status: Abnormal   Collection Time: 08/20/20  3:18 AM  Result Value Ref Range   Sodium 133 (L) 135 - 145 mmol/L   Potassium 3.6 3.5 - 5.1 mmol/L   Chloride 101 98 - 111 mmol/L   CO2 22 22 - 32 mmol/L   Glucose, Bld 214 (H) 70 - 99 mg/dL   BUN 13 6 - 20 mg/dL   Creatinine, Ser 9.73 0.44 - 1.00 mg/dL   Calcium 9.3 8.9 - 53.2 mg/dL   Total Protein 6.8 6.5 - 8.1 g/dL   Albumin 3.8 3.5 - 5.0 g/dL   AST 19 15 - 41 U/L   ALT 21 0 - 44 U/L   Alkaline Phosphatase 55 38 - 126 U/L   Total Bilirubin 0.5 0.3 - 1.2 mg/dL   GFR, Estimated >99 >24 mL/min   Anion gap 10 5 - 15  Troponin I (High Sensitivity)     Status: Abnormal   Collection Time: 08/20/20  3:18 AM  Result Value Ref Range   Troponin I (High Sensitivity) 144 (HH) <18 ng/L  CBG monitoring, ED     Status: Abnormal   Collection Time: 08/20/20  4:23 AM  Result Value Ref Range   Glucose-Capillary 263 (H) 70 - 99 mg/dL  TSH     Status: None   Collection Time: 08/20/20  4:36 AM  Result Value Ref Range   TSH 2.567 0.350 - 4.500 uIU/mL  Lipid panel     Status: Abnormal   Collection Time: 08/20/20  4:36 AM  Result Value Ref Range    Cholesterol 206 (H) 0 - 200 mg/dL   Triglycerides 268 (H) <150 mg/dL   HDL 37 (L) >34 mg/dL   Total CHOL/HDL Ratio 5.6 RATIO   VLDL 64 (H) 0 - 40 mg/dL   LDL Cholesterol 196 (H) 0 - 99 mg/dL  CBC with Differential/Platelet     Status: None   Collection Time: 08/20/20  4:36 AM  Result Value Ref Range   WBC 9.8 4.0 - 10.5 K/uL   RBC 4.67 3.87 - 5.11 MIL/uL   Hemoglobin 12.8 12.0 - 15.0 g/dL  HCT 39.7 36.0 - 46.0 %   MCV 85.0 80.0 - 100.0 fL   MCH 27.4 26.0 - 34.0 pg   MCHC 32.2 30.0 - 36.0 g/dL   RDW 16.1 09.6 - 04.5 %   Platelets 278 150 - 400 K/uL   nRBC 0.0 0.0 - 0.2 %   Neutrophils Relative % 58 %   Neutro Abs 5.6 1.7 - 7.7 K/uL   Lymphocytes Relative 31 %   Lymphs Abs 3.0 0.7 - 4.0 K/uL   Monocytes Relative 9 %   Monocytes Absolute 0.9 0.1 - 1.0 K/uL   Eosinophils Relative 1 %   Eosinophils Absolute 0.1 0.0 - 0.5 K/uL   Basophils Relative 0 %   Basophils Absolute 0.0 0.0 - 0.1 K/uL   Immature Granulocytes 1 %   Abs Immature Granulocytes 0.05 0.00 - 0.07 K/uL  Troponin I (High Sensitivity)     Status: Abnormal   Collection Time: 08/20/20  4:36 AM  Result Value Ref Range   Troponin I (High Sensitivity) 150 (HH) <18 ng/L  CBG monitoring, ED     Status: Abnormal   Collection Time: 08/20/20  8:41 AM  Result Value Ref Range   Glucose-Capillary 183 (H) 70 - 99 mg/dL  APTT     Status: None   Collection Time: 08/20/20  9:10 AM  Result Value Ref Range   aPTT 28 24 - 36 seconds  Protime-INR     Status: None   Collection Time: 08/20/20  9:10 AM  Result Value Ref Range   Prothrombin Time 12.6 11.4 - 15.2 seconds   INR 1.0 0.8 - 1.2  Troponin I (High Sensitivity)     Status: Abnormal   Collection Time: 08/20/20  9:10 AM  Result Value Ref Range   Troponin I (High Sensitivity) 162 (HH) <18 ng/L  Glucose, capillary     Status: Abnormal   Collection Time: 08/20/20  1:42 PM  Result Value Ref Range   Glucose-Capillary 166 (H) 70 - 99 mg/dL    Recent Results (from the past 240  hour(s))  Urine culture     Status: Abnormal   Collection Time: 08/19/20  3:06 PM   Specimen: Urine, Random  Result Value Ref Range Status   Specimen Description   Final    URINE, RANDOM Performed at [facility], [address], [place], [place] [address]    Special Requests   Final    NONE Performed at [facility], [address], [place], [place] [address]    Culture (A)  Final    <10,000 COLONIES/mL INSIGNIFICANT GROWTH Performed at [facility], [address], [place], [place] [address]    Report Status 08/20/2020 FINAL  Final  Resp Panel by RT-PCR (Flu A&B, Covid) Nasopharyngeal Swab     Status: None   Collection Time: 08/19/20  6:50 PM   Specimen: Nasopharyngeal Swab; Nasopharyngeal(NP) swabs in vial transport medium  Result Value Ref Range Status   SARS Coronavirus 2 by RT PCR NEGATIVE NEGATIVE Final    Comment: (NOTE) SARS-CoV-2 target nucleic acids are NOT DETECTED.  The SARS-CoV-2 RNA is generally detectable in upper respiratory specimens during the acute phase of infection. The lowest concentration of SARS-CoV-2 viral copies this assay can detect is 138 copies/mL. A negative result does not preclude SARS-Cov-2 infection and should not be used as the sole basis for treatment or other patient management decisions. A negative result may occur with  improper specimen collection/handling, submission of specimen  other than nasopharyngeal swab, presence of viral mutation(s) within the areas targeted by this assay, and inadequate number of viral copies(<138 copies/mL). A negative result must be combined with clinical observations, patient history, and epidemiological information. The expected result is Negative.  Fact Sheet for Patients:  BloggerCourse.com  Fact Sheet for Healthcare Providers:  SeriousBroker.it  This test is no t yet approved or cleared by the [place]  FDA and  has been authorized for detection and/or diagnosis of SARS-CoV-2 by FDA under an Emergency Use Authorization (EUA). This EUA will remain  in effect (meaning this test can be used) for the duration of the COVID-19 declaration under Section 564(b)(1) of the Act, 21 U.S.C.section 360bbb-3(b)(1), unless the authorization is terminated  or revoked sooner.       Influenza A by PCR NEGATIVE NEGATIVE Final   Influenza B by PCR NEGATIVE NEGATIVE Final    Comment: (NOTE) The Xpert Xpress SARS-CoV-2/FLU/RSV plus assay is intended as an aid in the diagnosis of influenza from Nasopharyngeal swab specimens and should not be used as a sole basis for treatment. Nasal washings and aspirates are unacceptable for Xpert Xpress SARS-CoV-2/FLU/RSV testing.  Fact Sheet for Patients: BloggerCourse.com  Fact Sheet for Healthcare Providers: SeriousBroker.it  This test is not yet approved or cleared by the [place] FDA and has been authorized for detection and/or diagnosis of SARS-CoV-2 by FDA under an Emergency Use Authorization (EUA). This EUA will remain in effect (meaning this test can be used) for the duration of the COVID-19 declaration under Section 564(b)(1) of the Act, 21 U.S.C. section 360bbb-3(b)(1), unless the authorization is terminated or revoked.  Performed at [facility], [address], [place], [place] [address]      Radiology Studies: DG Chest 2 View  Result Date: 08/19/2020 CLINICAL DATA:  Shortness of breath. EXAM: CHEST - 2 VIEW COMPARISON:  April 01, 2020 FINDINGS: The heart size and mediastinal contours are within normal limits. Both lungs are clear. The visualized skeletal structures are unremarkable. IMPRESSION: No active cardiopulmonary disease. Electronically Signed   By: Aram Candela M.D.   On: 08/19/2020 16:36   CT Head Wo Contrast  Result Date: 08/19/2020 CLINICAL DATA:  Diplopia.  EXAM: CT HEAD WITHOUT CONTRAST TECHNIQUE: Contiguous axial images were obtained from the base of the skull through the vertex without intravenous contrast. COMPARISON:  June 02, 2007 FINDINGS: Brain: No evidence of acute infarction, hemorrhage, hydrocephalus, extra-axial collection or mass lesion/mass effect. Vascular: No hyperdense vessel or unexpected calcification. Skull: Normal. Negative for fracture or focal lesion. Sinuses/Orbits: No acute finding. Other: None. IMPRESSION: No acute intracranial pathology. Electronically Signed   By: Aram Candela M.D.   On: 08/19/2020 16:35   CARDIAC CATHETERIZATION  Result Date: 08/20/2020 Aspirin, Clopidogrel for 6 months for suspected microvascular disease. Life-style modification with heart healthy diet and activity. Improve diabetic control.   ECHOCARDIOGRAM COMPLETE  Result Date: 08/20/2020    ECHOCARDIOGRAM REPORT   Patient Name:   STEHANIE EKSTROM Date of Exam: 08/20/2020 Medical Rec #:  [id number]       Height:       60.0 in Accession #:    [id number]      Weight:       230.0 lb Date of Birth:  Sep 25, 1975       BSA:          1.981 m Patient Age:    44 years        BP:  149/96 mmHg Patient Gender: F               HR:           80 bpm. Exam Location:  Inpatient Procedure: 2D Echo, Cardiac Doppler, Color Doppler and Intracardiac            Opacification Agent Indications:     R07.9* Chest pain, unspecified. Elevated troponin.  History:         Patient has no prior history of Echocardiogram examinations.                  Risk Factors:Diabetes.  Sonographer:     Sheralyn Boatman RDCS Referring Phys:  Kenn File DOUTOVA Diagnosing Phys: Orpah Cobb MD IMPRESSIONS  1. Left ventricular ejection fraction, by estimation, is 45 to 50%. The left ventricle has mildly decreased function. The left ventricle demonstrates regional wall motion abnormalities (see scoring diagram/findings for description). There is mild concentric left ventricular hypertrophy. Left  ventricular diastolic parameters are consistent with Grade I diastolic dysfunction (impaired relaxation). There is moderate hypokinesis of the left ventricular, basal-mid anterior wall. There is mild paradoxical of the left ventricular, entire septal wall.  2. Right ventricular systolic function is low normal. The right ventricular size is normal.  3. Left atrial size was mildly dilated.  4. The mitral valve is normal in structure. Trivial mitral valve regurgitation.  5. The aortic valve is tricuspid. Aortic valve regurgitation is not visualized. Mild aortic valve sclerosis is present, with no evidence of aortic valve stenosis.  6. There is mild (Grade II) atheroma plaque involving the aortic root.  7. The inferior vena cava is normal in size with <50% respiratory variability, suggesting right atrial pressure of 8 mmHg. FINDINGS  Left Ventricle: Left ventricular ejection fraction, by estimation, is 45 to 50%. The left ventricle has mildly decreased function. The left ventricle demonstrates regional wall motion abnormalities. Moderate hypokinesis of the left ventricular, basal-mid anterior wall. Mild paradoxical of the left ventricular, entire septal wall. Definity contrast agent was given IV to delineate the left ventricular endocardial borders. The left ventricular internal cavity size was normal in size. There is mild  concentric left ventricular hypertrophy. Left ventricular diastolic parameters are consistent with Grade I diastolic dysfunction (impaired relaxation). Right Ventricle: The right ventricular size is normal. No increase in right ventricular wall thickness. Right ventricular systolic function is low normal. Left Atrium: Left atrial size was mildly dilated. Right Atrium: Right atrial size was normal in size. Pericardium: There is no evidence of pericardial effusion. Mitral Valve: The mitral valve is normal in structure. There is moderate calcification of the anterior mitral valve leaflet(s). Mild to  moderate mitral annular calcification. Trivial mitral valve regurgitation. Tricuspid Valve: The tricuspid valve is normal in structure. Tricuspid valve regurgitation is trivial. Aortic Valve: The aortic valve is tricuspid. Aortic valve regurgitation is not visualized. Mild aortic valve sclerosis is present, with no evidence of aortic valve stenosis. Pulmonic Valve: The pulmonic valve was normal in structure. Pulmonic valve regurgitation is not visualized. Aorta: The aortic root is normal in size and structure. There is mild (Grade II) atheroma plaque involving the aortic root. Venous: The inferior vena cava is normal in size with less than 50% respiratory variability, suggesting right atrial pressure of 8 mmHg. IAS/Shunts: The interatrial septum was not assessed.  LEFT VENTRICLE PLAX 2D LVIDd:         4.10 cm      Diastology LVIDs:  2.50 cm      LV e' medial:    5.11 cm/s LV PW:         1.40 cm      LV E/e' medial:  21.0 LV IVS:        1.30 cm      LV e' lateral:   9.46 cm/s LVOT diam:     1.90 cm      LV E/e' lateral: 11.4 LV SV:         63 LV SV Index:   32 LVOT Area:     2.84 cm  LV Volumes (MOD) LV vol d, MOD A2C: 137.0 ml LV vol d, MOD A4C: 114.0 ml LV vol s, MOD A2C: 72.0 ml LV vol s, MOD A4C: 70.7 ml LV SV MOD A2C:     65.0 ml LV SV MOD A4C:     114.0 ml LV SV MOD BP:      54.7 ml RIGHT VENTRICLE RV S prime:     14.80 cm/s TAPSE (M-mode): 2.5 cm LEFT ATRIUM             Index       RIGHT ATRIUM           Index LA diam:        3.70 cm 1.87 cm/m  RA Area:     10.10 cm LA Vol (A2C):   30.2 ml 15.25 ml/m RA Volume:   22.70 ml  11.46 ml/m LA Vol (A4C):   54.8 ml 27.67 ml/m LA Biplane Vol: 43.4 ml 21.91 ml/m  AORTIC VALVE LVOT Vmax:   107.00 cm/s LVOT Vmean:  80.600 cm/s LVOT VTI:    0.221 m  AORTA Ao Root diam: 3.30 cm Ao Asc diam:  2.90 cm MITRAL VALVE MV Area (PHT): 5.75 cm     SHUNTS MV Decel Time: 132 msec     Systemic VTI:  0.22 m MV E velocity: 107.50 cm/s  Systemic Diam: 1.90 cm MV A velocity:  114.00 cm/s MV E/A ratio:  0.94 Orpah Cobb MD Electronically signed by Orpah Cobb MD Signature Date/Time: 08/20/2020/9:41:11 AM    Final    DG Chest 2 View  Final Result    CT Head Wo Contrast  Final Result      Scheduled Meds: . amitriptyline  25 mg Oral QHS  . amLODipine  5 mg Oral Daily  . aspirin EC  81 mg Oral Daily  . atorvastatin  80 mg Oral Daily  . chloroprocaine  20 mL Infiltration Once  . [START ON 08/21/2020] clopidogrel  75 mg Oral Q breakfast  . diphenhydrAMINE  12.5 mg Intravenous Once  . hydrALAZINE  25 mg Oral Q6H  . insulin aspart  0-9 Units Subcutaneous Q4H  . lisinopril  40 mg Oral Daily  . living well with diabetes book   Does not apply Once  . nicotine  14 mg Transdermal Daily  . sodium chloride flush  3 mL Intravenous Q12H   PRN Meds: sodium chloride, acetaminophen **OR** acetaminophen, albuterol, hydrALAZINE, hydrALAZINE, labetalol, ondansetron (ZOFRAN) IV, sodium chloride flush Continuous Infusions: . sodium chloride 75 mL/hr (08/20/20 0943)  . sodium chloride    . sodium chloride       LOS: 0 days  Time spent: Greater than 50% of the 35 minute visit was spent in counseling/coordination of care for the patient as laid out in the A&P.   Lewie Chamber, MD [facility] 08/20/2020, 2:48 PM

## 2020-08-20 NOTE — Assessment & Plan Note (Signed)
-  Possible noncompliance given financial constraints -Use home meds and as needed labetalol or hydralazine for now

## 2020-08-20 NOTE — ED Notes (Signed)
Pt to restroom with steady gate

## 2020-08-20 NOTE — Progress Notes (Signed)
  Echocardiogram 2D Echocardiogram has been performed.  Janalyn Harder 08/20/2020, 8:53 AM

## 2020-08-20 NOTE — Assessment & Plan Note (Signed)
-  Progressively worsening abdominal symptoms; likely angina equivalent given female -Troponin elevated on admission with uptrending.  EKG shows chronic left bundle branch block -Cardiology was consulted and patient is now s/p heart cath on 12/9: No large stenosis, shows microvascular disease -Continue aspirin and Plavix for 6 months per cardiology recommendations -Continue ongoing risk factor control and lifestyle changes -Patient will need medication assistance at time of discharge

## 2020-08-20 NOTE — Progress Notes (Signed)
OT Cancellation Note  Patient Details Name: Darlene Guzman MRN: [id number] DOB: May 07, 1976   Cancelled Treatment:    Reason Eval/Treat Not Completed: Medical issues which prohibited therapy patient for cardiac cath and elevated BP. Will check back as schedule permits.  Marlyce Huge OT OT pager: [phone]   Carmelia Roller 08/20/2020, 10:30 AM

## 2020-08-20 NOTE — Assessment & Plan Note (Addendum)
-   EF 45-50%, Gr 1 DD. Mild LVH and moderate hypokinesis of LV, basal-mid anterior wall -Continue aspirin, lisinopril, Lipitor

## 2020-08-21 ENCOUNTER — Other Ambulatory Visit (HOSPITAL_COMMUNITY): Payer: Self-pay | Admitting: Internal Medicine

## 2020-08-21 ENCOUNTER — Encounter (HOSPITAL_COMMUNITY): Payer: Self-pay | Admitting: Cardiovascular Disease

## 2020-08-21 ENCOUNTER — Inpatient Hospital Stay (HOSPITAL_COMMUNITY): Payer: Managed Care, Other (non HMO)

## 2020-08-21 DIAGNOSIS — I5032 Chronic diastolic (congestive) heart failure: Secondary | ICD-10-CM

## 2020-08-21 DIAGNOSIS — Z6841 Body Mass Index (BMI) 40.0 and over, adult: Secondary | ICD-10-CM

## 2020-08-21 LAB — CBC
HCT: 35 % — ABNORMAL LOW (ref 36.0–46.0)
Hemoglobin: 11.2 g/dL — ABNORMAL LOW (ref 12.0–15.0)
MCH: 26.9 pg (ref 26.0–34.0)
MCHC: 32 g/dL (ref 30.0–36.0)
MCV: 83.9 fL (ref 80.0–100.0)
Platelets: 217 10*3/uL (ref 150–400)
RBC: 4.17 MIL/uL (ref 3.87–5.11)
RDW: 14.7 % (ref 11.5–15.5)
WBC: 7.8 10*3/uL (ref 4.0–10.5)
nRBC: 0 % (ref 0.0–0.2)

## 2020-08-21 LAB — BASIC METABOLIC PANEL
Anion gap: 10 (ref 5–15)
BUN: 12 mg/dL (ref 6–20)
CO2: 24 mmol/L (ref 22–32)
Calcium: 9.1 mg/dL (ref 8.9–10.3)
Chloride: 99 mmol/L (ref 98–111)
Creatinine, Ser: 0.78 mg/dL (ref 0.44–1.00)
GFR, Estimated: >60 mL/min (ref >60)
Glucose, Bld: 152 mg/dL — ABNORMAL HIGH (ref 70–99)
Potassium: 4.2 mmol/L (ref 3.5–5.1)
Sodium: 133 mmol/L — ABNORMAL LOW (ref 135–145)

## 2020-08-21 LAB — GLUCOSE, CAPILLARY
Glucose-Capillary: 170 mg/dL — ABNORMAL HIGH (ref 70–99)
Glucose-Capillary: 256 mg/dL — ABNORMAL HIGH (ref 70–99)
Glucose-Capillary: 200 mg/dL — ABNORMAL HIGH (ref 70–99)
Glucose-Capillary: 208 mg/dL — ABNORMAL HIGH (ref 70–99)

## 2020-08-21 LAB — TROPONIN I (HIGH SENSITIVITY): Troponin I (High Sensitivity): 75 ng/L — ABNORMAL HIGH (ref <18)

## 2020-08-21 MED ORDER — CLOPIDOGREL BISULFATE 75 MG PO TABS: 75.0000 mg | ORAL_TABLET | Freq: Every day | ORAL | 0 refills | Status: DC

## 2020-08-21 MED ORDER — SODIUM CHLORIDE 0.9 % IV SOLN: INTRAVENOUS | Status: DC

## 2020-08-21 MED ORDER — HYDROXYZINE HCL 25 MG PO TABS
25.0000 mg | ORAL_TABLET | Freq: Three times a day (TID) | ORAL | Status: DC | PRN
  Administered 2020-08-21: 25 mg via ORAL
  Filled 2020-08-21: qty 1

## 2020-08-21 MED ORDER — LISINOPRIL 40 MG PO TABS: 40.0000 mg | ORAL_TABLET | Freq: Every day | ORAL | 0 refills | Status: DC

## 2020-08-21 MED ORDER — ATORVASTATIN CALCIUM 80 MG PO TABS: 80.0000 mg | ORAL_TABLET | Freq: Every day | ORAL | 0 refills | Status: DC

## 2020-08-21 MED ORDER — NICOTINE 21 MG/24HR TD PT24
21.0000 mg | MEDICATED_PATCH | Freq: Every day | TRANSDERMAL | Status: DC
  Administered 2020-08-21 – 2020-08-22 (×2): 21 mg via TRANSDERMAL
  Filled 2020-08-21 (×2): qty 1

## 2020-08-21 MED ORDER — ASPIRIN 81 MG PO TBEC: 81.0000 mg | DELAYED_RELEASE_TABLET | Freq: Every day | ORAL | 0 refills | Status: DC

## 2020-08-21 MED ORDER — AMLODIPINE BESYLATE 5 MG PO TABS: 5.0000 mg | ORAL_TABLET | Freq: Every day | ORAL | 0 refills | Status: DC

## 2020-08-21 MED ORDER — METFORMIN HCL 500 MG PO TABS: 500.0000 mg | ORAL_TABLET | Freq: Two times a day (BID) | ORAL | 0 refills | Status: DC

## 2020-08-21 MED ORDER — NITROGLYCERIN 0.4 MG SL SUBL
0.4000 mg | SUBLINGUAL_TABLET | SUBLINGUAL | Status: AC | PRN
  Administered 2020-08-21 (×3): 0.4 mg via SUBLINGUAL

## 2020-08-21 MED ORDER — AMITRIPTYLINE HCL 25 MG PO TABS: 25.0000 mg | ORAL_TABLET | Freq: Every day | ORAL | 0 refills | Status: DC

## 2020-08-21 MED ORDER — ALBUTEROL SULFATE HFA 108 (90 BASE) MCG/ACT IN AERS: 2.0000 | INHALATION_SPRAY | Freq: Four times a day (QID) | RESPIRATORY_TRACT | 0 refills | Status: DC | PRN

## 2020-08-21 MED ORDER — IPRATROPIUM-ALBUTEROL 0.5-2.5 (3) MG/3ML IN SOLN: 3.0000 mL | Freq: Four times a day (QID) | RESPIRATORY_TRACT | Status: DC | PRN

## 2020-08-21 MED ORDER — NITROGLYCERIN 0.4 MG SL SUBL
SUBLINGUAL_TABLET | SUBLINGUAL | Status: AC
  Filled 2020-08-21: qty 1

## 2020-08-21 MED ORDER — HYDRALAZINE HCL 25 MG PO TABS: 25.0000 mg | ORAL_TABLET | Freq: Four times a day (QID) | ORAL | 0 refills | Status: DC

## 2020-08-21 MED ORDER — IPRATROPIUM-ALBUTEROL 0.5-2.5 (3) MG/3ML IN SOLN
3.0000 mL | Freq: Four times a day (QID) | RESPIRATORY_TRACT | Status: DC
  Filled 2020-08-21: qty 3

## 2020-08-21 MED ORDER — MECLIZINE HCL 25 MG PO TABS
25.0000 mg | ORAL_TABLET | Freq: Once | ORAL | Status: AC
  Administered 2020-08-21: 25 mg via ORAL
  Filled 2020-08-21: qty 1

## 2020-08-21 MED ORDER — MONTELUKAST SODIUM 10 MG PO TABS: 10.0000 mg | ORAL_TABLET | Freq: Every day | ORAL | 0 refills | Status: AC

## 2020-08-21 MED ORDER — ENOXAPARIN SODIUM 40 MG/0.4ML ~~LOC~~ SOLN
40.0000 mg | SUBCUTANEOUS | Status: DC
  Administered 2020-08-21: 40 mg via SUBCUTANEOUS
  Filled 2020-08-21: qty 0.4

## 2020-08-21 MED ORDER — BLOOD GLUCOSE METER KIT: PACK | 0 refills | Status: AC

## 2020-08-21 MED FILL — metFORMIN HCL 500 MG TABS: 500 | 30 days supply | Qty: 60 | Fill #0

## 2020-08-21 MED FILL — LISINOPRIL 40 MG TABS: 40 | 30 days supply | Qty: 30 | Fill #0

## 2020-08-21 MED FILL — ATORVASTATIN CALCIUM 80 MG: 80 | 30 days supply | Qty: 30 | Fill #0

## 2020-08-21 MED FILL — CLOPIDOGREL 75 MG TABLET: 75 | 30 days supply | Qty: 30 | Fill #0

## 2020-08-21 MED FILL — ALBUTEROL SULFATE HFA 108 (: 108 (90 BAS | 25 days supply | Qty: 18 | Fill #0

## 2020-08-21 MED FILL — AMITRIPTYLINE HCL 25 MG TAB: 25 | 30 days supply | Qty: 30 | Fill #0

## 2020-08-21 MED FILL — AMLODIPINE BESYLATE 5 MG TA: 5 | 30 days supply | Qty: 30 | Fill #0

## 2020-08-21 MED FILL — hydrALAZINE HCL 25 MG TABS: 25 | 30 days supply | Qty: 120 | Fill #0

## 2020-08-21 MED FILL — ASPIRIN LOW DOSE 81 MG TBEC: 81 | 30 days supply | Qty: 30 | Fill #0

## 2020-08-21 NOTE — Progress Notes (Signed)
Spoke to patient and friend and they stated that patient had stopped cymbalta on Wednesday a week or two ago and started having symptoms on Sunday. Information past on to [facility] RN and will let physician know in am.

## 2020-08-21 NOTE — Progress Notes (Signed)
PT Cancellation Note  Patient Details Name: Drusilla Wampole MRN: [id number] DOB: 1975/11/01   Cancelled Treatment:    Reason Eval/Treat Not Completed: Fatigue/lethargy limiting ability to participate.  Will retry at another time.   Ivar Drape 08/21/2020, 12:06 PM   Samul Dada, PT MS Acute Rehab Dept. Number: [facility] [id number] and [facility] [phone]

## 2020-08-21 NOTE — Progress Notes (Signed)
Nutrition Education Consult  Received consult for diet education regarding obesity, cardiac disease, and diabetes. Attempted to call patient with no answer. Per PT note, patient was too lethargic to participate in activity earlier today. RD attached "Heart Healthy Consistent Carbohydrate Nutrition Therapy" handout from the Academy of [employer] to discharge instructions.  Patient would benefit from outpatient diet education. No further nutrition needs at this time. Please re-consult if nutrition concerns arise.  Gabriel Rainwater, RD, LDN, CNSC Please refer to [facility] for contact information.

## 2020-08-21 NOTE — Discharge Instructions (Signed)
Heart-Healthy Consistent Carbohydrate Nutrition Therapy  A heart-healthy and consistent carbohydrate diet is recommended to manage heart disease and diabetes. To follow a heart-healthy and consistent carbohydrate diet,  Eat a balanced diet with whole grains, fruits and vegetables, and lean protein sources.  Choose heart-healthy unsaturated fats. Limit saturated fats, trans fats, and cholesterol intake. Eat more plant-based or vegetarian meals using beans and soy foods for protein.  Eat whole, unprocessed foods to limit the amount of sodium (salt) you eat.  Choose a consistent amount of carbohydrate at each meal and snack. Limit refined carbohydrates especially sugar, sweets and sugar-sweetened beverages.  If you drink alcohol, do so in moderation: one serving per day (women) and two servings per day (men). o One serving is equivalent to 12 ounces beer, 5 ounces wine, or 1.5 ounces distilled spirits   Tips Tips for Choosing Heart-Healthy Fats Choose lean protein and low-fat dairy foods to reduce saturated fat intake.  Saturated fat is usually found in animal-based protein and is associated with certain health risks. Saturated fat is the biggest contributor to raise low-density lipoprotein (LDL) cholesterol levels. Research shows that limiting saturated fat lowers unhealthy cholesterol levels. Eat no more than 7% of your total calories each day from saturated fat. Ask your RDN to help you determine how much saturated fat is right for you.  There are many foods that do not contain large amounts of saturated fats. Swapping these foods to replace foods high in saturated fats will help you limit the saturated fat you eat and improve your cholesterol levels. You can also try eating more plant-based or vegetarian meals.  Instead of. Try:  Whole milk, cheese, yogurt, and ice cream 1% or skim milk, low-fat cheese, non-fat yogurt, and low-fat ice cream  Fatty, marbled beef and pork Lean beef, pork,  or venison  Poultry with skin Poultry without skin  Butter, stick margarine Reduced-fat, whipped, or liquid spreads  Coconut oil, palm oil Liquid vegetable oils: corn, canola, olive, soybean and safflower oils   Avoid foods that contain trans fats.  Trans fats increase levels of LDL-cholesterol. Hydrogenated fat in processed foods is the main source of trans fats in foods.  Trans fats can be found in stick margarine, shortening, processed sweets, baked goods, some fried foods, and packaged foods made with hydrogenated oils. Avoid foods with "partially hydrogenated oil" on the ingredient list such as: cookies, pastries, baked goods, biscuits, crackers, microwave popcorn, and frozen dinners. Choose foods with heart healthy fats.  Polyunsaturated and monounsaturated fat are unsaturated fats that may help lower your blood cholesterol level when used in place of saturated fat in your diet.  Ask your RDN about taking a dietary supplement with plant sterols and stanols to help lower your cholesterol level.  Research shows that substituting saturated fats with unsaturated fats is beneficial to cholesterol levels. Try these easy swaps:  Instead of. Try:  Butter, stick margarine, or solid shortening Reduced-fat, whipped, or liquid spreads  Beef, pork, or poultry with skin  Fish and seafood  Chips, crackers, snack foods Raw or unsalted nuts and seeds or nut butters Hummus with vegetables Avocado on toast  Coconut oil, palm oil Liquid vegetable oils: corn, canola, olive, soybean and safflower oils   Limit the amount of cholesterol you eat to less than 200 milligrams per day.  Cholesterol is a substance carried through the bloodstream via lipoproteins, which are known as "transporters" of fat. Some body functions need cholesterol to work properly, but too much cholesterol in  the bloodstream can damage arteries and build up blood vessel linings (which can lead to heart attack and stroke). You  should eat less than 200 milligrams cholesterol per day.  People respond differently to eating cholesterol. There is no test available right now that can figure out which people will respond more to dietary cholesterol and which will respond less. For individuals with high intake of dietary cholesterol, different types of increase (none, small, moderate, large) in LDL-cholesterol levels are all possible.   Food sources of cholesterol include egg yolks and organ meats such as liver, gizzards. Limit egg yolks to two to four per week and avoid organ meats like liver and gizzards to control cholesterol intake. Tips for Choosing Heart-Healthy Carbohydrates Consume a consistent amount of carbohydrate  It is important to eat foods with carbohydrates in moderation because they impact your blood glucose level. Carbohydrates can be found in many foods such as: ? Grains (breads, crackers, rice, pasta, and cereals) ? Starchy Vegetables (potatoes, corn, and peas) ? Beans and legumes ? Milk, soy milk, and yogurt ? Fruit and fruit juice ? Sweets (cakes, cookies, ice cream, jam and jelly)  Your RDN will help you set a goal for how many carbohydrate servings to eat at your meals and snacks. For many adults, eating 3 to 5 servings of carbohydrate foods at each meal and 1 or 2 carbohydrate servings for each snack works well.  Check your blood glucose level regularly. It can tell you if you need to adjust when you eat carbohydrates. Choose foods rich in viscous (soluble) fiber  Viscous, or soluble, is found in the walls of plant cells. Viscous fiber is found only in plant-based foods. Eating foods with fiber helps to lower your unhealthy cholesterol and keep your blood glucose in range ? Rich sources of viscous fiber include vegetables (asparagus, Brussels sprouts, sweet potatoes, turnips) fruit (apricots, mangoes, oranges), legumes, and whole grains (barley, oats, and oat bran).  As you increase your fiber  intake gradually, also increase the amount of water you drink. This will help prevent constipation.  If you have difficulty achieving this goal, ask your RDN about fiber laxatives. Choose fiber supplements made with viscous fibers such as psyllium seed husks or methylcellulose to help lower unhealthy cholesterol.  Limit refined carbohydrates   There are three types of carbohydrates: starches, sugar, and fiber. Some carbohydrates occur naturally in food, like the starches in rice or corn or the sugars in fruits and milk. Refined carbohydrates--foods with high amounts of simple sugars--can raise triglyceride levels. High triglyceride levels are associated with coronary heart disease.  Some examples of refined carbohydrate foods are table sugar, sweets, and beverages sweetened with added sugar. Tips for Reducing Sodium (Salt) Although sodium is important for your body to function, too much sodium can be harmful for people with high blood pressure. As sodium and fluid buildup in your tissues and bloodstream, your blood pressure increases. High blood pressure may cause damage to other organs and increase your risk for a stroke. Even if you take a pill for blood pressure or a water pill (diuretic) to remove fluid, it is still important to have less salt in your diet. Ask your doctor and RDN what amount of sodium is right for you.  Avoid processed foods. Eat more fresh foods. ? Fresh fruits and vegetables are naturally low in sodium, as well as frozen vegetables and fruits that have no added juices or sauces. ? Fresh meats are lower in sodium than processed  meats, such as bacon, sausage, and hotdogs. Read the nutrition label or ask your butcher to help you find a fresh meat that is low in sodium.  Eat less salt--at the table and when cooking. ? A single teaspoon of table salt has 2,300 mg of sodium. ? Leave the salt out of recipes for pasta, casseroles, and soups. ? Ask your RDN how to cook your  favorite recipes without sodium  Be a smart shopper. ? Look for food packages that say "salt-free" or "sodium-free." These items contain less than 5 milligrams of sodium per serving. ? "Very low-sodium" products contain less than 35 milligrams of sodium per serving. ? "Low-sodium" products contain less than 140 milligrams of sodium per serving. ? Beware for "Unsalted" or "No Added Salt" products. These items may still be high in sodium. Check the nutrition label.  Add flavors to your food without adding sodium. ? Try lemon juice, lime juice, fruit juice or vinegar. ? Dry or fresh herbs add flavor. Try basil, bay leaf, dill, rosemary, parsley, sage, dry mustard, nutmeg, thyme, and paprika. ? Pepper, red pepper flakes, and cayenne pepper can add spice t your meals without adding sodium. Hot sauce contains sodium, but if you use just a drop or two, it will not add up to much. ? Buy a sodium-free seasoning blend or make your own at home.  Additional Lifestyle Tips Achieve and maintain a healthy weight.  Talk with your RDN or your doctor about what is a healthy weight for you.  Set goals to reach and maintain that weight.  To lose weight, reduce your calorie intake along with increasing your physical activity. A weight loss of 10 to 15 pounds could reduce LDL-cholesterol by 5 milligrams per deciliter. Participate in physical activity.  Talk with your health care team to find out what types of physical activity are best for you. Set a plan to get about 30 minutes of exercise on most days.  Foods Recommended Food Group Foods Recommended  Grains Whole grain breads and cereals, including whole wheat, barley, rye, buckwheat, corn, teff, quinoa, millet, amaranth, brown or wild rice, sorghum, and oats Pasta, especially whole wheat or other whole grain types  [employer] [place] [employer], quinoa or wild rice Whole grain crackers, bread, rolls, pitas Home-made bread with reduced-sodium baking soda  Protein Foods  Lean cuts of beef and pork (loin, leg, round, extra lean hamburger)  Skinless [employer] photographer and other wild game Dried beans and peas Nuts and nut butters Meat alternatives made with soy or textured vegetable protein  Egg whites or egg substitute Cold cuts made with lean meat or soy protein  Dairy Nonfat (skim), low-fat, or 1%-fat milk  Nonfat or low-fat yogurt or cottage cheese Fat-free and low-fat cheese  Vegetables Fresh, frozen, or canned vegetables without added fat or salt   Fruits Fresh, frozen, canned, or dried fruit   Oils Unsaturated oils (corn, olive, peanut, soy, sunflower, canola)  Soft or liquid margarines and vegetable oil spreads  Salad dressings Seeds and nuts  Avocado   Foods Not Recommended Food Group Foods Not Recommended  Grains Breads or crackers topped with salt Cereals (hot or cold) with more than 300 mg sodium per serving Biscuits, cornbread, and other "quick" breads prepared with baking soda Bread crumbs or stuffing mix from a store High-fat bakery products, such as doughnuts, biscuits, croissants, danish pastries, pies, cookies Instant cooking foods to which you add hot water and stir--potatoes, noodles, rice, etc. Packaged starchy foods--seasoned noodle or  rice dishes, stuffing mix, macaroni and cheese dinner Snacks made with partially hydrogenated oils, including chips, cheese puffs, snack mixes, regular crackers, butter-flavored popcorn  Protein Foods Higher-fat cuts of meats (ribs, t-bone steak, regular hamburger) Bacon, sausage, or hot dogs Cold cuts, such as salami or bologna, deli meats, cured meats, corned beef Organ meats (liver, brains, gizzards, sweetbreads) Poultry with skin Fried or smoked meat, poultry, and fish Whole eggs and egg yolks (more than 2-4 per week) Salted legumes, nuts, seeds, or nut/seed butters Meat alternatives with high levels of sodium (>300 mg per serving) or saturated fat (>5 g per serving)  Dairy Whole milk,?2%  fat milk, buttermilk Whole milk yogurt or ice cream Cream Half-&-half Cream cheese Sour cream Cheese  Vegetables Canned or frozen vegetables with salt, fresh vegetables prepared with salt, butter, cheese, or cream sauce Fried vegetables Pickled vegetables such as olives, pickles, or sauerkraut  Fruits Fried fruits Fruits served with butter or cream  Oils Butter, stick margarine, shortening Partially hydrogenated oils or trans fats Tropical oils (coconut, palm, palm kernel oils)  Other Candy, sugar sweetened soft drinks and desserts Salt, sea salt, garlic salt, and seasoning mixes containing salt Bouillon cubes Ketchup, barbecue sauce, Worcestershire sauce, soy sauce, teriyaki sauce Miso Salsa Pickles, olives, relish    Heart Healthy Consistent Carbohydrate Sample 1-Day Menu View Nutrient Info  Breakfast 1 cup cooked oatmeal (2 carbohydrate servings) 3/4 cup blueberries (1 carbohydrate serving) 1 ounce almonds 1 cup skim milk (1 carbohydrate serving) 1 cup coffee  Morning Snack 1 cup sugar-free nonfat yogurt (1 carbohydrate serving)  Lunch 2 slices whole-wheat bread (2 carbohydrate servings) 2 ounces lean [place] breast 1 ounce low-fat Swiss cheese 1 teaspoon mustard 1 slice tomato 1 lettuce leaf 1 small pear (1 carbohydrate serving) 1 cup skim milk (1 carbohydrate serving)  Afternoon Snack 1 ounce trail mix with unsalted nuts, seeds, and raisins (1 carbohydrate serving)  Evening Meal 3 ounces salmon 2/3 cup cooked brown rice (2 carbohydrate servings) 1 teaspoon soft margarine 1 cup cooked broccoli with 1/2 cup cooked carrots (1 carbohydrate serving Carrots, cooked, boiled, drained, without salt 1 cup lettuce 1 teaspoon olive oil with vinegar for dressing 1 small whole grain roll (1 carbohydrate serving) 1 teaspoon soft margarine 1 cup unsweetened tea  Evening Snack 1 extra-small banana (1 carbohydrate serving)  Daily Sum Nutrient Unit Value  Macronutrients  Energy  kcal 1938  Energy kJ 8105  Protein g 110  Total lipid (fat) g 70  Carbohydrate, by difference g 232  Fiber, total dietary g 34  Sugars, total g 84  Minerals  Calcium, Ca mg 1359  Iron, Fe mg 12  Sodium, Na mg 1905  Vitamins  Vitamin C, total ascorbic acid mg 123  Vitamin A, IU IU 22253  Vitamin D IU 232  Lipids  Fatty acids, total saturated g 13  Fatty acids, total monounsaturated g 30  Fatty acids, total polyunsaturated g 21  Cholesterol mg 113

## 2020-08-21 NOTE — Plan of Care (Signed)
  Problem: Education: Goal: Understanding of CV disease, CV risk reduction, and recovery process will improve 08/21/2020 0810 by Manuela Neptune, RN Outcome: Progressing 08/21/2020 0810 by Manuela Neptune, RN Outcome: Progressing   Problem: Activity: Goal: Ability to return to baseline activity level will improve Outcome: Progressing   Problem: Education: Goal: Knowledge of General Education information will improve Description: Including pain rating scale, medication(s)/side effects and non-pharmacologic comfort measures Outcome: Progressing   Problem: Clinical Measurements: Goal: Cardiovascular complication will be avoided Outcome: Progressing   Problem: Nutrition: Goal: Adequate nutrition will be maintained Outcome: Progressing   Problem: Pain Managment: Goal: General experience of comfort will improve Outcome: Progressing   Problem: Safety: Goal: Ability to remain free from injury will improve Outcome: Progressing   Problem: Skin Integrity: Goal: Risk for impaired skin integrity will decrease Outcome: Progressing   Problem: Skin Integrity: Goal: Risk for impaired skin integrity will decrease Outcome: Progressing   Problem: Tissue Perfusion: Goal: Adequacy of tissue perfusion will improve Outcome: Progressing

## 2020-08-21 NOTE — Progress Notes (Signed)
Pt needs follow up consults from [facility] & DM coordinator prior to discharge. Orders previously placed for [facility] & DM coordinator while pt was at [facility] but it appears consults may not have been fully completed d/t pt's condition & need for transfer to [facility]. Also, pt requesting another copy of the phone number for [facility] that was provided by [facility] team d/tpossibly being lost in transfer from [facility] to [facility]

## 2020-08-21 NOTE — Progress Notes (Signed)
PROGRESS NOTE  Darlene Guzman ZOX:[id number] DOB: 01-14-1976 DOA: 08/19/2020 PCP: Eyvonne Mechanic, FNP  HPI/Recap of past 24 hours: HPI from Dr Frederick Peers Ms. Warringeris a 44 y.o.femalewith PMH DMII, HTN (uncontrolled), depression, financial constraints/homelessness who presented to the hospital with CP and pain/bubbling sensation in her stomach which was radiating up to her shoulders. She states she typically has that sensation but it has been becoming more frequent and lasting longer over the past several weeks to months. Pt has difficulty managing her blood pressure and states it usually runs high.  She lives out of her car and does follow with a primary physician in [place]. On admission she also endorsed some blurry vision and symptoms of polydipsia/polyuria. She did endorse chewing tobacco (does not smoke cigarettes), denied alcohol or illicit drug use. EKG revealed chronic left bundle branch block.  Troponins were checked and initial troponin was 122, was started on a heparin drip and cardiology was consulted.  She underwent heart catheterization on 08/20/2020.  There was no large stenosis but notable for microvascular disease.  She was recommended to continue on aspirin and Plavix for 6 months and ongoing lifestyle modification.     Today, patient continues to complain of some shortness of breath especially on exertion, dizziness, fatigue and bilateral legs giving out upon ambulation.  Patient denies any further chest pain, abdominal pain, nausea/vomiting, fever/chills.   Assessment/Plan: Principal Problem:   Unstable angina (HCC) Active Problems:   DM (diabetes mellitus), type 2 with complications (HCC)   Essential hypertension   HLD (hyperlipidemia)   Morbid obesity with BMI of 50.0-59.9, adult (HCC)   Depression   Chronic diastolic CHF (congestive heart failure) (HCC)   Acute coronary syndrome Possible microvascular disease ?Unstable angina Currently chest  pain-free, complaining of dizziness, fatigue Troponin trended up, EKG shows chronic left bundle branch block Cardiology consulted, s/p heart cath on 12/9 which showed no large stenosis, shows microvascular disease Continue aspirin, Plavix for 6 months per cardiology, continue Lipitor Advised on lifestyle modification especially weight loss Telemetry  Chronic systolic and diastolic HF Appears stable Echo showed EF of 45 to 50%, left ventricle demonstrates regional wall motion abnormality, grade 1 diastolic dysfunction, moderate hypokinesis Cardiology consulted, continue hydralazine, lisinopril Telemetry  Hypertension BP now controlled Continue hydralazine, lisinopril, amlodipine  Hyperlipidemia LDL 105 Continue statins  Diabetes mellitus type 2 with hyperglycemia A1c done on 08/19/2020 was 8.8 SSI, Accu-Cheks, hypoglycemic protocol  Ambulatory dysfunction Reported issues with legs giving way CT head with no acute intracranial pathology CT lumbar/thoracic spine pending PT/OT Fall precautions  Depression Continue amitriptyline  Tobacco abuse Reports chewing tobacco Advised to quit Nicotine patch ordered  Morbid obesity BMI 49 Modification advised  Homelessness TOC consulted for medication assistance as well        Malnutrition Type:      Malnutrition Characteristics:      Nutrition Interventions:       Estimated body mass index is 49.13 kg/m as calculated from the following:   Height as of this encounter: 5' (1.524 m).   Weight as of this encounter: 114.1 kg.     Code Status: Full  Family Communication: Discussed extensively with patient  Disposition Plan: Status is: Inpatient  Remains inpatient appropriate because:Inpatient level of care appropriate due to severity of illness   Dispo: The patient is from: Home (lives in car)              Anticipated d/c is to: Home (?shelter)  Anticipated d/c date is: 1 day               Patient currently is not medically stable to d/c.    Consultants:  Cardiology  Procedures:  LHC on 08/20/2020  Antimicrobials:  None  DVT prophylaxis: Lovenox   Objective: Vitals:   08/21/20 0047 08/21/20 0504 08/21/20 0508 08/21/20 0723  BP: (!) 149/81 132/81 132/81 137/77  Pulse: 83 71 86   Resp: 18 18    Temp: 97.7 F (36.5 C) 97.8 F (36.6 C)    TempSrc: Oral Oral    SpO2: 99%  99%   Weight:  114.1 kg    Height:        Intake/Output Summary (Last 24 hours) at 08/21/2020 1353 Last data filed at 08/20/2020 2045 Gross per 24 hour  Intake 1995.36 ml  Output 300 ml  Net 1695.36 ml   Filed Weights   08/20/20 1022 08/21/20 0504  Weight: 117.5 kg 114.1 kg    Exam:  General: NAD, morbidly obese  Cardiovascular: S1, S2 present  Respiratory: CTAB  Abdomen: Soft, nontender, obese, bowel sounds present  Musculoskeletal: No bilateral pedal edema noted  Skin: Normal  Psychiatry: Normal mood   Data Reviewed: CBC: Recent Labs  Lab 08/19/20 1509 08/20/20 0436 08/20/20 1451 08/21/20 0040  WBC 8.2 9.8 6.6 7.8  NEUTROABS  --  5.6 3.8  --   HGB 13.5 12.8 12.5 11.2*  HCT 40.7 39.7 39.0 35.0*  MCV 83.4 85.0 84.2 83.9  PLT 272 278 213 217   Basic Metabolic Panel: Recent Labs  Lab 08/19/20 1509 08/20/20 0318 08/21/20 0040  NA 132* 133* 133*  K 4.1 3.6 4.2  CL 99 101 99  CO2 22 22 24   GLUCOSE 366* 214* 152*  BUN 13 13 12   CREATININE 0.86 0.71 0.78  CALCIUM 9.1 9.3 9.1  MG 1.9 1.8  --   PHOS  --  3.1  --    GFR: Estimated Creatinine Clearance: 103.3 mL/min (by C-G formula based on SCr of 0.78 mg/dL). Liver Function Tests: Recent Labs  Lab 08/19/20 1530 08/20/20 0318  AST 23 19  ALT 22 21  ALKPHOS 61 55  BILITOT 0.6 0.5  PROT 7.3 6.8  ALBUMIN 4.0 3.8   Recent Labs  Lab 08/19/20 1530  LIPASE 33   No results for input(s): AMMONIA in the last 168 hours. Coagulation Profile: Recent Labs  Lab 08/20/20 0910  INR 1.0   Cardiac  Enzymes: No results for input(s): CKTOTAL, CKMB, CKMBINDEX, TROPONINI in the last 168 hours. BNP (last 3 results) No results for input(s): PROBNP in the last 8760 hours. HbA1C: Recent Labs    08/19/20 1954  HGBA1C 8.8*   CBG: Recent Labs  Lab 08/20/20 1342 08/20/20 1605 08/20/20 2050 08/21/20 0836 08/21/20 1122  GLUCAP 166* 170* 207* 170* 256*   Lipid Profile: Recent Labs    08/20/20 0436  CHOL 206*  HDL 37*  LDLCALC 105*  TRIG 321*  CHOLHDL 5.6   Thyroid Function Tests: Recent Labs    08/20/20 0436  TSH 2.567   Anemia Panel: No results for input(s): VITAMINB12, FOLATE, FERRITIN, TIBC, IRON, RETICCTPCT in the last 72 hours. Urine analysis:    Component Value Date/Time   COLORURINE YELLOW 08/19/2020 1506   APPEARANCEUR HAZY (A) 08/19/2020 1506   LABSPEC 1.029 08/19/2020 1506   PHURINE 6.0 08/19/2020 1506   GLUCOSEU >=500 (A) 08/19/2020 1506   HGBUR NEGATIVE 08/19/2020 1506   BILIRUBINUR NEGATIVE 08/19/2020 1506  KETONESUR NEGATIVE 08/19/2020 1506   PROTEINUR NEGATIVE 08/19/2020 1506   UROBILINOGEN 0.2 07/05/2018 1530   NITRITE NEGATIVE 08/19/2020 1506   LEUKOCYTESUR NEGATIVE 08/19/2020 1506   Sepsis Labs: @LABRCNTIP (procalcitonin:4,lacticidven:4)  ) Recent Results (from the past 240 hour(s))  Urine culture     Status: Abnormal   Collection Time: 08/19/20  3:06 PM   Specimen: Urine, Random  Result Value Ref Range Status   Specimen Description   Final    URINE, RANDOM Performed at [facility], [address] [place], [place] [address]    Special Requests   Final    NONE Performed at [facility], [address] [place], [place] [address]    Culture (A)  Final    <10,000 COLONIES/mL INSIGNIFICANT GROWTH Performed at [facility], [address] [place], [place] [address]    Report Status 08/20/2020 FINAL  Final  Resp Panel by RT-PCR (Flu A&B, Covid) Nasopharyngeal Swab     Status: None    Collection Time: 08/19/20  6:50 PM   Specimen: Nasopharyngeal Swab; Nasopharyngeal(NP) swabs in vial transport medium  Result Value Ref Range Status   SARS Coronavirus 2 by RT PCR NEGATIVE NEGATIVE Final    Comment: (NOTE) SARS-CoV-2 target nucleic acids are NOT DETECTED.  The SARS-CoV-2 RNA is generally detectable in upper respiratory specimens during the acute phase of infection. The lowest concentration of SARS-CoV-2 viral copies this assay can detect is 138 copies/mL. A negative result does not preclude SARS-Cov-2 infection and should not be used as the sole basis for treatment or other patient management decisions. A negative result may occur with  improper specimen collection/handling, submission of specimen other than nasopharyngeal swab, presence of viral mutation(s) within the areas targeted by this assay, and inadequate number of viral copies(<138 copies/mL). A negative result must be combined with clinical observations, patient history, and epidemiological information. The expected result is Negative.  Fact Sheet for Patients:  BloggerCourse.comhttps://www.fda.gov/media/152166/download  Fact Sheet for Healthcare Providers:  SeriousBroker.ithttps://www.fda.gov/media/152162/download  This test is no t yet approved or cleared by the [place] FDA and  has been authorized for detection and/or diagnosis of SARS-CoV-2 by FDA under an Emergency Use Authorization (EUA). This EUA will remain  in effect (meaning this test can be used) for the duration of the COVID-19 declaration under Section 564(b)(1) of the Act, 21 U.S.C.section 360bbb-3(b)(1), unless the authorization is terminated  or revoked sooner.       Influenza A by PCR NEGATIVE NEGATIVE Final   Influenza B by PCR NEGATIVE NEGATIVE Final    Comment: (NOTE) The Xpert Xpress SARS-CoV-2/FLU/RSV plus assay is intended as an aid in the diagnosis of influenza from Nasopharyngeal swab specimens and should not be used as a sole basis for treatment.  Nasal washings and aspirates are unacceptable for Xpert Xpress SARS-CoV-2/FLU/RSV testing.  Fact Sheet for Patients: BloggerCourse.comhttps://www.fda.gov/media/152166/download  Fact Sheet for Healthcare Providers: SeriousBroker.ithttps://www.fda.gov/media/152162/download  This test is not yet approved or cleared by the [place] FDA and has been authorized for detection and/or diagnosis of SARS-CoV-2 by FDA under an Emergency Use Authorization (EUA). This EUA will remain in effect (meaning this test can be used) for the duration of the COVID-19 declaration under Section 564(b)(1) of the Act, 21 U.S.C. section 360bbb-3(b)(1), unless the authorization is terminated or revoked.  Performed at [facility], [address] [place], [place] [address]       Studies: No results found.  Scheduled Meds: . amitriptyline  25 mg  Oral QHS  . amLODipine  5 mg Oral Daily  . aspirin EC  81 mg Oral Daily  . atorvastatin  80 mg Oral Daily  . chloroprocaine  20 mL Infiltration Once  . clopidogrel  75 mg Oral Q breakfast  . diphenhydrAMINE  12.5 mg Intravenous Once  . hydrALAZINE  25 mg Oral Q6H  . insulin aspart  0-5 Units Subcutaneous QHS  . insulin aspart  0-9 Units Subcutaneous TID WC  . lisinopril  40 mg Oral Daily  . [START ON 08/22/2020] nicotine  21 mg Transdermal Daily  . sodium chloride flush  3 mL Intravenous Q12H    Continuous Infusions: . sodium chloride       LOS: 1 day     Briant Cedar, MD [facility]  If 7PM-7AM, please contact night-coverage [email or web address] 08/21/2020, 1:53 PM

## 2020-08-21 NOTE — Consult Note (Signed)
Ref: Eyvonne Mechanic, FNP   Subjective:  Awake. Dizzy with normal BP. Patient aware of diffuse narrowing of coronaries with possible microvascular disease. She understood to take better care of diabetes, BP, diet and activity. May need walker to get adjusted to normal BP as she used to run high pressures and high sugar levels at home for quite some time.  Objective:  Vital Signs in the last 24 hours: Temp:  [97.7 F (36.5 C)-98.4 F (36.9 C)] 97.8 F (36.6 C) (12/10 0504) Pulse Rate:  [62-116] 86 (12/10 0508) Cardiac Rhythm: Sinus tachycardia (12/10 0701) Resp:  [11-22] 18 (12/10 0504) BP: (112-177)/(58-110) 137/77 (12/10 0723) SpO2:  [94 %-100 %] 99 % (12/10 0508) Weight:  [114.1 kg-117.5 kg] 114.1 kg (12/10 0504)  Physical Exam: BP Readings from Last 1 Encounters:  08/21/20 137/77     Wt Readings from Last 1 Encounters:  08/21/20 114.1 kg    Weight change:  Body mass index is 49.13 kg/m. HEENT: [place]/AT, Eyes-Hazel, Conjunctiva-Pink, Sclera-Non-icteric Neck: No JVD, No bruit, Trachea midline. Lungs:  Clear, Bilateral. Cardiac:  Regular rhythm, normal S1 and S2, no S3. II/VI systolic murmur. Abdomen:  Soft, non-tender. BS present. Extremities:  No edema present. No cyanosis. No clubbing. No right groin swelling or discharge. CNS: AxOx3, Cranial nerves grossly intact, moves all 4 extremities.  Skin: Warm and dry.   Intake/Output from previous day: 12/09 0701 - 12/10 0700 In: 2995.4 [P.O.:840; I.V.:2155.4] Out: 300 [Urine:300]    Lab Results: BMET    Component Value Date/Time   NA 133 (L) 08/21/2020 0040   NA 133 (L) 08/20/2020 0318   NA 132 (L) 08/19/2020 1509   NA 136 07/18/2013 0431   K 4.2 08/21/2020 0040   K 3.6 08/20/2020 0318   K 4.1 08/19/2020 1509   K 3.7 07/18/2013 0431   CL 99 08/21/2020 0040   CL 101 08/20/2020 0318   CL 99 08/19/2020 1509   CL 106 07/18/2013 0431   CO2 24 08/21/2020 0040   CO2 22 08/20/2020 0318   CO2 22 08/19/2020 1509    CO2 25 07/18/2013 0431   GLUCOSE 152 (H) 08/21/2020 0040   GLUCOSE 214 (H) 08/20/2020 0318   GLUCOSE 366 (H) 08/19/2020 1509   GLUCOSE 105 (H) 07/18/2013 0431   BUN 12 08/21/2020 0040   BUN 13 08/20/2020 0318   BUN 13 08/19/2020 1509   BUN 17 07/18/2013 0431   CREATININE 0.78 08/21/2020 0040   CREATININE 0.71 08/20/2020 0318   CREATININE 0.86 08/19/2020 1509   CREATININE 0.85 07/18/2013 0431   CALCIUM 9.1 08/21/2020 0040   CALCIUM 9.3 08/20/2020 0318   CALCIUM 9.1 08/19/2020 1509   CALCIUM 9.0 07/18/2013 0431   GFRNONAA >60 08/21/2020 0040   GFRNONAA >60 08/20/2020 0318   GFRNONAA >60 08/19/2020 1509   GFRNONAA >60 07/18/2013 0431   GFRAA >60 04/01/2020 0117   GFRAA >60 09/28/2019 2234   GFRAA >60 09/19/2019 1613   GFRAA >60 07/18/2013 0431   CBC    Component Value Date/Time   WBC 7.8 08/21/2020 0040   RBC 4.17 08/21/2020 0040   HGB 11.2 (L) 08/21/2020 0040   HCT 35.0 (L) 08/21/2020 0040   PLT 217 08/21/2020 0040   MCV 83.9 08/21/2020 0040   MCH 26.9 08/21/2020 0040   MCHC 32.0 08/21/2020 0040   RDW 14.7 08/21/2020 0040   LYMPHSABS 2.0 08/20/2020 1451   MONOABS 0.6 08/20/2020 1451   EOSABS 0.1 08/20/2020 1451   BASOSABS 0.0 08/20/2020 1451  HEPATIC Function Panel Recent Labs    09/28/19 2234 08/19/20 1530 08/20/20 0318  PROT 7.5 7.3 6.8   HEMOGLOBIN A1C No components found for: HGA1C,  MPG CARDIAC ENZYMES Lab Results  Component Value Date   CKTOTAL 105 07/18/2013   TROPONINI <0.03 03/02/2019   TROPONINI <0.30 09/30/2013   BNP No results for input(s): PROBNP in the last 8760 hours. TSH Recent Labs    08/20/20 0436  TSH 2.567   CHOLESTEROL Recent Labs    08/20/20 0436  CHOL 206*    Scheduled Meds: . amitriptyline  25 mg Oral QHS  . amLODipine  5 mg Oral Daily  . aspirin EC  81 mg Oral Daily  . atorvastatin  80 mg Oral Daily  . chloroprocaine  20 mL Infiltration Once  . clopidogrel  75 mg Oral Q breakfast  . diphenhydrAMINE  12.5 mg  Intravenous Once  . hydrALAZINE  25 mg Oral Q6H  . insulin aspart  0-5 Units Subcutaneous QHS  . insulin aspart  0-9 Units Subcutaneous TID WC  . lisinopril  40 mg Oral Daily  . nicotine  14 mg Transdermal Daily  . sodium chloride flush  3 mL Intravenous Q12H   Continuous Infusions: . sodium chloride     PRN Meds:.sodium chloride, acetaminophen **OR** acetaminophen, albuterol, hydrALAZINE, labetalol, ondansetron (ZOFRAN) IV, sodium chloride flush  Assessment/Plan: Acute coronary syndrome Possible microvascular disease Type 2 DM with hyperglycemia Morbid obesity  Increase activity with walker use.    LOS: 1 day   Time spent including chart review, lab review, examination, discussion with patient/Nurse : 30 min   Orpah Cobb  MD  08/21/2020, 9:29 AM

## 2020-08-21 NOTE — Evaluation (Signed)
Occupational Therapy Evaluation Patient Details Name: Darlene Guzman MRN: [id number] DOB: [birth year] Today's Date: 08/21/2020    History of Present Illness 44 yo female presenting with blurry vision and chest pains with elevated troppnin. COVID test negative; received vaccine last October. EKG revealed chronic left bundle branch block. Underwent heart catheterization on 08/20/2020. PMH including DM type 2, HTN, and depression.   Clinical Impression   PTA, pt was living in her car and was performing BADLs with increased effort due to fatigue; pt reports she hasn't been able to keep a job due to her fatigue, drowsiness, and "legs giving out". Pt reporting "I have had several jobs but I keep getting laid off because I am a liability when my legs give out." Pt currently requiring Min Guard-Min A for LB ADLs and Min Guard A for functional mobility. Pt presenting with poor activity tolerance as she becomes quickly dizziness and fatigued with minimal activity. BP stable but low for her normal. Pt would benefit from further acute OT to facilitate safe dc. Recommend dc to home with follow up at OP OT for further OT to optimize safety, independence with ADLs, and return to PLOF.   BP supine 130/78. BP sitting at EOB 129/83. BP after mobility 130/75. HR 90-100. SpO2 90s on RA    Follow Up Recommendations  Outpatient OT    Equipment Recommendations  Other (comment) (Rollator)    Recommendations for Other Services PT consult     Precautions / Restrictions Precautions Precautions: Fall;Other (comment) Precaution Comments: Watch BP      Mobility Bed Mobility Overal bed mobility: Modified Independent             General bed mobility comments: increased time    Transfers Overall transfer level: Needs assistance Equipment used: None Transfers: Sit to/from Stand Sit to Stand: Min guard         General transfer comment: Min GUard for safety due to dizziness. No physical A needed     Balance Overall balance assessment: No apparent balance deficits (not formally assessed)                                         ADL either performed or assessed with clinical judgement   ADL Overall ADL's : Needs assistance/impaired Eating/Feeding: Set up;Sitting   Grooming: Min guard;Wash/dry face;Standing   Upper Body Bathing: Set up;Sitting   Lower Body Bathing: Min guard;Sit to/from stand   Upper Body Dressing : Set up;Sitting   Lower Body Dressing: Minimal assistance;Sit to/from stand   Toilet Transfer: Supervision/safety;Ambulation;Regular Toilet           Functional mobility during ADLs: Min guard General ADL Comments: Pt presenting with limited activity tolerance as seen by fatigue and dizziness. BP stable but low for patient as her normal is usually between 150-180     Vision         Perception     Praxis      Pertinent Vitals/Pain Pain Assessment: Faces Faces Pain Scale: Hurts even more Pain Location: Outside of right thigh with stretching in recliner Pain Descriptors / Indicators: Grimacing Pain Intervention(s): Monitored during session     Hand Dominance Right   Extremity/Trunk Assessment Upper Extremity Assessment Upper Extremity Assessment: Overall WFL for tasks assessed   Lower Extremity Assessment Lower Extremity Assessment: Defer to PT evaluation   Cervical / Trunk Assessment Cervical / Trunk Assessment: Other exceptions  Cervical / Trunk Exceptions: Increased body habtius   Communication Communication Communication: No difficulties   Cognition Arousal/Alertness: Awake/alert Behavior During Therapy: WFL for tasks assessed/performed Overall Cognitive Status: Within Functional Limits for tasks assessed                                     General Comments  BP supine 130/78. BP sitting at EOB 129/83. BP after mobility 130/75. HR 90-100. SpO2 90s on RA    Exercises Exercises: Other exercises Other  Exercises Other Exercises: Educating pt on general LB AROM while sitting in recliner   Shoulder Instructions      Home Living Family/patient expects to be discharged to:: Shelter/Homeless                                 Additional Comments: Pt has been living out of her car      Prior Functioning/Environment Level of Independence: Independent        Comments: Pt reporting that in the last couple of month, she has been getting weaker and is unable to maintain a job due to her fatigue and "passing out" episodes. Pt reports that LB ADLs have been very difficult because if she ebnds forward then she becomes light headed.        OT Problem List: Decreased strength;Decreased range of motion;Decreased activity tolerance;Impaired balance (sitting and/or standing);Decreased knowledge of use of DME or AE;Decreased knowledge of precautions;Pain;Obesity      OT Treatment/Interventions: Self-care/ADL training;Therapeutic exercise;Energy conservation;DME and/or AE instruction;Patient/family education;Therapeutic activities    OT Goals(Current goals can be found in the care plan section) Acute Rehab OT Goals Patient Stated Goal: Figure out whats wrong and get back to work OT Goal Formulation: With patient Time For Goal Achievement: 09/04/20 Potential to Achieve Goals: Good  OT Frequency: Min 2X/week   Barriers to D/C:            Co-evaluation              AM-PAC OT "6 Clicks" Daily Activity     Outcome Measure Help from another person eating meals?: None Help from another person taking care of personal grooming?: A Little Help from another person toileting, which includes using toliet, bedpan, or urinal?: A Little Help from another person bathing (including washing, rinsing, drying)?: A Little Help from another person to put on and taking off regular upper body clothing?: A Little Help from another person to put on and taking off regular lower body clothing?: A  Little 6 Click Score: 19   End of Session Nurse Communication: Mobility status  Activity Tolerance: Patient tolerated treatment well Patient left: in chair;with call bell/phone within reach  OT Visit Diagnosis: Unsteadiness on feet (R26.81);Other abnormalities of gait and mobility (R26.89);Muscle weakness (generalized) (M62.81);Pain Pain - Right/Left: Right Pain - part of body: Leg                Time: 2130-8657 OT Time Calculation (min): 34 min Charges:  OT General Charges $OT Visit: 1 Visit OT Evaluation $OT Eval Moderate Complexity: 1 Mod OT Treatments $Self Care/Home Management : 8-22 mins  Kaeleen Odom MSOT, OTR/L Acute Rehab Pager: [phone] Office: [phone]  Theodoro Grist Chanika Byland 08/21/2020, 9:55 AM

## 2020-08-21 NOTE — TOC Initial Note (Signed)
Transition of Care [facility]) - Initial/Assessment Note    Patient Details  Name: Darlene Guzman MRN: [id number] Date of Birth: 1975-09-30  Transition of Care [facility]) CM/SW Contact:    Terrial Rhodes, LCSWA Phone Number: 08/21/2020, 3:22 PM  Clinical Narrative:         CSW spoke with patient at bedside. CSW provided patient with resources for area shelters per patients request. CSW pointed out contact information for [facility]. Patient told CSW that two days ago they had a bed for her. Patient says she will follow back up with them today to see if they can still offer her a bed. Patient requested resources for Medicaid and food. CSW provided patient with resources for medicaid and food. Patient was concerned about her car being towed in the [facility] parking lot. CSW received information from patient as to where car was parked. CSW called [employer] to make sure patients car would not be towed where it is parked. Security guard at [employer] told CSW that car would be fine and for patient not to worry. CSW updated patient. Patient will follow up with CSW to confirm that she has a bed at [facility].  CSW will continue to follow.  CSW will continue to follow.            Expected Discharge Plan: Homeless Shelter ([facility]) Barriers to Discharge: Continued Medical Work up   Patient Goals and CMS Choice Patient states their goals for this hospitalization and ongoing recovery are:: to go to [email or web address] Compare Post Acute Care list provided to:: Patient Choice offered to / list presented to : Patient  Expected Discharge Plan and Services Expected Discharge Plan: Homeless Shelter ([facility])                                              Prior Living Arrangements/Services   Lives with:: Self Patient language and need for interpreter reviewed:: Yes        Need for Family Participation in Patient Care: Yes (Comment) Care giver support  system in place?: Yes (comment)   Criminal Activity/Legal Involvement Pertinent to Current Situation/Hospitalization: No - Comment as needed  Activities of Daily Living Home Assistive Devices/Equipment: Eyeglasses (bifocal and distance glasses) ADL Screening (condition at time of admission) Patient's cognitive ability adequate to safely complete daily activities?: Yes Is the patient deaf or have difficulty hearing?: No Does the patient have difficulty seeing, even when wearing glasses/contacts?: No (wears glasses) Does the patient have difficulty concentrating, remembering, or making decisions?: No Patient able to express need for assistance with ADLs?: Yes Does the patient have difficulty dressing or bathing?: No Independently performs ADLs?: Yes (appropriate for developmental age) Does the patient have difficulty walking or climbing stairs?: Yes (gets sob) Weakness of Legs: None Weakness of Arms/Hands: None  Permission Sought/Granted Permission sought to share information with : Case Manager,Family Electrical engineer                Emotional Assessment Appearance:: Appears stated age Attitude/Demeanor/Rapport: Gracious Affect (typically observed): Calm Orientation: : Oriented to Self,Oriented to Place,Oriented to  Time,Oriented to Situation Alcohol / Substance Use: Not Applicable Psych Involvement: No (comment)  Admission diagnosis:  Hyperglycemia [R73.9] Elevated troponin [R77.8] Hypertensive urgency [I16.0] Chest pain, unspecified type [R07.9] Hypertension, unspecified type [I10] NSTEMI (non-ST elevated myocardial infarction) (HCC) [I21.4]  Patient Active Problem List   Diagnosis Date Noted  . Essential hypertension 08/20/2020  . Unstable angina (HCC) 08/20/2020  . HLD (hyperlipidemia) 08/20/2020  . Morbid obesity with BMI of 50.0-59.9, adult (HCC) 08/20/2020  . Depression   . Chronic diastolic CHF (congestive heart failure) (HCC)   .  Hyperglycemia 08/19/2020  . Elevated troponin 08/19/2020  . DM (diabetes mellitus), type 2 with complications (HCC) 08/19/2020   PCP:  Eyvonne Mechanic, FNP Pharmacy:   [facility] [phone] - [place], [place] - [address] AT [facility] [address] [place] [address] Phone: [phone] Fax: [phone]  [facility] [place], [place] - [address] [place] [address] Phone: [phone] Fax: [phone]     Social Determinants of Health (SDOH) Interventions    Readmission Risk Interventions No flowsheet data found.

## 2020-08-21 NOTE — Care Management (Addendum)
1635 08-21-20 Medications have been delivered to the unit for this patient. Staff is aware to provide patient with medications prior to transition home on Saturday. Gala Lewandowsky, RN,BSN Case Manager    1637 08-21-20 Patient lost her job two weeks ago. Recommendations for outpatient PT- pt is declining services; due to she cannot afford co pays for therapy. Gala Lewandowsky, RN,BSN Case Manager

## 2020-08-21 NOTE — Evaluation (Signed)
Physical Therapy Evaluation Patient Details Name: Darlene Guzman MRN: [id number] DOB: 06/30/1976 Today's Date: 08/21/2020   History of Present Illness  44 yo female presenting with blurry vision and chest pains with elevated troppnin. COVID test negative; received vaccine last October. EKG revealed chronic left bundle branch block. Underwent heart catheterization on 08/20/2020. PMH including DM type 2, HTN, and depression.  Clinical Impression  Pt was seen for mobility and noted BP readings that fluctuated from standing at 137/96 to after walk with light headed feeling at 122/69.  Sats and pulses were all WFL but for pt's recent BP findings has been low.  Had to sit during walk at half way point, and now will recommend CIR.  Pt may not be able to qualify, and therefore will need to follow up with outpatient care as is available to her.  [facility] program may be a possibility.  Follow acutely as tolerated for goals of PT.    Follow Up Recommendations CIR    Equipment Recommendations  Other (comment) Aeronautical engineer)    Recommendations for Other Services Rehab consult     Precautions / Restrictions Precautions Precautions: Fall;Other (comment) Precaution Comments: Watch BP Restrictions Weight Bearing Restrictions: No      Mobility  Bed Mobility Overal bed mobility: Modified Independent                  Transfers Overall transfer level: Needs assistance Equipment used: Rolling walker (2 wheeled) Transfers: Sit to/from Stand Sit to Stand: Supervision            Ambulation/Gait Ambulation/Gait assistance: Min assist;Min guard Gait Distance (Feet): 140 Feet (70 x 2) Assistive device: Rolling walker (2 wheeled);1 person hand held assist Gait Pattern/deviations: Decreased stride length;Trunk flexed;Wide base of support;Step-through pattern Gait velocity: reduced Gait velocity interpretation: <1.31 ft/sec, indicative of household ambulator General Gait Details: pt is up  to walk with a chair to follow and note her endurance and shaky feeling in legs hinder her walking distance.  Stairs            Wheelchair Mobility    Modified Rankin (Stroke Patients Only)       Balance Overall balance assessment: Needs assistance Sitting-balance support: Feet supported Sitting balance-Leahy Scale: Good                                       Pertinent Vitals/Pain Pain Assessment: No/denies pain    Home Living Family/patient expects to be discharged to:: Shelter/Homeless                 Additional Comments: Pt has been living out of her car    Prior Function Level of Independence: Independent         Comments: Getting light headed with mobility     Hand Dominance   Dominant Hand: Right    Extremity/Trunk Assessment   Upper Extremity Assessment Upper Extremity Assessment: Defer to OT evaluation    Lower Extremity Assessment Lower Extremity Assessment: Overall WFL for tasks assessed    Cervical / Trunk Assessment Cervical / Trunk Assessment: Normal Cervical / Trunk Exceptions: Increased body habtius  Communication   Communication: No difficulties  Cognition Arousal/Alertness: Awake/alert Behavior During Therapy: WFL for tasks assessed/performed Overall Cognitive Status: Within Functional Limits for tasks assessed  General Comments General comments (skin integrity, edema, etc.): pt is up to walk with help but sits halfway due to her lack of energy and feeling like her legs are too shaky    Exercises General Exercises - Lower Extremity Ankle Circles/Pumps: AROM;5 reps Quad Sets: AROM;10 reps Gluteal Sets: AROM;10 reps   Assessment/Plan    PT Assessment Patient needs continued PT services  PT Problem List Decreased strength;Decreased range of motion;Decreased activity tolerance;Decreased balance;Decreased mobility;Decreased coordination;Decreased  knowledge of use of DME;Cardiopulmonary status limiting activity;Decreased skin integrity       PT Treatment Interventions DME instruction;Gait training;Functional mobility training;Therapeutic activities;Therapeutic exercise;Balance training;Neuromuscular re-education;Patient/family education    PT Goals (Current goals can be found in the Care Plan section)  Acute Rehab PT Goals Patient Stated Goal: return to work PT Goal Formulation: With patient Time For Goal Achievement: 08/28/20 Potential to Achieve Goals: Good    Frequency Min 3X/week   Barriers to discharge Inaccessible home environment;Decreased caregiver support has no support yet and is living out of her car    Co-evaluation               AM-PAC PT "6 Clicks" Mobility  Outcome Measure Help needed turning from your back to your side while in a flat bed without using bedrails?: None Help needed moving from lying on your back to sitting on the side of a flat bed without using bedrails?: A Little Help needed moving to and from a bed to a chair (including a wheelchair)?: A Little Help needed standing up from a chair using your arms (e.g., wheelchair or bedside chair)?: A Little Help needed to walk in hospital room?: A Little Help needed climbing 3-5 steps with a railing? : Total 6 Click Score: 17    End of Session Equipment Utilized During Treatment: Gait belt Activity Tolerance: Patient limited by fatigue;Treatment limited secondary to medical complications (Comment) Patient left: in chair;with call bell/phone within reach;with chair alarm set Nurse Communication: Mobility status PT Visit Diagnosis: Unsteadiness on feet (R26.81);Muscle weakness (generalized) (M62.81);Dizziness and giddiness (R42)    Time: 6761-9509 PT Time Calculation (min) (ACUTE ONLY): 32 min   Charges:   PT Evaluation $PT Eval Moderate Complexity: 1 Mod PT Treatments $Gait Training: 8-22 mins       Ivar Drape 08/21/2020, 4:49  PM  Samul Dada, PT MS Acute Rehab Dept. Number: [facility] [id number] and [facility] [phone]

## 2020-08-21 NOTE — Progress Notes (Incomplete)
   08/21/20 1255  Vitals  Temp 98 F (36.7 C)  Temp Source Oral  BP (!) 143/84  MAP (mmHg) 101  BP Location Left Arm  BP Method Automatic  Patient Position (if appropriate) Lying  Pulse Rate 98  Pulse Rate Source Monitor  ECG Heart Rate 98  Resp 20  Level of Consciousness  Level of Consciousness Alert  MEWS COLOR  MEWS Score Color Green  MEWS Score  MEWS Temp 0  MEWS Systolic 0  MEWS Pulse 0  MEWS RR 0  MEWS LOC 0  MEWS Score 0   Patient called and said she didn't feel well. Complained of feeling sweaty. Room hot. Lowered thermostat

## 2020-08-22 DIAGNOSIS — E785 Hyperlipidemia, unspecified: Secondary | ICD-10-CM

## 2020-08-22 DIAGNOSIS — I160 Hypertensive urgency: Secondary | ICD-10-CM

## 2020-08-22 LAB — GLUCOSE, CAPILLARY
Glucose-Capillary: 144 mg/dL — ABNORMAL HIGH (ref 70–99)
Glucose-Capillary: 281 mg/dL — ABNORMAL HIGH (ref 70–99)

## 2020-08-22 MED ORDER — DULOXETINE HCL 60 MG PO CPEP: 60.0000 mg | ORAL_CAPSULE | Freq: Every day | ORAL | Status: DC

## 2020-08-22 MED ORDER — ALUM & MAG HYDROXIDE-SIMETH 200-200-20 MG/5ML PO SUSP
30.0000 mL | ORAL | Status: DC | PRN
  Administered 2020-08-22: 30 mL via ORAL
  Filled 2020-08-22: qty 30

## 2020-08-22 MED ORDER — GABAPENTIN 100 MG PO CAPS: 100.0000 mg | ORAL_CAPSULE | Freq: Three times a day (TID) | ORAL | 0 refills | Status: AC

## 2020-08-22 NOTE — Progress Notes (Signed)
Inpatient Rehab Admissions Coordinator Note:   Per therapy recommendations, pt was screened for CIR candidacy by Megan Salon, MS CCC-SLP. At this time, Pt. does not demonstrate need for CIR level therapies in two or more disciplines and lacks medical necessity for CIR admit. I will not pursue CIR admit at this time.Please contact me with questions.   Megan Salon, MS, CCC-SLP Rehab Admissions Coordinator  [phone] (celll) [phone] (office)

## 2020-08-22 NOTE — TOC Transition Note (Signed)
Transition of Care [facility]) - CM/SW Discharge Note   Patient Details  Name: Darlene Guzman MRN: [id number] Date of Birth: 10/15/1975  Transition of Care [facility] CM/SW Contact:  Glennon Mac, RN Phone Number: 08/22/2020, 11:06 AM   Clinical Narrative: Patient for discharge today, per provider and staff.  Patient plans to discharge to homeless shelter, as per previously arranged.  Patient declines outpatient referrals for therapy, as she cannot afford co-pays.  OT has made referral for possible charity physical therapy, should patient follow-up.  Patient has been provided with medication assistance, medications have been filled, and are with charge nurse.  Referral to [employer] for Rollator walker, to be delivered to bedside prior to discharge.    Final next level of care: Homeless Shelter Barriers to Discharge: Barriers Resolved   Patient Goals and CMS Choice Patient states their goals for this hospitalization and ongoing recovery are:: to go to [facility].gov Compare Post Acute Care list provided to:: Patient Choice offered to / list presented to : Patient                        Discharge Plan and Services   Discharge Planning Services: CM Consult            DME Arranged: Walker rolling with seat DME Agency: [employer] Date DME Agency Contacted: 08/22/20 Time DME Agency Contacted: [phone] Representative spoke with at DME Agency: Oletha Cruel            Social Determinants of Health (SDOH) Interventions     Readmission Risk Interventions No flowsheet data found.  Quintella Baton, RN, BSN  Trauma/Neuro ICU Case Manager [phone]

## 2020-08-22 NOTE — Discharge Summary (Signed)
Discharge Summary  Darlene Guzman YIR:[id number] DOB: Sep 07, 1976  PCP: Etta Quill, FNP  Admit date: 08/19/2020 Discharge date: 08/22/2020  Time spent: 40 mins  Recommendations for Outpatient Follow-up:  1. PCP in 1 week 2. Cardiology as needed  Discharge Diagnoses:  Active Hospital Problems   Diagnosis Date Noted  . Unstable angina ([place]) 08/20/2020  . Essential hypertension 08/20/2020  . HLD (hyperlipidemia) 08/20/2020  . Morbid obesity with BMI of 50.0-59.9, adult ([place]) 08/20/2020  . Depression   . Chronic diastolic CHF (congestive heart failure) ([place])   . DM (diabetes mellitus), type 2 with complications ([place]) 50/05/3817    Resolved Hospital Problems   Diagnosis Date Noted Date Resolved  . Chest pain 08/19/2020 08/20/2020  . Hypertensive urgency 08/19/2020 08/20/2020    Discharge Condition: Stable  Diet recommendation: Heart healthy/mod carb  Vitals:   08/22/20 0559 08/22/20 0908  BP:  112/65  Pulse: 80 98  Resp:  17  Temp:  98 F (36.7 C)  SpO2: 97%     History of present illness:  Darlene Guzman a 44 y.o.femalewith PMH DMII, HTN (uncontrolled), depression, financial constraints/homelessness who presented to the hospital with CP and pain/bubbling sensation in her stomach which was radiating up to her shoulders. She states she typically has that sensation but it has been becoming more frequent and lasting longer over the past several weeks to months. Pt has difficulty managing her blood pressure and states it usually runs high. She lives out of her car and does follow with a primary physician in [place]. On admission she also endorsed some blurry vision and symptoms of polydipsia/polyuria. She did endorse chewing tobacco (does not smoke cigarettes), denied alcohol or illicit drug use. EKG revealed chronic left bundle branch block. Troponins were checked and initial troponin was 122, was started on a heparin drip and cardiology was consulted.  She underwent heart catheterization on 08/20/2020. There was no large stenosis but notable for microvascular disease. She was recommended to continue on aspirin and Plavix for 6 months and ongoing lifestyle modification.      Today, pt denies any further chest pain, worsening shortness of breath, denies any abdominal pain, nausea/vomiting, fever/chills.  Patient advised extensively to follow-up with PCP, reports she has an appointment on 08/26/2020.  TOC team is provided patients with resources for shelter.  PT/OT has also provided patients with outpt resources and equipments.     Hospital Course:  Principal Problem:   Unstable angina (HCC) Active Problems:   DM (diabetes mellitus), type 2 with complications ([place])   Essential hypertension   HLD (hyperlipidemia)   Morbid obesity with BMI of 50.0-59.9, adult (HCC)   Depression   Chronic diastolic CHF (congestive heart failure) (HCC)   Acute coronary syndrome Possible microvascular disease ?Unstable angina Currently chest pain-free Troponin trended up, EKG shows chronic left bundle branch block Cardiology consulted, s/p heart cath on 12/9 which showed no large stenosis, shows microvascular disease Continue aspirin, Plavix for 6 months per cardiology, continue Lipitor Advised on lifestyle modification especially weight loss Follow-up with PCP, cardiology as needed  Chronic systolic and diastolic HF Appears stable Chest x-ray unremarkable, no pulmonary vascular congestion noted Echo showed EF of 45 to 50%, left ventricle demonstrates regional wall motion abnormality, grade 1 diastolic dysfunction, moderate hypokinesis Cardiology consulted, continue hydralazine, lisinopril  Hypertension BP controlled Continue hydralazine, lisinopril, amlodipine  Hyperlipidemia LDL 105 Continue statins  Diabetes mellitus type 2 with hyperglycemia A1c done on 08/19/2020 was 8.8 Continue home regimen, follow up with PCP  for further  management  Ambulatory dysfunction possibly 2/2 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 Reported issues with legs giving way, unable to stand for long periods of time CT head with no acute intracranial pathology CT lumbar/thoracic spine showed 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 Started pt on PO gabapentin, PCP may increase dose depending on patient's tolerance (patient was prescribed Cymbalta on reported oversedation, so stopped) PT/OT- encouraged outpatient PT If symptoms worsens, may need further evaluation by neurosurgery  Depression Continue amitriptyline  Tobacco abuse Reports chewing tobacco Advised to quit  Morbid obesity BMI 49 Modification advised  Homelessness TOC consulted for homelessness and medication assistance        Malnutrition Type:      Malnutrition Characteristics:      Nutrition Interventions:      Estimated body mass index is 48.59 kg/m as calculated from the following:   Height as of this encounter: 5' (1.524 m).   Weight as of this encounter: 112.9 kg.    Procedures:  Cardiac cath  Consultations:  Cardiology  Discharge Exam: BP 112/65 (BP Location: Left Arm)   Pulse 98   Temp 98 F (36.7 C) (Oral)   Resp 17   Ht 5' (1.524 m)   Wt 112.9 kg   LMP 07/29/2020 (Approximate)   SpO2 97%   BMI 48.59 kg/m   General: NAD Cardiovascular: S1, S2 present Respiratory: CTA B Neurology: No obvious focal neurologic deficits noted, strength equal in all extremities    Discharge Instructions You were cared for by a hospitalist during your hospital stay. If you have any questions about your discharge medications or the care you received while you were in the hospital after you are discharged, you can call the unit and asked to speak with the hospitalist on call if the hospitalist that took care of you is not available. Once you are discharged, your primary care physician will  handle any further medical issues. Please note that NO REFILLS for any discharge medications will be authorized once you are discharged, as it is imperative that you return to your primary care physician (or establish a relationship with a primary care physician if you do not have one) for your aftercare needs so that they can reassess your need for medications and monitor your lab values.  Discharge Instructions    Diet - low sodium heart healthy   Complete by: As directed    Increase activity slowly   Complete by: As directed      Allergies as of 08/22/2020      Reactions   Azithromycin Nausea And Vomiting   Blueberry Flavor Other (See Comments)   Allergic to blue berries- childhood allergy reaction unknown   Celexa [citalopram]    Lidocaine    Morphine And Related Hives   Raspberry Other (See Comments)   Childhood allergy reaction unknown   Novocain [procaine] Rash      Medication List    STOP taking these medications   naproxen sodium 220 MG tablet Commonly known as: ALEVE     TAKE these medications   albuterol 108 (90 Base) MCG/ACT inhaler Commonly known as: VENTOLIN HFA Inhale 2 puffs into the lungs every 6 (six) hours as needed for wheezing or shortness of breath.   amitriptyline 25 MG tablet Commonly known as: ELAVIL Take 1 tablet (25 mg total) by mouth at bedtime.   amLODipine 5 MG tablet Commonly known as: NORVASC Take 1 tablet (5 mg  total) by mouth daily.   aspirin 81 MG EC tablet Take 1 tablet (81 mg total) by mouth daily. Swallow whole.   atorvastatin 80 MG tablet Commonly known as: LIPITOR Take 1 tablet (80 mg total) by mouth daily.   blood glucose meter kit and supplies Dispense based on patient and insurance preference. Use up to four times daily as directed. (FOR ICD-10 E10.9, E11.9).   clopidogrel 75 MG tablet Commonly known as: PLAVIX Take 1 tablet (75 mg total) by mouth daily with breakfast.   furosemide 20 MG tablet Commonly known as:  LASIX Take 20 mg by mouth 2 (two) times daily.   gabapentin 100 MG capsule Commonly known as: Neurontin Take 1 capsule (100 mg total) by mouth 3 (three) times daily.   GAS-X PO Take 1 tablet by mouth daily as needed (indigestion).   hydrALAZINE 25 MG tablet Commonly known as: APRESOLINE Take 1 tablet (25 mg total) by mouth every 6 (six) hours.   lisinopril 40 MG tablet Commonly known as: ZESTRIL Take 1 tablet (40 mg total) by mouth daily.   Magnesium 125 MG Caps Take 125 mg by mouth daily.   metFORMIN 500 MG tablet Commonly known as: GLUCOPHAGE Take 1 tablet (500 mg total) by mouth 2 (two) times daily with a meal.   montelukast 10 MG tablet Commonly known as: SINGULAIR Take 1 tablet (10 mg total) by mouth daily.            Durable Medical Equipment  (From admission, onward)         Start     Ordered   08/22/20 1104  For home use only DME 4 wheeled rolling walker with seat  Once       Question:  Patient needs a walker to treat with the following condition  Answer:  Acute coronary syndrome ([place])   08/22/20 1103         Allergies  Allergen Reactions  . Azithromycin Nausea And Vomiting  . 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 Celexa [Citalopram]   . Lidocaine   . Morphine And Related Hives  . Raspberry Other (See Comments)    Childhood allergy reaction unknown  . Novocain [Procaine] Rash    Follow-up Information    Etta Quill, FNP. Schedule an appointment as soon as possible for a visit in 1 week(s).   Specialty: Internal Medicine Contact information: [place] [address] [phone]        Dixie Dials, MD Follow up.   Specialty: Cardiology Why: Follow up as needed Contact information: [place] [address] [phone]                The results of significant diagnostics from this hospitalization (including  imaging, microbiology, ancillary and laboratory) are listed below for reference.    Significant Diagnostic Studies: DG Chest 2 View  Result Date: 08/19/2020 CLINICAL DATA:  Shortness of breath. EXAM: CHEST - 2 VIEW COMPARISON:  April 01, 2020 FINDINGS: The heart size and mediastinal contours are within normal limits. Both lungs are clear. The visualized skeletal structures are unremarkable. IMPRESSION: No active cardiopulmonary disease. Electronically Signed   By: Virgina Norfolk M.D.   On: 08/19/2020 16:36   CT Head Wo Contrast  Result Date: 08/19/2020 CLINICAL DATA:  [place]ipl[place]. EXAM: CT HEAD WITHOUT CONTRAST TECHNIQUE: Contiguous axial images were obtained from the base of the skull through the vertex without  intravenous contrast. COMPARISON:  June 02, 2007 FINDINGS: Brain: No evidence of acute infarction, hemorrhage, hydrocephalus, extra-axial collection or mass lesion/mass effect. Vascular: No hyperdense vessel or unexpected calcification. Skull: Normal. Negative for fracture or focal lesion. Sinuses/Orbits: No acute finding. Other: None. IMPRESSION: No acute intracranial pathology. Electronically Signed   By: Virgina Norfolk M.D.   On: 08/19/2020 16:35   CT THORACIC SPINE WO CONTRAST  Result Date: 08/21/2020 CLINICAL DATA:  44 year old female with mid back pain, loss of feeling in the bilateral legs. Recent shortness of breath, blurred vision, uncontrolled diabetes. EXAM: CT THORACIC AND LUMBAR SPINE WITHOUT CONTRAST TECHNIQUE: Multidetector CT imaging of the thoracic and lumbar spine was performed without contrast. Multiplanar CT image reconstructions were also generated. COMPARISON:  Thoracic and lumbar spine radiographs 05/24/2013. FINDINGS: CT THORACIC SPINE FINDINGS Limited cervical spine imaging: Cervicothoracic junction alignment is within normal limits. Thoracic spine segmentation:  Appears to be normal. Alignment: Stable thoracic kyphosis since 2014. No spondylolisthesis. No  scoliosis. Vertebrae: Bone mineralization is within normal limits. Thoracic vertebrae appear intact. Visible posterior ribs appear intact. No acute osseous abnormality identified. Paraspinal and other soft tissues: Mild cardiomegaly. No pericardial or pleural effusion. Mild respiratory motion in the lungs which seem to remain clear. Evidence of hepatic steatosis in the visible upper abdomen. Negative thoracic paraspinal soft tissues. Disc levels: Mid and lower thoracic mild degenerative endplate spurring. Capacious appearance of the thoracic spinal canal. No CT evidence of thoracic disc herniation or spinal stenosis. CT LUMBAR SPINE FINDINGS Segmentation: Normal, concordant with the thoracic numbering. Alignment: Stable lumbar lordosis since 2014. No spondylolisthesis. No significant lumbar scoliosis. Vertebrae: Background bone mineralization is within normal limits. Intact lumbar vertebrae. Intact visible sacrum and SI joints. No acute osseous abnormality identified. Paraspinal and other soft tissues: Hepatic steatosis. Negative visible other noncontrast abdominal viscera. Minimal calcified atherosclerosis of the abdominal aorta. Lumbar paraspinal soft tissues are within normal limits. Disc levels: Mild degeneration from the thoracolumbar junction through L2-L3. L3-L4: Mild to moderate facet hypertrophy greater on the left. No significant stenosis. L4-L5: Mild disc space loss. Broad-based partially calcified posterior disc protrusion (series 13, image 85). Moderate facet and ligament flavum hypertrophy. At least mild spinal and moderate lateral recess stenosis greater on the right. Mild if any L4 foraminal stenosis. L5-S1: Disc space loss. Vacuum disc. Circumferential disc osteophyte complex with left paracentral component (series 13, image 98). Moderate facet hypertrophy. However, no significant spinal stenosis. Mild to moderate left lateral recess stenosis (left S1 nerve level). Severe left and moderate to severe  right L5 foraminal stenosis. IMPRESSION: CT THORACIC SPINE IMPRESSION 1. Negative for age CT appearance of the thoracic spine. 2. Mild cardiomegaly. CT LUMBAR SPINE IMPRESSION 1.  No acute osseous abnormality in the lumbar spine. 2. Lower lumbar disc and posterior element degeneration resulting in: - L4-L5 mild to moderate spinal and lateral recess stenosis, greater on the right (right L5 nerve level). - L5-S1 mild to moderate left lateral recess and severe foraminal stenosis greater on the left (left L5 and S1 nerve levels). 3. Hepatic steatosis. Electronically Signed   By: Genevie Ann M.D.   On: 08/21/2020 17:59   CT LUMBAR SPINE WO CONTRAST  Result Date: 08/21/2020 CLINICAL DATA:  44 year old female with mid back pain, loss of feeling in the bilateral legs. Recent shortness of breath, blurred vision, uncontrolled diabetes. EXAM: CT THORACIC AND LUMBAR SPINE WITHOUT CONTRAST TECHNIQUE: Multidetector CT imaging of the thoracic and lumbar spine was performed without contrast. Multiplanar CT image reconstructions were also  generated. COMPARISON:  Thoracic and lumbar spine radiographs 05/24/2013. FINDINGS: CT THORACIC SPINE FINDINGS Limited cervical spine imaging: Cervicothoracic junction alignment is within normal limits. Thoracic spine segmentation:  Appears to be normal. Alignment: Stable thoracic kyphosis since 2014. No spondylolisthesis. No scoliosis. Vertebrae: Bone mineralization is within normal limits. Thoracic vertebrae appear intact. Visible posterior ribs appear intact. No acute osseous abnormality identified. Paraspinal and other soft tissues: Mild cardiomegaly. No pericardial or pleural effusion. Mild respiratory motion in the lungs which seem to remain clear. Evidence of hepatic steatosis in the visible upper abdomen. Negative thoracic paraspinal soft tissues. Disc levels: Mid and lower thoracic mild degenerative endplate spurring. Capacious appearance of the thoracic spinal canal. No CT evidence of  thoracic disc herniation or spinal stenosis. CT LUMBAR SPINE FINDINGS Segmentation: Normal, concordant with the thoracic numbering. Alignment: Stable lumbar lordosis since 2014. No spondylolisthesis. No significant lumbar scoliosis. Vertebrae: Background bone mineralization is within normal limits. Intact lumbar vertebrae. Intact visible sacrum and SI joints. No acute osseous abnormality identified. Paraspinal and other soft tissues: Hepatic steatosis. Negative visible other noncontrast abdominal viscera. Minimal calcified atherosclerosis of the abdominal aorta. Lumbar paraspinal soft tissues are within normal limits. Disc levels: Mild degeneration from the thoracolumbar junction through L2-L3. L3-L4: Mild to moderate facet hypertrophy greater on the left. No significant stenosis. L4-L5: Mild disc space loss. Broad-based partially calcified posterior disc protrusion (series 13, image 85). Moderate facet and ligament flavum hypertrophy. At least mild spinal and moderate lateral recess stenosis greater on the right. Mild if any L4 foraminal stenosis. L5-S1: Disc space loss. Vacuum disc. Circumferential disc osteophyte complex with left paracentral component (series 13, image 98). Moderate facet hypertrophy. However, no significant spinal stenosis. Mild to moderate left lateral recess stenosis (left S1 nerve level). Severe left and moderate to severe right L5 foraminal stenosis. IMPRESSION: CT THORACIC SPINE IMPRESSION 1. Negative for age CT appearance of the thoracic spine. 2. Mild cardiomegaly. CT LUMBAR SPINE IMPRESSION 1.  No acute osseous abnormality in the lumbar spine. 2. Lower lumbar disc and posterior element degeneration resulting in: - L4-L5 mild to moderate spinal and lateral recess stenosis, greater on the right (right L5 nerve level). - L5-S1 mild to moderate left lateral recess and severe foraminal stenosis greater on the left (left L5 and S1 nerve levels). 3. Hepatic steatosis. Electronically Signed    By: Genevie Ann M.D.   On: 08/21/2020 17:59   CARDIAC CATHETERIZATION  Result Date: 08/20/2020 Aspirin, Clopidogrel for 6 months for suspected microvascular disease. Life-style modification with heart healthy diet and activity. Improve diabetic control.   ECHOCARDIOGRAM COMPLETE  Result Date: 08/20/2020    ECHOCARDIOGRAM REPORT   Patient Name:   BRYNLYN DADE Date of Exam: 08/20/2020 Medical Rec #:  [id number]       Height:       60.0 in Accession #:    [id number]      Weight:       230.0 lb Date of Birth:  01-Mar-1976       BSA:          1.981 m Patient Age:    60 years        BP:           149/96 mmHg Patient Gender: F               HR:           80 bpm. Exam Location:  Inpatient Procedure: 2D Echo, Cardiac Doppler, Color Doppler and Intracardiac  Opacification Agent Indications:     R07.9* Chest pain, unspecified. Elevated troponin.  History:         Patient has no prior history of Echocardiogram examinations.                  Risk Factors:Diabetes.  Sonographer:     Roseanna Rainbow RDCS Referring Phys:  [place] Diagnosing Phys: Dixie Dials MD IMPRESSIONS  1. Left ventricular ejection fraction, by estimation, is 45 to 50%. The left ventricle has mildly decreased function. The left ventricle demonstrates regional wall motion abnormalities (see scoring diagram/findings for description). There is mild concentric left ventricular hypertrophy. Left ventricular diastolic parameters are consistent with Grade I diastolic dysfunction (impaired relaxation). There is moderate hypokinesis of the left ventricular, basal-mid anterior wall. There is mild paradoxical of the left ventricular, entire septal wall.  2. Right ventricular systolic function is low normal. The right ventricular size is normal.  3. Left atrial size was mildly dilated.  4. The mitral valve is normal in structure. Trivial mitral valve regurgitation.  5. The aortic valve is tricuspid. Aortic valve regurgitation is not visualized.  Mild aortic valve sclerosis is present, with no evidence of aortic valve stenosis.  6. There is mild (Grade II) atheroma plaque involving the aortic root.  7. The inferior vena cava is normal in size with <50% respiratory variability, suggesting right atrial pressure of 8 mmHg. FINDINGS  Left Ventricle: Left ventricular ejection fraction, by estimation, is 45 to 50%. The left ventricle has mildly decreased function. The left ventricle demonstrates regional wall motion abnormalities. Moderate hypokinesis of the left ventricular, basal-mid anterior wall. Mild paradoxical of the left ventricular, entire septal wall. Definity contrast agent was given IV to delineate the left ventricular endocardial borders. The left ventricular internal cavity size was normal in size. There is mild  concentric left ventricular hypertrophy. Left ventricular diastolic parameters are consistent with Grade I diastolic dysfunction (impaired relaxation). Right Ventricle: The right ventricular size is normal. No increase in right ventricular wall thickness. Right ventricular systolic function is low normal. Left Atrium: Left atrial size was mildly dilated. Right Atrium: Right atrial size was normal in size. Pericardium: There is no evidence of pericardial effusion. Mitral Valve: The mitral valve is normal in structure. There is moderate calcification of the anterior mitral valve leaflet(s). Mild to moderate mitral annular calcification. Trivial mitral valve regurgitation. Tricuspid Valve: The tricuspid valve is normal in structure. Tricuspid valve regurgitation is trivial. Aortic Valve: The aortic valve is tricuspid. Aortic valve regurgitation is not visualized. Mild aortic valve sclerosis is present, with no evidence of aortic valve stenosis. Pulmonic Valve: The pulmonic valve was normal in structure. Pulmonic valve regurgitation is not visualized. Aorta: The aortic root is normal in size and structure. There is mild (Grade II) atheroma  plaque involving the aortic root. Venous: The inferior vena cava is normal in size with less than 50% respiratory variability, suggesting right atrial pressure of 8 mmHg. IAS/Shunts: The interatrial septum was not assessed.  LEFT VENTRICLE PLAX 2D LVIDd:         4.10 cm      Diastology LVIDs:         2.50 cm      LV e' medial:    5.11 cm/s LV PW:         1.40 cm      LV E/e' medial:  21.0 LV IVS:        1.30 cm      LV  e' lateral:   9.46 cm/s LVOT diam:     1.90 cm      LV E/e' lateral: 11.4 LV SV:         63 LV SV Index:   32 LVOT Area:     2.84 cm  LV Volumes (MOD) LV vol d, MOD A2C: 137.0 ml LV vol d, MOD A4C: 114.0 ml LV vol s, MOD A2C: 72.0 ml LV vol s, MOD A4C: 70.7 ml LV SV MOD A2C:     65.0 ml LV SV MOD A4C:     114.0 ml LV SV MOD BP:      54.7 ml RIGHT VENTRICLE RV S prime:     14.80 cm/s TAPSE (M-mode): 2.5 cm LEFT ATRIUM             Index       RIGHT ATRIUM           Index LA diam:        3.70 cm 1.87 cm/m  RA Area:     10.10 cm LA Vol (A2C):   30.2 ml 15.25 ml/m RA Volume:   22.70 ml  11.46 ml/m LA Vol (A4C):   54.8 ml 27.67 ml/m LA Biplane Vol: 43.4 ml 21.91 ml/m  AORTIC VALVE LVOT Vmax:   107.00 cm/s LVOT Vmean:  80.600 cm/s LVOT VTI:    0.221 m  AORTA Ao Root diam: 3.30 cm Ao Asc diam:  2.90 cm MITRAL VALVE MV Area (PHT): 5.75 cm     SHUNTS MV Decel Time: 132 msec     Systemic VTI:  0.22 m MV E velocity: 107.50 cm/s  Systemic Diam: 1.90 cm MV A velocity: 114.00 cm/s MV E/A ratio:  0.94 Dixie Dials MD Electronically signed by Dixie Dials MD Signature Date/Time: 08/20/2020/9:41:11 AM    Final     Microbiology: Recent Results (from the past 240 hour(s))  Urine culture     Status: Abnormal   Collection Time: 08/19/20  3:06 PM   Specimen: Urine, Random  Result Value Ref Range Status   Specimen Description   Final    URINE, RANDOM Performed at [facility], [place] [address] [place], [place] [address]    Special Requests   Final    NONE Performed at [facility], [place] [address], [place], [place] [address]    Culture (A)  Final    <10,000 COLONIES/mL INSIGNIFICANT GROWTH Performed at [facility] [address], [place], [place] [address]    Report Status 08/20/2020 FINAL  Final  Resp Panel by RT-PCR (Flu A&B, Covid) Nasopharyngeal Swab     Status: None   Collection Time: 08/19/20  6:50 PM   Specimen: Nasopharyngeal Swab; Nasopharyngeal(NP) swabs in vial transport medium  Result Value Ref Range Status   SARS Coronavirus 2 by RT PCR NEGATIVE NEGATIVE Final    Comment: (NOTE) SARS-CoV-2 target nucleic acids are NOT DETECTED.  The SARS-CoV-2 RNA is generally detectable in upper respiratory specimens during the acute phase of infection. The lowest concentration of SARS-CoV-2 viral copies this assay can detect is 138 copies/mL. A negative result does not preclude SARS-Cov-2 infection and should not be used as the sole basis for treatment or other patient management decisions. A negative result may occur with  improper specimen collection/handling, submission of specimen other than nasopharyngeal swab, presence of viral mutation(s) within the areas targeted by this assay, and inadequate number of viral copies(<138 copies/mL). A negative result must be combined  with clinical observations, patient history, and epidemiological information. The expected result is Negative.  Fact Sheet for Patients:  EntrepreneurPulse.com.au  Fact Sheet for Healthcare Providers:  IncredibleEmployment.be  This test is no t yet approved or cleared by the [place] FDA and  has been authorized for detection and/or diagnosis of SARS-CoV-2 by FDA under an Emergency Use Authorization (EUA). This EUA will remain  in effect (meaning this test can be used) for the duration of the COVID-19 declaration under Section 564(b)(1) of the Act, 21 U.S.C.section 360bbb-3(b)(1), unless the authorization is terminated   or revoked sooner.       Influenza A by PCR NEGATIVE NEGATIVE Final   Influenza B by PCR NEGATIVE NEGATIVE Final    Comment: (NOTE) The Xpert Xpress SARS-CoV-2/FLU/RSV plus assay is intended as an aid in the diagnosis of influenza from Nasopharyngeal swab specimens and should not be used as a sole basis for treatment. Nasal washings and aspirates are unacceptable for Xpert Xpress SARS-CoV-2/FLU/RSV testing.  Fact Sheet for Patients: EntrepreneurPulse.com.au  Fact Sheet for Healthcare Providers: IncredibleEmployment.be  This test is not yet approved or cleared by the [place] FDA and has been authorized for detection and/or diagnosis of SARS-CoV-2 by FDA under an Emergency Use Authorization (EUA). This EUA will remain in effect (meaning this test can be used) for the duration of the COVID-19 declaration under Section 564(b)(1) of the Act, 21 U.S.C. section 360bbb-3(b)(1), unless the authorization is terminated or revoked.  Performed at [facility], [place] [address], [place], [place] [address]      Labs: Basic Metabolic Panel: Recent Labs  Lab 08/19/20 1509 08/20/20 0318 08/21/20 0040  NA 132* 133* 133*  K 4.1 3.6 4.2  CL 99 101 99  CO2 _0 GLUCOSE 366* 214* 152*  BUN _1 CREATININE 0.86 0.71 0.78  CALCIUM 9.1 9.3 9.1  MG 1.9 1.8  --   PHOS  --  3.1  --    Liver Function Tests: Recent Labs  Lab 08/19/20 1530 08/20/20 0318  AST 23 19  ALT 22 21  ALKPHOS 61 55  BILITOT 0.6 0.5  PROT 7.3 6.8  ALBUMIN 4.0 3.8   Recent Labs  Lab 08/19/20 1530  LIPASE 33   No results for input(s): AMMONIA in the last 168 hours. CBC: Recent Labs  Lab 08/19/20 1509 08/20/20 0436 08/20/20 1451 08/21/20 0040  WBC 8.2 9.8 6.6 7.8  NEUTROABS  --  5.6 3.8  --   HGB 13.5 12.8 12.5 11.2*  HCT 40.7 39.7 39.0 35.0*  MCV 83.4 85.0 84.2 83.9  PLT 272 278 213 217   Cardiac Enzymes: No results for  input(s): CKTOTAL, CKMB, CKMBINDEX, TROPONINI in the last 168 hours. BNP: BNP (last 3 results) Recent Labs    08/19/20 1509  BNP 51.5    ProBNP (last 3 results) No results for input(s): PROBNP in the last 8760 hours.  CBG: Recent Labs  Lab 08/21/20 1122 08/21/20 1701 08/21/20 2117 08/22/20 0735 08/22/20 1232  GLUCAP 256* 200* 208* 144* 281*       Signed:  Alma Friendly, MD [facility] 08/22/2020, 12:43 PM

## 2020-08-22 NOTE — Consult Note (Signed)
Ref: Eyvonne Mechanic, FNP   Subjective:  Awake. Ambulated with walker. No chest pain. No right groin discomfort or swelling.  Blood pressure remains normal.  Objective:  Vital Signs in the last 24 hours: Temp:  [97.8 F (36.6 C)-98 F (36.7 C)] 98 F (36.7 C) (12/11 0908) Pulse Rate:  [80-98] 98 (12/11 0908) Cardiac Rhythm: Normal sinus rhythm (12/11 0800) Resp:  [17-20] 17 (12/11 0908) BP: (112-145)/(64-104) 112/65 (12/11 0908) SpO2:  [97 %] 97 % (12/11 0559) Weight:  [112.9 kg] 112.9 kg (12/11 0601)  Physical Exam: BP Readings from Last 1 Encounters:  08/22/20 112/65     Wt Readings from Last 1 Encounters:  08/22/20 112.9 kg    Weight change: -4.627 kg Body mass index is 48.59 kg/m. HEENT: [place]/AT, Eyes-Hazel, Conjunctiva-Pink, Sclera-Non-icteric Neck: No JVD, No bruit, Trachea midline. Lungs:  Clear, Bilateral. Cardiac:  Regular rhythm, normal S1 and S2, no S3. II/VI systolic murmur. Abdomen:  Soft, non-tender. BS present. Extremities:  No edema present. No cyanosis. No clubbing. CNS: AxOx3, Cranial nerves grossly intact, moves all 4 extremities.  Skin: Warm and dry.   Intake/Output from previous day: 12/10 0701 - 12/11 0700 In: 363 [P.O.:360; I.V.:3] Out: -     Lab Results: BMET    Component Value Date/Time   NA 133 (L) 08/21/2020 0040   NA 133 (L) 08/20/2020 0318   NA 132 (L) 08/19/2020 1509   NA 136 07/18/2013 0431   K 4.2 08/21/2020 0040   K 3.6 08/20/2020 0318   K 4.1 08/19/2020 1509   K 3.7 07/18/2013 0431   CL 99 08/21/2020 0040   CL 101 08/20/2020 0318   CL 99 08/19/2020 1509   CL 106 07/18/2013 0431   CO2 24 08/21/2020 0040   CO2 22 08/20/2020 0318   CO2 22 08/19/2020 1509   CO2 25 07/18/2013 0431   GLUCOSE 152 (H) 08/21/2020 0040   GLUCOSE 214 (H) 08/20/2020 0318   GLUCOSE 366 (H) 08/19/2020 1509   GLUCOSE 105 (H) 07/18/2013 0431   BUN 12 08/21/2020 0040   BUN 13 08/20/2020 0318   BUN 13 08/19/2020 1509   BUN 17 07/18/2013 0431    CREATININE 0.78 08/21/2020 0040   CREATININE 0.71 08/20/2020 0318   CREATININE 0.86 08/19/2020 1509   CREATININE 0.85 07/18/2013 0431   CALCIUM 9.1 08/21/2020 0040   CALCIUM 9.3 08/20/2020 0318   CALCIUM 9.1 08/19/2020 1509   CALCIUM 9.0 07/18/2013 0431   GFRNONAA >60 08/21/2020 0040   GFRNONAA >60 08/20/2020 0318   GFRNONAA >60 08/19/2020 1509   GFRNONAA >60 07/18/2013 0431   GFRAA >60 04/01/2020 0117   GFRAA >60 09/28/2019 2234   GFRAA >60 09/19/2019 1613   GFRAA >60 07/18/2013 0431   CBC    Component Value Date/Time   WBC 7.8 08/21/2020 0040   RBC 4.17 08/21/2020 0040   HGB 11.2 (L) 08/21/2020 0040   HCT 35.0 (L) 08/21/2020 0040   PLT 217 08/21/2020 0040   MCV 83.9 08/21/2020 0040   MCH 26.9 08/21/2020 0040   MCHC 32.0 08/21/2020 0040   RDW 14.7 08/21/2020 0040   LYMPHSABS 2.0 08/20/2020 1451   MONOABS 0.6 08/20/2020 1451   EOSABS 0.1 08/20/2020 1451   BASOSABS 0.0 08/20/2020 1451   HEPATIC Function Panel Recent Labs    09/28/19 2234 08/19/20 1530 08/20/20 0318  PROT 7.5 7.3 6.8   HEMOGLOBIN A1C No components found for: HGA1C,  MPG CARDIAC ENZYMES Lab Results  Component Value Date  CKTOTAL 105 07/18/2013   TROPONINI <0.03 03/02/2019   TROPONINI <0.30 09/30/2013   BNP No results for input(s): PROBNP in the last 8760 hours. TSH Recent Labs    08/20/20 0436  TSH 2.567   CHOLESTEROL Recent Labs    08/20/20 0436  CHOL 206*    Scheduled Meds: . amitriptyline  25 mg Oral QHS  . amLODipine  5 mg Oral Daily  . aspirin EC  81 mg Oral Daily  . atorvastatin  80 mg Oral Daily  . clopidogrel  75 mg Oral Q breakfast  . DULoxetine  60 mg Oral Daily  . enoxaparin (LOVENOX) injection  40 mg Subcutaneous Q24H  . insulin aspart  0-5 Units Subcutaneous QHS  . insulin aspart  0-9 Units Subcutaneous TID WC  . lisinopril  40 mg Oral Daily  . nicotine  21 mg Transdermal Daily  . nitroGLYCERIN      . sodium chloride flush  3 mL Intravenous Q12H    Continuous Infusions: . sodium chloride     PRN Meds:.sodium chloride, acetaminophen **OR** acetaminophen, albuterol, alum & mag hydroxide-simeth, hydrOXYzine, ipratropium-albuterol, ondansetron (ZOFRAN) IV, sodium chloride flush  Assessment/Plan: Acute coronary syndrome Possible microvascular disease Type 2 DM with hyperglycemia Morbid obesity  May discharge from cardiology. Patient has physician in [place] for follow up. She understood need for diet, activity and medications compliance. She will see me as needed.   LOS: 2 days   Time spent including chart review, lab review, examination, discussion with patient/Nurse : 30 min   Orpah Cobb  MD  08/22/2020, 9:49 AM

## 2020-08-22 NOTE — Plan of Care (Signed)
  Problem: Education: Goal: Understanding of CV disease, CV risk reduction, and recovery process will improve Outcome: Progressing Goal: Individualized Educational Video(s) Outcome: Progressing   Problem: Activity: Goal: Ability to return to baseline activity level will improve Outcome: Progressing   Problem: Cardiovascular: Goal: Ability to achieve and maintain adequate cardiovascular perfusion will improve Outcome: Progressing Goal: Vascular access site(s) Level 0-1 will be maintained Outcome: Progressing   Problem: Health Behavior/Discharge Planning: Goal: Ability to safely manage health-related needs after discharge will improve Outcome: Progressing   Problem: Education: Goal: Knowledge of General Education information will improve Description: Including pain rating scale, medication(s)/side effects and non-pharmacologic comfort measures Outcome: Progressing   Problem: Health Behavior/Discharge Planning: Goal: Ability to manage health-related needs will improve Outcome: Progressing   Problem: Clinical Measurements: Goal: Ability to maintain clinical measurements within normal limits will improve Outcome: Progressing Goal: Will remain free from infection Outcome: Progressing Goal: Diagnostic test results will improve Outcome: Progressing Goal: Respiratory complications will improve Outcome: Progressing Goal: Cardiovascular complication will be avoided Outcome: Progressing   Problem: Activity: Goal: Risk for activity intolerance will decrease Outcome: Progressing   Problem: Nutrition: Goal: Adequate nutrition will be maintained Outcome: Progressing   Problem: Coping: Goal: Level of anxiety will decrease Outcome: Progressing   Problem: Elimination: Goal: Will not experience complications related to bowel motility Outcome: Progressing Goal: Will not experience complications related to urinary retention Outcome: Progressing   Problem: Pain Managment: Goal:  General experience of comfort will improve Outcome: Progressing   Problem: Safety: Goal: Ability to remain free from injury will improve Outcome: Progressing   Problem: Skin Integrity: Goal: Risk for impaired skin integrity will decrease Outcome: Progressing   Problem: Education: Goal: Ability to describe self-care measures that may prevent or decrease complications (Diabetes Survival Skills Education) will improve Outcome: Progressing Goal: Individualized Educational Video(s) Outcome: Progressing   Problem: Coping: Goal: Ability to adjust to condition or change in health will improve Outcome: Progressing   Problem: Fluid Volume: Goal: Ability to maintain a balanced intake and output will improve Outcome: Progressing   Problem: Health Behavior/Discharge Planning: Goal: Ability to identify and utilize available resources and services will improve Outcome: Progressing Goal: Ability to manage health-related needs will improve Outcome: Progressing   Problem: Metabolic: Goal: Ability to maintain appropriate glucose levels will improve Outcome: Progressing   Problem: Nutritional: Goal: Maintenance of adequate nutrition will improve Outcome: Progressing Goal: Progress toward achieving an optimal weight will improve Outcome: Progressing   Problem: Skin Integrity: Goal: Risk for impaired skin integrity will decrease Outcome: Progressing   Problem: Tissue Perfusion: Goal: Adequacy of tissue perfusion will improve Outcome: Progressing   

## 2020-08-22 NOTE — Plan of Care (Signed)
Problem: Education: Goal: Understanding of CV disease, CV risk reduction, and recovery process will improve 08/22/2020 1241 by Durward Fortes, RN Outcome: Adequate for Discharge 08/22/2020 0728 by Durward Fortes, RN Outcome: Progressing Goal: Individualized Educational Video(s) 08/22/2020 1241 by Durward Fortes, RN Outcome: Adequate for Discharge 08/22/2020 0728 by Durward Fortes, RN Outcome: Progressing   Problem: Activity: Goal: Ability to return to baseline activity level will improve 08/22/2020 1241 by Durward Fortes, RN Outcome: Adequate for Discharge 08/22/2020 0728 by Durward Fortes, RN Outcome: Progressing   Problem: Cardiovascular: Goal: Ability to achieve and maintain adequate cardiovascular perfusion will improve 08/22/2020 1241 by Durward Fortes, RN Outcome: Adequate for Discharge 08/22/2020 0728 by Durward Fortes, RN Outcome: Progressing Goal: Vascular access site(s) Level 0-1 will be maintained 08/22/2020 1241 by Durward Fortes, RN Outcome: Adequate for Discharge 08/22/2020 0728 by Durward Fortes, RN Outcome: Progressing   Problem: Health Behavior/Discharge Planning: Goal: Ability to safely manage health-related needs after discharge will improve 08/22/2020 1241 by Durward Fortes, RN Outcome: Adequate for Discharge 08/22/2020 0728 by Durward Fortes, RN Outcome: Progressing   Problem: Education: Goal: Knowledge of General Education information will improve Description: Including pain rating scale, medication(s)/side effects and non-pharmacologic comfort measures 08/22/2020 1241 by Durward Fortes, RN Outcome: Adequate for Discharge 08/22/2020 0728 by Durward Fortes, RN Outcome: Progressing   Problem: Health Behavior/Discharge Planning: Goal: Ability to manage health-related needs will improve 08/22/2020 1241 by Durward Fortes, RN Outcome: Adequate for Discharge 08/22/2020 0728 by Durward Fortes, RN Outcome: Progressing   Problem: Clinical  Measurements: Goal: Ability to maintain clinical measurements within normal limits will improve 08/22/2020 1241 by Durward Fortes, RN Outcome: Adequate for Discharge 08/22/2020 0728 by Durward Fortes, RN Outcome: Progressing Goal: Will remain free from infection 08/22/2020 1241 by Durward Fortes, RN Outcome: Adequate for Discharge 08/22/2020 0728 by Durward Fortes, RN Outcome: Progressing Goal: Diagnostic test results will improve 08/22/2020 1241 by Durward Fortes, RN Outcome: Adequate for Discharge 08/22/2020 0728 by Durward Fortes, RN Outcome: Progressing Goal: Respiratory complications will improve 08/22/2020 1241 by Durward Fortes, RN Outcome: Adequate for Discharge 08/22/2020 0728 by Durward Fortes, RN Outcome: Progressing Goal: Cardiovascular complication will be avoided 08/22/2020 1241 by Durward Fortes, RN Outcome: Adequate for Discharge 08/22/2020 0728 by Durward Fortes, RN Outcome: Progressing   Problem: Activity: Goal: Risk for activity intolerance will decrease 08/22/2020 1241 by Durward Fortes, RN Outcome: Adequate for Discharge 08/22/2020 0728 by Durward Fortes, RN Outcome: Progressing   Problem: Nutrition: Goal: Adequate nutrition will be maintained 08/22/2020 1241 by Durward Fortes, RN Outcome: Adequate for Discharge 08/22/2020 0728 by Durward Fortes, RN Outcome: Progressing   Problem: Coping: Goal: Level of anxiety will decrease 08/22/2020 1241 by Durward Fortes, RN Outcome: Adequate for Discharge 08/22/2020 0728 by Durward Fortes, RN Outcome: Progressing   Problem: Elimination: Goal: Will not experience complications related to bowel motility 08/22/2020 1241 by Durward Fortes, RN Outcome: Adequate for Discharge 08/22/2020 0728 by Durward Fortes, RN Outcome: Progressing Goal: Will not experience complications related to urinary retention 08/22/2020 1241 by Durward Fortes, RN Outcome: Adequate for Discharge 08/22/2020 0728 by Durward Fortes,  RN Outcome: Progressing   Problem: Pain Managment: Goal: General experience of comfort will improve 08/22/2020 1241 by Durward Fortes, RN Outcome: Adequate for Discharge 08/22/2020 0728 by Durward Fortes, RN Outcome: Progressing   Problem:  Safety: Goal: Ability to remain free from injury will improve 08/22/2020 1241 by Durward Fortes, RN Outcome: Adequate for Discharge 08/22/2020 0728 by Durward Fortes, RN Outcome: Progressing   Problem: Skin Integrity: Goal: Risk for impaired skin integrity will decrease 08/22/2020 1241 by Durward Fortes, RN Outcome: Adequate for Discharge 08/22/2020 0728 by Durward Fortes, RN Outcome: Progressing   Problem: Education: Goal: Ability to describe self-care measures that may prevent or decrease complications (Diabetes Survival Skills Education) will improve 08/22/2020 1241 by Durward Fortes, RN Outcome: Adequate for Discharge 08/22/2020 0728 by Durward Fortes, RN Outcome: Progressing Goal: Individualized Educational Video(s) 08/22/2020 1241 by Durward Fortes, RN Outcome: Adequate for Discharge 08/22/2020 0728 by Durward Fortes, RN Outcome: Progressing   Problem: Coping: Goal: Ability to adjust to condition or change in health will improve 08/22/2020 1241 by Durward Fortes, RN Outcome: Adequate for Discharge 08/22/2020 0728 by Durward Fortes, RN Outcome: Progressing   Problem: Fluid Volume: Goal: Ability to maintain a balanced intake and output will improve 08/22/2020 1241 by Durward Fortes, RN Outcome: Adequate for Discharge 08/22/2020 0728 by Durward Fortes, RN Outcome: Progressing   Problem: Health Behavior/Discharge Planning: Goal: Ability to identify and utilize available resources and services will improve 08/22/2020 1241 by Durward Fortes, RN Outcome: Adequate for Discharge 08/22/2020 0728 by Durward Fortes, RN Outcome: Progressing Goal: Ability to manage health-related needs will improve 08/22/2020 1241 by Durward Fortes,  RN Outcome: Adequate for Discharge 08/22/2020 0728 by Durward Fortes, RN Outcome: Progressing   Problem: Metabolic: Goal: Ability to maintain appropriate glucose levels will improve 08/22/2020 1241 by Durward Fortes, RN Outcome: Adequate for Discharge 08/22/2020 0728 by Durward Fortes, RN Outcome: Progressing   Problem: Nutritional: Goal: Maintenance of adequate nutrition will improve 08/22/2020 1241 by Durward Fortes, RN Outcome: Adequate for Discharge 08/22/2020 0728 by Durward Fortes, RN Outcome: Progressing Goal: Progress toward achieving an optimal weight will improve 08/22/2020 1241 by Durward Fortes, RN Outcome: Adequate for Discharge 08/22/2020 0728 by Durward Fortes, RN Outcome: Progressing   Problem: Skin Integrity: Goal: Risk for impaired skin integrity will decrease 08/22/2020 1241 by Durward Fortes, RN Outcome: Adequate for Discharge 08/22/2020 0728 by Durward Fortes, RN Outcome: Progressing   Problem: Tissue Perfusion: Goal: Adequacy of tissue perfusion will improve 08/22/2020 1241 by Durward Fortes, RN Outcome: Adequate for Discharge 08/22/2020 0728 by Durward Fortes, RN Outcome: Progressing

## 2020-08-22 NOTE — Progress Notes (Signed)
Occupational Therapy Treatment Patient Details Name: Darlene Guzman MRN: [id number] DOB: [birth year] Today's Date: 08/22/2020    History of present illness 44 yo female presenting with blurry vision and chest pains with elevated troppnin. COVID test negative; received vaccine last October. EKG revealed chronic left bundle branch block. Underwent heart catheterization on 08/20/2020. PMH including DM type 2, HTN, and depression.   OT comments  Pt seen for OT follow up session with focus on ADL mobility progression and safety. Pt was issued long handled sponge and reacher to improve LB ADL independence with dressing and bathing. She was also given a full body HEP with level 3 theraband to improve strength and activity tolerance to improve engagement in ADL routine. Pt reports stressors of losing her job due to weakness, as well as living out her car. Educated pt on [facility] (pro bono therapy services out of [place]) and provided her with contact information. Continue to recommend OP OT. Will continue to follow per POC listed below.   Follow Up Recommendations  Outpatient OT;Other (comment) ([facility])    Equipment Recommendations  Other (comment) Aeronautical engineer)    Recommendations for Other Services      Precautions / Restrictions Precautions Precautions: Fall Restrictions Weight Bearing Restrictions: No       Mobility Bed Mobility Overal bed mobility: Modified Independent                Transfers Overall transfer level: Needs assistance Equipment used: Rolling walker (2 wheeled) Transfers: Sit to/from Stand Sit to Stand: Supervision              Balance Overall balance assessment: Mild deficits observed, not formally tested                                         ADL either performed or assessed with clinical judgement   ADL Overall ADL's : Needs assistance/impaired                                       General ADL  Comments: Issued pt long handled reacher and long handled sponge to improve LB ADL independence. Also reviewed full body HEP to improve activity tolerance for increased independence in daily routines     Vision Patient Visual Report: No change from baseline     Perception     Praxis      Cognition Arousal/Alertness: Awake/alert Behavior During Therapy: WFL for tasks assessed/performed Overall Cognitive Status: Within Functional Limits for tasks assessed                                          Exercises Other Exercises Other Exercises: Given full body HEP with level 3 theraband   Shoulder Instructions       General Comments      Pertinent Vitals/ Pain       Pain Assessment: No/denies pain  Home Living                                          Prior Functioning/Environment  Frequency  Min 2X/week        Progress Toward Goals  OT Goals(current goals can now be found in the care plan section)  Progress towards OT goals: Progressing toward goals  Acute Rehab OT Goals Patient Stated Goal: return to work OT Goal Formulation: With patient Time For Goal Achievement: 09/04/20 Potential to Achieve Goals: Good  Plan Discharge plan remains appropriate    Co-evaluation                 AM-PAC OT "6 Clicks" Daily Activity     Outcome Measure   Help from another person eating meals?: None Help from another person taking care of personal grooming?: None Help from another person toileting, which includes using toliet, bedpan, or urinal?: A Little Help from another person bathing (including washing, rinsing, drying)?: A Little Help from another person to put on and taking off regular upper body clothing?: A Little Help from another person to put on and taking off regular lower body clothing?: A Little 6 Click Score: 20    End of Session    OT Visit Diagnosis: Unsteadiness on feet (R26.81);Other abnormalities  of gait and mobility (R26.89);Muscle weakness (generalized) (M62.81)   Activity Tolerance Patient tolerated treatment well   Patient Left in bed;with call bell/phone within reach   Nurse Communication Mobility status        Time: 4010-2725 OT Time Calculation (min): 20 min  Charges: OT General Charges $OT Visit: 1 Visit OT Treatments $Self Care/Home Management : 8-22 mins  Dalphine Handing, MSOT, OTR/L Acute Rehabilitation Services [facility] Office Number: [phone] Pager: [phone]  Dalphine Handing 08/22/2020, 11:15 AM

## 2020-11-22 ENCOUNTER — Inpatient Hospital Stay: Admit: 2020-11-22 | Discharge: 2020-11-22 | Disposition: A | Discharge status: Home / Self Care | Payer: MEDICAID

## 2020-11-22 DIAGNOSIS — K029 Dental caries, unspecified: Secondary | ICD-10-CM

## 2020-11-22 MED ORDER — MAGIC MOUTHWASH WITHOUT NYSTATIN SIMPLE
Freq: Once | Status: AC
Start: 2020-11-22 — End: 2020-11-22
  Administered 2020-11-22: 23:00:00 via OROMUCOSAL

## 2020-11-22 MED ORDER — KETOROLAC TROMETHAMINE 30 MG/ML IJ SOLN
30 MG/ML | Freq: Once | INTRAMUSCULAR | Status: AC
Start: 2020-11-22 — End: 2020-11-22
  Administered 2020-11-22: 23:00:00 via INTRAMUSCULAR

## 2020-11-22 MED FILL — KETOROLAC TROMETHAMINE 30 MG/ML IJ SOLN: 30 mg/mL | INTRAMUSCULAR | Qty: 1

## 2020-11-22 MED FILL — MAGIC MOUTHWASH WITHOUT NYSTATIN SIMPLE: Qty: 90

## 2020-11-22 NOTE — ED Notes (Signed)
Pt to ED c/o tooth infection. Pt is on amoxicillin at this time pt rating pain 9/10. Pt has not seen a dentist. Respirations unlabored.Melynda Keller [id number], RN  11/22/20 1835

## 2020-11-22 NOTE — ED Provider Notes (Signed)
[facility]      CHIEF COMPLAINT       Chief Complaint   Patient presents with   ??? Dental Pain       Nurses Notes reviewed and I agree except as noted in the HPI.      HISTORY OF PRESENT ILLNESS    Melissa Cunningham is a 45 y.o. female who presents for gum pain.  Patient reports having 7 teeth pulled 6 months ago in [place].  She now resides here and does not have insurance, a Education officer, community, or doctor.  Patient has 1 bad tooth on her lower right side.  She has been having gum pain posterior to this for the past week and a half.  The patient notes a "blister" in the area.  She went to [facility] ER and was put on amoxicillin which is not helping.  The pain shoots down to her lip and up into her head.  There is right ear pain, headaches, and a feeling of off balance intermittently.  Occasionally she has a nasty taste in her mouth.  Additionally she endorses subjective fevers and sleep disruption due to the pain.  She has been taking Midol and Excedrin with some relief.  Patient is concerned that the infection has worsened and that this is an abscess.  Patient denies URI symptoms, chest pain, dyspnea, abdominal pain, nausea, vomiting, vision changes, or other complaints.  Patient has a history of hypertension and diabetes.  Patient chews tobacco.  Patient denies possibility of pregnancy.    REVIEW OF SYSTEMS     Review of Systems   Constitutional: Positive for fever. Negative for appetite change, chills and fatigue.   HENT: Positive for dental problem, ear pain and sinus pressure. Negative for congestion, drooling, facial swelling, rhinorrhea, sore throat and trouble swallowing.    Eyes: Negative for photophobia and visual disturbance.   Respiratory: Negative for cough and shortness of breath.    Cardiovascular: Negative for chest pain.   Gastrointestinal: Negative for abdominal pain, nausea and vomiting.   Endocrine: Negative for polyuria.   Genitourinary: Negative for difficulty urinating and dysuria.    Musculoskeletal: Negative for gait problem and neck pain.   Skin: Negative for rash.   Neurological: Positive for dizziness and headaches. Negative for speech difficulty.   Hematological: Negative for adenopathy.   Psychiatric/Behavioral: Negative for confusion and sleep disturbance.        PAST MEDICAL HISTORY    has no past medical history on file.    SURGICAL HISTORY      has no past surgical history on file.    CURRENT MEDICATIONS       There are no discharge medications for this patient.      ALLERGIES     is allergic to morphine.    FAMILY HISTORY     has no family status information on file.    family history is not on file.    SOCIAL HISTORY        PHYSICAL EXAM     INITIAL VITALS:  oral temperature is 97.8 ??F (36.6 ??C). Her blood pressure is 171/102 (abnormal) and her pulse is 105. Her respiration is 18 and oxygen saturation is 95%.    Physical Exam  Vitals and nursing note reviewed.   Constitutional:       General: She is not in acute distress.     Appearance: She is well-developed. She is morbidly obese. She is not toxic-appearing or diaphoretic.  HENT:      Head: Normocephalic and atraumatic.      Jaw: No trismus.      Right Ear: Tympanic membrane, ear canal and external ear normal.      Left Ear: Tympanic membrane, ear canal and external ear normal.      Nose: Nose normal. No rhinorrhea.      Mouth/Throat:      Lips: Pink.      Mouth: Mucous membranes are moist.      Dentition: Dental tenderness, dental caries and gum lesions present. No gingival swelling or dental abscesses.      Pharynx: Oropharynx is clear. Uvula midline. No uvula swelling.        Comments: Multiple missing teeth  Eyes:      General: Lids are normal.      Conjunctiva/sclera: Conjunctivae normal.      Pupils: Pupils are equal, round, and reactive to light.   Neck:      Thyroid: No thyroid mass.      Trachea: Trachea normal. No tracheal deviation.   Cardiovascular:      Rate and Rhythm: Normal rate and regular rhythm.      Heart  sounds: Normal heart sounds.   Pulmonary:      Effort: Pulmonary effort is normal. No respiratory distress.      Breath sounds: Normal breath sounds. No decreased breath sounds or wheezing.   Abdominal:      General: There is no distension.      Palpations: Abdomen is soft. Abdomen is not rigid.      Tenderness: There is no abdominal tenderness.   Musculoskeletal:         General: Normal range of motion.      Cervical back: Normal range of motion and neck supple. No edema or rigidity.   Lymphadenopathy:      Head:      Right side of head: No submental, submandibular or preauricular adenopathy.      Left side of head: No submental, submandibular or preauricular adenopathy.      Cervical: No cervical adenopathy.   Skin:     General: Skin is warm and dry.      Coloration: Skin is not pale.      Findings: No rash.   Neurological:      Mental Status: She is alert and oriented to person, place, and time.      GCS: GCS eye subscore is 4. GCS verbal subscore is 5. GCS motor subscore is 6.      Gait: Gait normal.   Psychiatric:         Speech: Speech normal.         Behavior: Behavior normal. Behavior is cooperative.         Thought Content: Thought content normal.         DIFFERENTIAL DIAGNOSIS:   Including but not limited to: Gingival growth, cyst, dental carie, considered but less likely due to presentation infection versus abscess    DIAGNOSTIC RESULTS     EKG: All EKG's are interpreted by theEmergency Department Physician who either signs or Co-signs this chart in the absence of a cardiologist.  None    RADIOLOGY: non-plain film images(s) such as CT,Ultrasound and MRI are read by the radiologist.  Plain radiographic images are visualized and preliminarily interpreted by the emergency physician unless otherwise stated below.  No orders to display       LABS:   Labs Reviewed - No data to  display    EMERGENCY DEPARTMENT COURSE:   Vitals:    Vitals:    11/22/20 1833 11/22/20 1916   BP: (!) 171/102    Pulse: 105    Resp: 18     Temp:  97.8 ??F (36.6 ??C)   TempSrc:  Oral   SpO2: 95%        Patient was seen in the emergency department during the global pandemic, when there was surge capacity and regional healthcare crisis.    MDM:  The patient was seen and evaluated by me in the intake area. Vital signs were reviewed and noted stable. Physical exam revealed a pleasant 45 year old female with no trismus.  There was what appeared to be a 1 cm gingival growth yellowish in color to the posterior gingiva on the right lower side.  Area was mildly tender and did not appear infected.  No labs or imaging were deemed indicated at this time. I discussed this with the patient and available family and they were agreeable.  Referrals to dental and family clinic were given.  Patient was given Magic mouthwash and Toradol.  She was instructed to finish her amoxicillin.  Discharge plan was discussed, and patient was comfortable with plan of care. I have given the patient strict written and verbal instructions about care at home, follow-up, and signs and symptoms of worsening of condition and they did verbalize understanding.    CRITICAL CARE:   None    CONSULTS:  None    PROCEDURES:  None    FINAL IMPRESSION      1. Pain, dental          DISPOSITION/PLAN     1. Pain, dental        PATIENT REFERRED TO:  [facility]  [address]  [place] [address]  [phone]  Schedule an appointment as soon as possible for a visit       [facility]  [phone]  [place] [address]  [phone]  Schedule an appointment as soon as possible for a visit   Please arrive 15 minutes early for paperwork.      DISCHARGE MEDICATIONS:  There are no discharge medications for this patient.      (Please note that portions of this note were completed with a voice recognition program.  Efforts were made to edit the dictations but occasionally words are mis-transcribed.)    Shellia Carwin, PA-C 11/22/20 7:32  PM    Shellia Carwin, PA-C        Shellia Carwin, PA-C  11/22/20 1947

## 2020-11-25 ENCOUNTER — Inpatient Hospital Stay: Admit: 2020-11-25 | Discharge: 2020-11-25 | Disposition: A | Discharge status: Home / Self Care | Payer: MEDICAID

## 2020-11-25 DIAGNOSIS — K029 Dental caries, unspecified: Secondary | ICD-10-CM

## 2020-11-25 LAB — CBC WITH AUTO DIFFERENTIAL
Basophils Absolute: 0 10*3/uL (ref 0.0–0.1)
Basophils: 0.6 %
Eosinophils Absolute: 0.1 10*3/uL (ref 0.0–0.4)
Eosinophils: 2 %
Hematocrit: 40.8 % (ref 37.0–47.0)
Hemoglobin: 12.8 gm/dl (ref 12.0–16.0)
Immature Grans (Abs): 0.02 10*3/uL (ref 0.00–0.07)
Immature Granulocytes: 0.3 %
Lymphocytes Absolute: 2 10*3/uL (ref 1.0–4.8)
Lymphocytes: 30.5 %
MCH: 27.5 pg (ref 26.0–33.0)
MCHC: 31.4 gm/dl — ABNORMAL LOW (ref 32.2–35.5)
MCV: 87.6 fL (ref 81.0–99.0)
MPV: 12.3 fL (ref 9.4–12.4)
Monocytes Absolute: 0.6 10*3/uL (ref 0.4–1.3)
Monocytes: 8.9 %
Platelets: 240 10*3/uL (ref 130–400)
RBC: 4.66 10*6/uL (ref 4.20–5.40)
RDW-CV: 13.5 % (ref 11.5–14.5)
RDW-SD: 42.3 fL (ref 35.0–45.0)
Seg Neutrophils: 57.7 %
Segs Absolute: 3.7 10*3/uL (ref 1.8–7.7)
WBC: 6.4 10*3/uL (ref 4.8–10.8)
nRBC: 0 /100 wbc

## 2020-11-25 LAB — BASIC METABOLIC PANEL
BUN: 9 mg/dL (ref 7–22)
CO2: 24 meq/L (ref 23–33)
Calcium: 9.4 mg/dL (ref 8.5–10.5)
Chloride: 101 meq/L (ref 98–111)
Creatinine: 0.8 mg/dL (ref 0.4–1.2)
Glucose: 296 mg/dL — ABNORMAL HIGH (ref 70–108)
Potassium: 3.5 meq/L (ref 3.5–5.2)
Sodium: 139 meq/L (ref 135–145)

## 2020-11-25 LAB — GLOMERULAR FILTRATION RATE, ESTIMATED: Est, Glom Filt Rate: 78 mL/min/{1.73_m2} — AB

## 2020-11-25 LAB — TROPONIN: Troponin T: <0.01 ng/ml

## 2020-11-25 LAB — ANION GAP: Anion Gap: 14 meq/L (ref 8.0–16.0)

## 2020-11-25 LAB — OSMOLALITY: Osmolality Calc: 287.2 mOsmol/kg (ref 275.0–300.0)

## 2020-11-25 MED ORDER — KETOROLAC TROMETHAMINE 30 MG/ML IJ SOLN
30 MG/ML | Freq: Once | INTRAMUSCULAR | Status: DC
Start: 2020-11-25 — End: 2020-11-25

## 2020-11-25 MED ORDER — MAGIC MOUTHWASH
Freq: Four times a day (QID) | 0 refills | Status: DC | PRN
Start: 2020-11-25 — End: 2021-01-11

## 2020-11-25 MED ORDER — KETOROLAC TROMETHAMINE 30 MG/ML IJ SOLN
30 MG/ML | Freq: Once | INTRAMUSCULAR | Status: AC
Start: 2020-11-25 — End: 2020-11-25
  Administered 2020-11-25: 12:00:00 via INTRAMUSCULAR

## 2020-11-25 MED ORDER — TRAMADOL HCL 50 MG PO TABS
50 MG | ORAL_TABLET | Freq: Four times a day (QID) | ORAL | 0 refills | Status: AC | PRN
Start: 2020-11-25 — End: 2020-11-28

## 2020-11-25 MED ORDER — TRAMADOL HCL 50 MG PO TABS
50 MG | Freq: Once | ORAL | Status: AC
Start: 2020-11-25 — End: 2020-11-25
  Administered 2020-11-25: 12:00:00 via ORAL

## 2020-11-25 MED FILL — KETOROLAC TROMETHAMINE 30 MG/ML IJ SOLN: 30 MG/ML | INTRAMUSCULAR | Qty: 1

## 2020-11-25 MED FILL — TRAMADOL HCL 50 MG PO TABS: 50 mg | ORAL | Qty: 1

## 2020-11-25 NOTE — ED Notes (Signed)
Pt reassessed at this time. States pain is 0/10 now. Blanket provided and lights dimmed. Voices no other needs or concerns     Lovenia Kim, RN  11/25/20 [phone]

## 2020-11-25 NOTE — ED Triage Notes (Signed)
Pt presents to the ED with c/o dental pain. Pt states she was seen in the ED 2-3 days ago. Pt states she was prescribed Magic Mouthwash and referred to the dental clinic. Pt states she has attempted to get in contact with the dental clinic 3x. Pt states pain is worse.

## 2020-11-25 NOTE — ED Notes (Signed)
Upon first contact with patient this RN receives bedside shift report from [facility]. Pt resting on cot. Rates pain 8/10 in mouth at this time. EKG completed and pt updated. Voices no other needs or concerns at this time. Call light in reach     Lovenia Kim, RN  11/25/20 [phone]

## 2020-11-25 NOTE — ED Provider Notes (Signed)
[facility]    CHIEF COMPLAINT       Chief Complaint   Patient presents with   ??? Dental Pain       Nurses Notes reviewed and I agree except as noted in the HPI.    HISTORY OF PRESENT ILLNESS    Melissa Cunningham isa 45 y.o. female who presents to the ED for evaluation of dental pain. Patient states that she had several teeth pull in [place] ~6 months ago. In th last month, she has had worsening dental pain. She was seen by the Emergency Department service on 3/13 and given Magic Mouthwash, which the patient reports helps some. She reports to have been treated with Amoxicillin recently for the second time. She has used icy hot on the skin along her jaw with some relief in pain. She reports difficulty getting in to see the dental clinics locally. She also complains of chest pain which she describes as a sharp pressure in her sternal region that travels down towards her left breast. She reports to have ~20 minute episodes of this pain and then it will subside. She does not report that this is provoked. She does report a history of angina for which she took Nitroglycerine for, but has since then run out. She denies any fever, chills, nausea, vomiting, diarrhea, constipation.    HPI was provided by the patient.    REVIEW OF SYSTEMS     Review of Systems   Constitutional: Negative for chills and fever.   HENT: Positive for dental problem and mouth sores. Negative for congestion and rhinorrhea.    Eyes: Negative for visual disturbance.   Respiratory: Negative for cough and shortness of breath.    Cardiovascular: Positive for chest pain.   Gastrointestinal: Negative for abdominal pain, constipation, diarrhea, nausea and vomiting.   Endocrine: Negative for polyuria.   Genitourinary: Negative for difficulty urinating and dysuria.   Musculoskeletal: Negative for gait problem and myalgias.   Neurological: Negative for facial asymmetry, speech difficulty and weakness.   Psychiatric/Behavioral: Negative  for agitation and behavioral problems.        PAST MEDICAL HISTORY   No past medical history on file.    SURGICALHISTORY      has no past surgical history on file.    CURRENT MEDICATIONS       Discharge Medication List as of 11/25/2020  8:17 AM          ALLERGIES     is allergic to morphine.    FAMILY HISTORY     has no family status information on file.    family history is not on file.    SOCIAL HISTORY       Social History     Socioeconomic History   ??? Marital status: Single     Spouse name: Not on file   ??? Number of children: Not on file   ??? Years of education: Not on file   ??? Highest education level: Not on file   Occupational History   ??? Not on file   Tobacco Use   ??? Smoking status: Not on file   ??? Smokeless tobacco: Not on file   Substance and Sexual Activity   ??? Alcohol use: Not on file   ??? Drug use: Not on file   ??? Sexual activity: Not on file   Other Topics Concern   ??? Not on file   Social History Narrative   ??? Not on file  Social Determinants of Health     Financial Resource Strain:    ??? Difficulty of Paying Living Expenses: Not on file   Food Insecurity:    ??? Worried About Programme researcher, broadcasting/film/video in the Last Year: Not on file   ??? Ran Out of Food in the Last Year: Not on file   Transportation Needs:    ??? Lack of Transportation (Medical): Not on file   ??? Lack of Transportation (Non-Medical): Not on file   Physical Activity:    ??? Days of Exercise per Week: Not on file   ??? Minutes of Exercise per Session: Not on file   Stress:    ??? Feeling of Stress : Not on file   Social Connections:    ??? Frequency of Communication with Friends and Family: Not on file   ??? Frequency of Social Gatherings with Friends and Family: Not on file   ??? Attends Religious Services: Not on file   ??? Active Member of Clubs or Organizations: Not on file   ??? Attends Banker Meetings: Not on file   ??? Marital Status: Not on file   Intimate Partner Violence:    ??? Fear of Current or Ex-Partner: Not on file   ??? Emotionally Abused: Not  on file   ??? Physically Abused: Not on file   ??? Sexually Abused: Not on file   Housing Stability:    ??? Unable to Pay for Housing in the Last Year: Not on file   ??? Number of Places Lived in the Last Year: Not on file   ??? Unstable Housing in the Last Year: Not on file       PHYSICAL EXAM     INITIAL VITALS:  oral temperature is 98 ??F (36.7 ??C). Her blood pressure is 147/87 (abnormal) and her pulse is 78. Her respiration is 18 and oxygen saturation is 97%.    Physical Exam  Constitutional:       Appearance: Normal appearance.   HENT:      Head: Normocephalic and atraumatic.      Right Ear: External ear normal.      Left Ear: External ear normal.      Nose: Nose normal.      Mouth/Throat:      Mouth: Mucous membranes are moist.      Dentition: Abnormal dentition. Dental caries and gum lesions present. No gingival swelling or dental abscesses.      Tongue: Tongue does not deviate from midline.      Palate: No lesions.      Pharynx: Oropharynx is clear. No oropharyngeal exudate.      Tonsils: No tonsillar exudate.        Comments: Multiple missing teeth. The right lower molar has significant dental erosion but is not signifcantly tender to touch. There is a white lesion in the right lower jaw mucosa.  Eyes:      General: No scleral icterus.     Extraocular Movements: Extraocular movements intact.      Conjunctiva/sclera: Conjunctivae normal.   Cardiovascular:      Rate and Rhythm: Normal rate and regular rhythm.      Pulses: Normal pulses.      Heart sounds: Normal heart sounds. No murmur heard.      Pulmonary:      Effort: Pulmonary effort is normal.      Breath sounds: Normal breath sounds.   Abdominal:      General: Abdomen is flat.  Tenderness: There is no abdominal tenderness.   Musculoskeletal:      Right lower leg: No edema.      Left lower leg: No edema.   Skin:     General: Skin is warm and dry.   Neurological:      Mental Status: She is alert and oriented to person, place, and time.   Psychiatric:          Mood and Affect: Mood normal.         Behavior: Behavior normal.         DIFFERENTIAL DIAGNOSIS:   Dental erosion, dental infection, mucosal ulceration    Angina, ACS, anxiety    DIAGNOSTIC RESULTS     EKG read and interpreted by myself gives impression of normal sinus rhythm with heart rate of 71; interval 140; QRS 88;QTc 449; axis P-27, R-19, T--141.       RADIOLOGY: non-plainfilm images(s) such as CT, Ultrasound and MRI are read by the radiologist.  Plain radiographic images are visualized and preliminarily interpreted by the emergency physician unless otherwise stated below.  No orders to display         LABS:   Labs Reviewed   CBC WITH AUTO DIFFERENTIAL - Abnormal; Notable for the following components:       Result Value    MCHC 31.4 (*)     All other components within normal limits   BASIC METABOLIC PANEL - Abnormal; Notable for the following components:    Glucose 296 (*)     All other components within normal limits   GLOMERULAR FILTRATION RATE, ESTIMATED - Abnormal; Notable for the following components:    Est, Glom Filt Rate 78 (*)     All other components within normal limits   TROPONIN   ANION GAP   OSMOLALITY       EMERGENCY DEPARTMENT COURSE:   Vitals:    Vitals:    11/25/20 0647 11/25/20 0717 11/25/20 0803   BP: (!) 154/92 (!) 171/92 (!) 147/87   Pulse: 88 82 78   Resp: 17 18 18    Temp: 98 ??F (36.7 ??C)     TempSrc: Oral     SpO2: 94% 97% 97%     MDM    Patient was seen and evaluated in the emergency department, patient appeared to be in no acute distress, vital signs reviewed, slight hypertension noted.  Physical exam was completed, there is erosion to the affected tooth, there is no significant edema to the gumline.  Did not feel any imaging was needed.  No trismus is noted.  Labs were obtained, no leukocytosis noted, patient had complained of some chest pain out of her nitroglycerin, negative troponin noted.  EKG no abnormality noted.  Discussed my findings and plan for the patient they are amenable  with discharge.  They will be given tramadol and more Magic mouthwash for pain relief.  They are given a list of dentist to follow-up with.  They verbalized understanding of plan of care.  They requested a appointment with a family medicine doctor, they are given appointment the following day for refill of medications, and establish care.  Medications   traMADol (ULTRAM) tablet 50 mg (50 mg Oral Given 11/25/20 0731)   ketorolac (TORADOL) injection 30 mg (30 mg IntraMUSCular Given 11/25/20 0731)       Patient was seenindependently by myself. The patient's final impression and disposition and plan was determined by myself.     CRITICAL CARE:   None  CONSULTS:  None    PROCEDURES:  None    FINAL IMPRESSION     1. Pain, dental    2. Dental caries          DISPOSITION/PLAN   Patient discharged    PATIENT REFERREDTO:  Dentist of your choice    Call   For follow up and evaluation      DISCHARGE MEDICATIONS:  Discharge Medication List as of 11/25/2020  8:17 AM      START taking these medications    Details   Magic Mouthwash (MIRACLE MOUTHWASH) Swish and spit 5 mLs 4 times daily as needed for Irritation 1:1:1, lidocaine, diphenhydramine, maalox, Disp-240 mL, R-0Print      traMADol (ULTRAM) 50 MG tablet Take 1 tablet by mouth every 6 hours as needed for Pain for up to 3 days. Intended supply: 3 days. Take lowest dose possible to manage pain, Disp-12 tablet, R-0Print             (Please note that portions of this note were completed with a voice recognition program.  Efforts were made to edit the dictations but occasionally words are mis-transcribed.)    Provider:  I personally performed the services described in the documentation,reviewed and edited the documentation which was dictated to the scribe in my presence, and it accurately records my words and actions.    Caterina Racine, CNP 11/25/20 6:42 PM    Melissa Lyons Dessiree Sze, APRN - CNP         [employer], APRN - CNP  11/25/20 1842

## 2020-11-26 ENCOUNTER — Encounter: Primary: Family

## 2020-11-26 LAB — EKG 12-LEAD
Atrial Rate: 71 {beats}/min
P Axis: 27 degrees
P-R Interval: 140 ms
Q-T Interval: 414 ms
QRS Duration: 88 ms
QTc Calculation (Bazett): 449 ms
R Axis: 19 degrees
T Axis: -141 degrees
Ventricular Rate: 71 {beats}/min

## 2020-12-07 ENCOUNTER — Inpatient Hospital Stay
Admit: 2020-12-07 | Discharge: 2020-12-07 | Disposition: A | Discharge status: Home / Self Care | Payer: MEDICAID | Attending: Emergency Medicine

## 2020-12-07 DIAGNOSIS — R739 Hyperglycemia, unspecified: Secondary | ICD-10-CM

## 2020-12-07 LAB — PROCALCITONIN: Procalcitonin: 0.1 ng/mL — ABNORMAL HIGH (ref 0.01–0.09)

## 2020-12-07 LAB — URINE WITH REFLEXED MICRO
Amorphous, UA: NONE SEEN
Bilirubin Urine: NEGATIVE
Blood, Urine: NEGATIVE
CASTS 2: NONE SEEN /lpf
Casts UA: NONE SEEN /lpf
Crystals, UA: NONE SEEN
Glucose, Ur: >=1000 mg/dl — AB
Ketones, Urine: 15 — AB
Leukocyte Esterase, Urine: NEGATIVE
MISCELLANEOUS 2: NONE SEEN
Mucus, UA: NONE SEEN
Nitrite, Urine: NEGATIVE
Renal Epithelial, UA: NONE SEEN
Specific Gravity, Urine: >1.03 — AB (ref 1.002–1.030)
Urobilinogen, Urine: 0.2 eu/dl (ref 0.0–1.0)
pH, UA: 5 (ref 5.0–9.0)

## 2020-12-07 LAB — CBC WITH AUTO DIFFERENTIAL
Basophils Absolute: 0 10*3/uL (ref 0.0–0.1)
Basophils: 0.4 %
Eosinophils Absolute: 0.1 10*3/uL (ref 0.0–0.4)
Eosinophils: 0.7 %
Hematocrit: 44.6 % (ref 37.0–47.0)
Hemoglobin: 14.8 gm/dl (ref 12.0–16.0)
Immature Grans (Abs): 0.03 10*3/uL (ref 0.00–0.07)
Immature Granulocytes: 0.3 %
Lymphocytes Absolute: 2.4 10*3/uL (ref 1.0–4.8)
Lymphocytes: 26.4 %
MCH: 27.7 pg (ref 26.0–33.0)
MCHC: 33.2 gm/dl (ref 32.2–35.5)
MCV: 83.5 fL (ref 81.0–99.0)
MPV: 12.8 fL — ABNORMAL HIGH (ref 9.4–12.4)
Monocytes Absolute: 0.9 10*3/uL (ref 0.4–1.3)
Monocytes: 10.2 %
Platelets: 318 10*3/uL (ref 130–400)
RBC: 5.34 10*6/uL (ref 4.20–5.40)
RDW-CV: 13.5 % (ref 11.5–14.5)
RDW-SD: 40.9 fL (ref 35.0–45.0)
Seg Neutrophils: 62 %
Segs Absolute: 5.6 10*3/uL (ref 1.8–7.7)
WBC: 9 10*3/uL (ref 4.8–10.8)
nRBC: 0 /100 wbc

## 2020-12-07 LAB — BASIC METABOLIC PANEL W/ REFLEX TO MG FOR LOW K
BUN: 9 mg/dL (ref 7–22)
CO2: 25 meq/L (ref 23–33)
Calcium: 9.4 mg/dL (ref 8.5–10.5)
Chloride: 87 meq/L — ABNORMAL LOW (ref 98–111)
Creatinine: 0.8 mg/dL (ref 0.4–1.2)
Glucose: 426 mg/dL — ABNORMAL HIGH (ref 70–108)
Potassium reflex Magnesium: 3.3 meq/L — ABNORMAL LOW (ref 3.5–5.2)
Sodium: 129 meq/L — ABNORMAL LOW (ref 135–145)

## 2020-12-07 LAB — BLOOD GAS, VENOUS
Base Exc, Mixed: 3.7 mmol/l — ABNORMAL HIGH (ref <=3.0)
COLLECTED BY:: 21032
HCO3, Mixed: 27 mmol/l (ref 23–28)
O2 Sat, Mixed: 95 %
PCO2, MIXED VENOUS: 38 mmhg — ABNORMAL LOW (ref 41–51)
PH MIXED: 7.47 — ABNORMAL HIGH (ref 7.31–7.41)
PO2, Mixed: 71 mmhg — ABNORMAL HIGH (ref 25–40)

## 2020-12-07 LAB — EKG 12-LEAD
Atrial Rate: 114 {beats}/min
P Axis: 44 degrees
P-R Interval: 138 ms
Q-T Interval: 378 ms
QRS Duration: 146 ms
QTc Calculation (Bazett): 521 ms
R Axis: 169 degrees
T Axis: 14 degrees
Ventricular Rate: 114 {beats}/min

## 2020-12-07 LAB — POCT GLUCOSE
Glucose: 438 mg/dL
Glucose: 354 mg/dL
POC Glucose: 438 mg/dl — ABNORMAL HIGH (ref 70–108)
POC Glucose: 354 mg/dl — ABNORMAL HIGH (ref 70–108)
POC Glucose: 349 mg/dl — ABNORMAL HIGH (ref 70–108)
POC Glucose: 276 mg/dl — ABNORMAL HIGH (ref 70–108)
POC Glucose: 281 mg/dl — ABNORMAL HIGH (ref 70–108)
POC Glucose: 243 mg/dl — ABNORMAL HIGH (ref 70–108)

## 2020-12-07 LAB — LACTATE, SEPSIS
Lactic Acid, Sepsis: 3 mmol/L — ABNORMAL HIGH (ref 0.5–1.9)
Lactic Acid, Sepsis: 2 mmol/L — ABNORMAL HIGH (ref 0.5–1.9)

## 2020-12-07 LAB — TROPONIN: Troponin T: <0.01 ng/ml

## 2020-12-07 LAB — GLOMERULAR FILTRATION RATE, ESTIMATED: Est, Glom Filt Rate: 78 mL/min/{1.73_m2} — AB

## 2020-12-07 LAB — C-REACTIVE PROTEIN: CRP: 1.29 mg/dl — ABNORMAL HIGH (ref 0.00–1.00)

## 2020-12-07 LAB — BRAIN NATRIURETIC PEPTIDE: Pro-BNP: 278.1 pg/mL (ref 0.0–450.0)

## 2020-12-07 LAB — OSMOLALITY: Osmolality Calc: 275.8 mOsmol/kg (ref 275.0–300.0)

## 2020-12-07 LAB — BETA-HYDROXYBUTYRATE: Beta-Hydroxybutyrate: 8.65 mg/dl — ABNORMAL HIGH (ref 0.20–2.81)

## 2020-12-07 LAB — MAGNESIUM: Magnesium: 1.7 mg/dL (ref 1.6–2.4)

## 2020-12-07 LAB — ANION GAP: Anion Gap: 17 meq/L — ABNORMAL HIGH (ref 8.0–16.0)

## 2020-12-07 LAB — LIPASE: Lipase: 29.5 U/L (ref 5.6–51.3)

## 2020-12-07 MED ORDER — SODIUM CHLORIDE 0.9 % IV BOLUS
0.9 % | Freq: Once | INTRAVENOUS | Status: AC
Start: 2020-12-07 — End: 2020-12-07
  Administered 2020-12-07: 13:00:00 via INTRAVENOUS

## 2020-12-07 MED ORDER — KETOROLAC TROMETHAMINE 30 MG/ML IJ SOLN
30 MG/ML | Freq: Once | INTRAMUSCULAR | Status: AC
Start: 2020-12-07 — End: 2020-12-07
  Administered 2020-12-07: 15:00:00 via INTRAVENOUS

## 2020-12-07 MED ORDER — AMLODIPINE BESYLATE 5 MG PO TABS
5 MG | ORAL_TABLET | Freq: Every day | ORAL | 0 refills | Status: AC
Start: 2020-12-07 — End: ?
  Filled 2020-12-07: qty 30, 30d supply, fill #0

## 2020-12-07 MED ORDER — POTASSIUM CHLORIDE CRYS ER 20 MEQ PO TBCR
20 MEQ | Freq: Once | ORAL | Status: DC
Start: 2020-12-07 — End: 2020-12-07

## 2020-12-07 MED ORDER — SODIUM CHLORIDE 0.9 % IV BOLUS
0.9 | Freq: Once | INTRAVENOUS | Status: AC
Start: 2020-12-07 — End: 2020-12-07
  Administered 2020-12-07: 14:00:00 via INTRAVENOUS

## 2020-12-07 MED ORDER — INSULIN REGULAR HUMAN 100 UNIT/ML IJ SOLN
100 UNIT/ML | Freq: Once | INTRAMUSCULAR | Status: AC
Start: 2020-12-07 — End: 2020-12-07
  Administered 2020-12-07: 17:00:00 via INTRAVENOUS

## 2020-12-07 MED ORDER — METFORMIN HCL 1000 MG PO TABS
1000 MG | ORAL_TABLET | Freq: Two times a day (BID) | ORAL | 0 refills | Status: DC
Start: 2020-12-07 — End: 2021-01-14
  Filled 2020-12-07: qty 60, 30d supply, fill #0

## 2020-12-07 MED ORDER — POTASSIUM CHLORIDE 10 MEQ/100ML IV SOLN
10100 MEQ/0ML | Freq: Once | INTRAVENOUS | Status: AC
Start: 2020-12-07 — End: 2020-12-07
  Administered 2020-12-07: 14:00:00 via INTRAVENOUS

## 2020-12-07 MED ORDER — INSULIN REGULAR HUMAN 100 UNIT/ML IJ SOLN
100 UNIT/ML | Freq: Once | INTRAMUSCULAR | Status: AC
Start: 2020-12-07 — End: 2020-12-07
  Administered 2020-12-07: 15:00:00 via INTRAVENOUS

## 2020-12-07 MED ORDER — ONDANSETRON HCL 4 MG/2ML IJ SOLN
4 MG/2ML | Freq: Once | INTRAMUSCULAR | Status: AC
Start: 2020-12-07 — End: 2020-12-07
  Administered 2020-12-07: 13:00:00 via INTRAVENOUS

## 2020-12-07 MED FILL — KETOROLAC TROMETHAMINE 30 MG/ML IJ SOLN: 30 mg/mL | INTRAMUSCULAR | Qty: 1

## 2020-12-07 MED FILL — INSULIN REGULAR HUMAN 100 UNIT/ML IJ SOLN: 100 [IU]/mL | INTRAMUSCULAR | Qty: 0.06

## 2020-12-07 MED FILL — POTASSIUM CHLORIDE 10 MEQ/100ML IV SOLN: 10 MEQ/0ML | INTRAVENOUS | Qty: 100

## 2020-12-07 MED FILL — POTASSIUM CHLORIDE CRYS ER 20 MEQ PO TBCR: 20 MEQ | ORAL | Qty: 2

## 2020-12-07 MED FILL — ONDANSETRON HCL 4 MG/2ML IJ SOLN: 4 MG/2ML | INTRAMUSCULAR | Qty: 2

## 2020-12-07 NOTE — ED Notes (Signed)
Patient resting in bed eating hot lunch tray. Patients blood glucose level is 243.      Melissa Cunningham  12/07/20 1334

## 2020-12-07 NOTE — ED Notes (Signed)
Patients blood sugar was 276 after insulin given.     Melissa Cunningham  12/07/20 1149

## 2020-12-07 NOTE — ED Notes (Signed)
Patient is resting comfortably in cot.      Julienne Kass  12/07/20 1144

## 2020-12-07 NOTE — ED Notes (Signed)
Patient resting comfortably. Patients blood glucose level at this time is 281.      Julienne Kass  12/07/20 1251

## 2020-12-07 NOTE — ED Provider Notes (Signed)
[facility]    EMERGENCY MEDICINE     Pt Name: Melissa Cunningham  MRN: [id number]  Birthdate 1975/11/30  Date of evaluation: 12/07/2020  Provider: Rosaura Carpenter, MD, FACEP    CHIEF COMPLAINT       Chief Complaint   Patient presents with   ??? Hyperglycemia   ??? Nausea       HISTORY OF PRESENT ILLNESS    Melissa Cunningham is a pleasant 45 y.o. female who presents to the emergency department from home for hyperglycemia and nausea. The patient reports feeling "hot," lightheaded and nauseous at work this morning, symptoms she associates with her blood pressure and glucose being high. She has also been seeing "green dots" off and on for the past 2 days and reports polyphagia, polydipsia, and fatigue. She recently moved to the [place] area from [place] and has been unable to fill her prescriptions for metformin and lisinopril and has not been taking them for the past 1 mo. She denies any fever, chills, vomiting, SOB and cough but admit to chest pain that she says is chronic.       Triage notes and Nursing notes were reviewed by myself.  Any discrepancies are addressed above.    PAST MEDICAL HISTORY   No past medical history on file.    SURGICAL HISTORY     No past surgical history on file.    CURRENT MEDICATIONS       Discharge Medication List as of 12/07/2020  1:48 PM      CONTINUE these medications which have NOT CHANGED    Details   Magic Mouthwash (MIRACLE MOUTHWASH) Swish and spit 5 mLs 4 times daily as needed for Irritation 1:1:1, lidocaine, diphenhydramine, maalox, Disp-240 mL, R-0Print             ALLERGIES     Morphine    FAMILY HISTORY     No family history on file.     SOCIAL HISTORY       Social History     Socioeconomic History   ??? Marital status: Single     Spouse name: Not on file   ??? Number of children: Not on file   ??? Years of education: Not on file   ??? Highest education level: Not on file   Occupational History   ??? Not on file   Tobacco Use   ??? Smoking status: Not on file   ??? Smokeless  tobacco: Not on file   Substance and Sexual Activity   ??? Alcohol use: Not on file   ??? Drug use: Not on file   ??? Sexual activity: Not on file   Other Topics Concern   ??? Not on file   Social History Narrative   ??? Not on file     Social Determinants of Health     Financial Resource Strain:    ??? Difficulty of Paying Living Expenses: Not on file   Food Insecurity:    ??? Worried About Running Out of Food in the Last Year: Not on file   ??? Ran Out of Food in the Last Year: Not on file   Transportation Needs:    ??? Lack of Transportation (Medical): Not on file   ??? Lack of Transportation (Non-Medical): Not on file   Physical Activity:    ??? Days of Exercise per Week: Not on file   ??? Minutes of Exercise per Session: Not on file   Stress:    ??? Feeling  of Stress : Not on file   Social Connections:    ??? Frequency of Communication with Friends and Family: Not on file   ??? Frequency of Social Gatherings with Friends and Family: Not on file   ??? Attends Religious Services: Not on file   ??? Active Member of Clubs or Organizations: Not on file   ??? Attends BankerClub or Organization Meetings: Not on file   ??? Marital Status: Not on file   Intimate Partner Violence:    ??? Fear of Current or Ex-Partner: Not on file   ??? Emotionally Abused: Not on file   ??? Physically Abused: Not on file   ??? Sexually Abused: Not on file   Housing Stability:    ??? Unable to Pay for Housing in the Last Year: Not on file   ??? Number of Places Lived in the Last Year: Not on file   ??? Unstable Housing in the Last Year: Not on file       REVIEW OF SYSTEMS     Review of Systems   Constitutional: Positive for fatigue. Negative for chills, diaphoresis, fever and unexpected weight change.   HENT: Negative for congestion.    Eyes: Positive for visual disturbance.   Respiratory: Negative for cough, shortness of breath and wheezing.    Cardiovascular: Positive for chest pain. Negative for palpitations and leg swelling.   Gastrointestinal: Positive for nausea. Negative for abdominal  pain, constipation, diarrhea and vomiting.   Endocrine: Positive for polydipsia and polyuria. Negative for polyphagia.   Genitourinary: Negative for dysuria.   Musculoskeletal: Negative for arthralgias and back pain.   Skin: Negative for rash.   Neurological: Positive for light-headedness. Negative for headaches.   Hematological: Negative for adenopathy.       Except as noted above the remainder of the review of systems was reviewed and is.   PHYSICAL EXAM    (up to 7 for level 4, 8 or more for level 5)     ED Triage Vitals [12/07/20 0751]   BP Temp Temp Source Pulse Resp SpO2 Height Weight   (!) 158/121 97.9 ??F (36.6 ??C) Oral 117 20 94 % 5' 0.25" (1.53 m) 245 lb (111.1 kg)       Physical Exam  Vitals and nursing note reviewed. Exam conducted with a chaperone present.   Constitutional:       General: She is not in acute distress.     Appearance: Normal appearance. She is normal weight. She is not ill-appearing.   HENT:      Head: Normocephalic and atraumatic.      Right Ear: External ear normal.      Left Ear: External ear normal.      Nose: Nose normal. No congestion.      Mouth/Throat:      Mouth: Mucous membranes are moist.      Pharynx: Oropharynx is clear. No oropharyngeal exudate or posterior oropharyngeal erythema.   Eyes:      Extraocular Movements: Extraocular movements intact.      Pupils: Pupils are equal, round, and reactive to light.   Cardiovascular:      Rate and Rhythm: Regular rhythm. Tachycardia present.      Pulses: Normal pulses.      Heart sounds: No murmur heard.  No friction rub. No gallop.    Pulmonary:      Effort: Pulmonary effort is normal. No respiratory distress.      Breath sounds: Normal breath sounds. No wheezing.  Abdominal:      General: Abdomen is flat. There is no distension.      Palpations: Abdomen is soft.      Tenderness: There is no abdominal tenderness. There is no guarding or rebound.   Musculoskeletal:         General: Normal range of motion.   Skin:     General: Skin is  warm and dry.      Capillary Refill: Capillary refill takes less than 2 seconds.      Comments: Slightly decreased skin turgor.   Neurological:      General: No focal deficit present.      Mental Status: She is alert and oriented to person, place, and time.         DIAGNOSTIC RESULTS     EKG:(none if blank)  All EKG's are interpreted by theEmergency Department Physician who either signs or Co-signs this chart in the absence of a cardiologist.        RADIOLOGY: (none if blank)   Interpretation per the Radiologistbelow, if available at the time of this note:    No orders to display       LABS:  Labs Reviewed   BASIC METABOLIC PANEL W/ REFLEX TO MG FOR LOW K - Abnormal; Notable for the following components:       Result Value    Sodium 129 (*)     Potassium reflex Magnesium 3.3 (*)     Chloride 87 (*)     Glucose 426 (*)     All other components within normal limits   CBC WITH AUTO DIFFERENTIAL - Abnormal; Notable for the following components:    MPV 12.8 (*)     All other components within normal limits   BETA-HYDROXYBUTYRATE - Abnormal; Notable for the following components:    Beta-Hydroxybutyrate 8.65 (*)     All other components within normal limits   BLOOD GAS, VENOUS - Abnormal; Notable for the following components:    PH MIXED 7.47 (*)     PCO2, MIXED VENOUS 38 (*)     PO2, Mixed 71 (*)     Base Exc, Mixed 3.7 (*)     All other components within normal limits   LACTATE, SEPSIS - Abnormal; Notable for the following components:    Lactic Acid, Sepsis 3.0 (*)     All other components within normal limits   LACTATE, SEPSIS - Abnormal; Notable for the following components:    Lactic Acid, Sepsis 2.0 (*)     All other components within normal limits   C-REACTIVE PROTEIN - Abnormal; Notable for the following components:    CRP 1.29 (*)     All other components within normal limits   PROCALCITONIN - Abnormal; Notable for the following components:    Procalcitonin 0.10 (*)     All other components within normal limits    ANION GAP - Abnormal; Notable for the following components:    Anion Gap 17.0 (*)     All other components within normal limits   GLOMERULAR FILTRATION RATE, ESTIMATED - Abnormal; Notable for the following components:    Est, Glom Filt Rate 78 (*)     All other components within normal limits   URINE WITH REFLEXED MICRO - Abnormal; Notable for the following components:    Glucose, Ur >= 1000 (*)     Ketones, Urine 15 (*)     Specific Gravity, Urine > 1.030 (*)     Protein, UA TRACE (*)  All other components within normal limits   POCT GLUCOSE - Abnormal; Notable for the following components:    POC Glucose 438 (*)     All other components within normal limits   POCT GLUCOSE - Abnormal; Notable for the following components:    POC Glucose 354 (*)     All other components within normal limits   POCT GLUCOSE - Abnormal; Notable for the following components:    POC Glucose 349 (*)     All other components within normal limits   POCT GLUCOSE - Abnormal; Notable for the following components:    POC Glucose 276 (*)     All other components within normal limits   POCT GLUCOSE - Abnormal; Notable for the following components:    POC Glucose 281 (*)     All other components within normal limits   POCT GLUCOSE - Abnormal; Notable for the following components:    POC Glucose 243 (*)     All other components within normal limits   POCT GLUCOSE - Normal   BRAIN NATRIURETIC PEPTIDE   TROPONIN   LIPASE   MAGNESIUM   OSMOLALITY       All other labs were within normal range or not returned as of this dictation.  Please note, any cultures that may have been sent were not resulted at the time of this patient visit.    EMERGENCY DEPARTMENT COURSE andMedical Decision Making:     MDM  Number of Diagnoses or Management Options  Hyperglycemia  Diagnosis management comments: 45 year old female presents emergency room with complaint of hyperglycemia.  Patient states that she has been out of her medications for at least 6 weeks.  She  noticed increased thirst and urination.  Differential includes DKA, hyperglycemia, HHS, ischemia, infection, UTI, electrolyte abnormality.  Will evaluate with CBC, CMP, beta hydroxybutyrate, UA, lactate, procalcitonin, VBG.  Will start with IV hydration until her electrolytes have come back before starting any insulin.  /  ED Course as of 12/09/20 1313   Mon Dec 07, 2020   1039 Patient has mild elevated anion gap at 17 with beta hydroxybutyrate 8.65 however she does not have any acidosis.  Will give hydration with 2 L of normal saline.  We will also give insulin after potassium has been replaced.  Procalcitonin is negative I do not feel there is an infectious cause for her hyperglycemia.  No evidence of ischemia.  Most likely this is medication noncompliance in the setting of being unable to fill prescriptions. [DD]   1132 Heart rate has improved with hydration.  Urinalysis is negative for infection.  6 units of insulin given based on sliding scale.  Will recheck her glucose. [DD]      ED Course User Index  [DD] Rosaura Carpenter, MD         The patient was evaluated during the global COVID-19 pandemic, and that diagnosis was considered upon their initial presentation. Their evaluation, treatment and testing was consistent with current guidelines for patients who present with complaints or symptoms that may be related to COVID-19.    Strict returnprecautions and follow up instructions were discussed with the patient with which the patient agrees        ED Medications administered this visit:    Medications   0.9 % sodium chloride bolus (0 mLs IntraVENous Stopped 12/07/20 1020)   ondansetron (ZOFRAN) injection 4 mg (4 mg IntraVENous Given 12/07/20 0836)   potassium chloride 10 mEq/100 mL IVPB (Peripheral Line) (0  mEq IntraVENous Stopped 12/07/20 1141)   0.9 % sodium chloride bolus (0 mLs IntraVENous Stopped 12/07/20 1125)   insulin regular (HUMULIN R;NOVOLIN R) injection 6 Units (6 Units IntraVENous Given 12/07/20 1118)    ketorolac (TORADOL) injection 15 mg (15 mg IntraVENous Given 12/07/20 1123)   insulin regular (HUMULIN R;NOVOLIN R) injection 6 Units (6 Units IntraVENous Given 12/07/20 1256)         Procedures: (None if blank)       CLINICAL       1. Hyperglycemia          DISPOSITION/PLAN   DISPOSITION Decision To Discharge 12/07/2020 01:19:24 PM      PATIENT REFERRED TO:  Internal Medicine clinic  AT [facility] on wednesday at 1pm  Go in 2 days        DISCHARGE MEDICATIONS:  Discharge Medication List as of 12/07/2020  1:48 PM      START taking these medications    Details   metFORMIN (GLUCOPHAGE) 1000 MG tablet Take 1 tablet by mouth 2 times daily (with meals), Disp-60 tablet, R-0Normal      amLODIPine (NORVASC) 5 MG tablet Take 1 tablet by mouth daily, Disp-30 tablet, R-0Normal                    (Please note that portions of this note were completed with a voice recognition program.  Efforts were made to edit the dictations but occasionallywords are mis-transcribed.)      Electronically signed by Rosaura Carpenter, MD on 12/07/20 at 8:47 AM EDT    Attending Physician, Emergency Department       Rosaura Carpenter, MD  12/09/20 1313

## 2020-12-07 NOTE — ED Notes (Signed)
Patient arrived today via EMS. Patients blood sugar was 478 in ems. Patients blood sugar was 438 when taken upon arrival. Patient states she is not in pain. Patient states she is feeling really hot and that's how she knew she should come in. Patient states she hasn't taken her blood pressure medication in 2 weeks due to running out and has not taken any of her metformin in 2 weeks. Patient has just moved her from [facility]. Patient states she did not eat anything this morning. Patient has recently just changed from night shift to day shift.     Julienne Kass  12/07/20 0300              Julienne Kass  12/07/20 0801  Patient says she did have an appointment with a Doctor at the clinic this morning, due to not having a doctor to go to since her move here.     Julienne Kass  12/07/20 [phone]

## 2020-12-07 NOTE — Discharge Instructions (Signed)
You were seen for elevated blood sugar.  No evidence of diabetic ketoacidosis was found on evaluation today.  No evidence of infection or ischemia were found.  I have refilled your Norvasc and Metformin for 1 month.  I have also schedule an appointment for you on Wednesday at 1 PM at our internal medicine clinic.  Follow-up with your appointment as scheduled.  If you develop fever, chest pain, confusion, nausea, vomiting, or any other concerning symptoms please return to emergency room for reevaluation

## 2020-12-09 ENCOUNTER — Encounter: Attending: Student in an Organized Health Care Education/Training Program | Primary: Family
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
## 2020-12-09 NOTE — Telephone Encounter (Signed)
Received referral from [facility] for Athscl heart disease of native cor art w unsp ang pctrs. Called and spoke with patient to schedule appt, she does not have her work schedule at this time. Asked if we could contact her tomorrow, she will have her schedule.

## 2020-12-10 NOTE — Telephone Encounter (Signed)
lmovm for pt to call office

## 2020-12-11 LAB — COMPREHENSIVE METABOLIC PANEL
ALT: 32 U/L (ref 5–33)
AST: 26 U/L (ref <32)
Albumin/Globulin Ratio: 1.6 (ref 1.0–2.5)
Albumin: 3.9 g/dL (ref 3.5–5.2)
Alkaline Phosphatase: 76 U/L (ref 35–104)
Anion Gap: 10 mmol/L (ref 9–17)
BUN: 9 mg/dL (ref 6–20)
CO2: 24 mmol/L (ref 20–31)
Calcium: 9.2 mg/dL (ref 8.6–10.4)
Chloride: 100 mmol/L (ref 98–107)
Creatinine: 0.63 mg/dL (ref 0.50–0.90)
GFR African American: >60 mL/min (ref >60)
GFR Non-African American: >60 mL/min (ref >60)
Glucose: 250 mg/dL — ABNORMAL HIGH (ref 70–99)
Potassium: 4.2 mmol/L (ref 3.7–5.3)
Sodium: 134 mmol/L — ABNORMAL LOW (ref 135–144)
Total Bilirubin: 0.29 mg/dL — ABNORMAL LOW (ref 0.3–1.2)
Total Protein: 6.4 g/dL (ref 6.4–8.3)

## 2020-12-11 LAB — CBC WITH AUTO DIFFERENTIAL
Absolute Eos #: 0.11 10*3/uL (ref 0.00–0.44)
Absolute Immature Granulocyte: 0.03 10*3/uL (ref 0.00–0.30)
Absolute Lymph #: 2.19 10*3/uL (ref 1.10–3.70)
Absolute Mono #: 0.56 10*3/uL (ref 0.10–1.20)
Basophils Absolute: 0.06 10*3/uL (ref 0.00–0.20)
Basophils: 1 % (ref 0–2)
Eosinophils %: 2 % (ref 1–4)
Hematocrit: 40.9 % (ref 36.3–47.1)
Hemoglobin: 12.9 g/dL (ref 11.9–15.1)
Immature Granulocytes: 0 %
Lymphocytes: 32 % (ref 24–43)
MCH: 27.8 pg (ref 25.2–33.5)
MCHC: 31.5 g/dL (ref 28.4–34.8)
MCV: 88.1 fL (ref 82.6–102.9)
Monocytes: 8 % (ref 3–12)
NRBC Automated: 0 per 100 WBC
RBC: 4.64 m/uL (ref 3.95–5.11)
RDW: 13.8 % (ref 11.8–14.4)
Seg Neutrophils: 57 % (ref 36–65)
Segs Absolute: 3.92 10*3/uL (ref 1.50–8.10)
WBC: 6.9 10*3/uL (ref 3.5–11.3)

## 2020-12-11 LAB — IMMATURE CELLS
Platelet, Fluorescence: 257 10*3/uL (ref 138–453)
Platelet, Immature Fraction: 16.3 % — ABNORMAL HIGH (ref 1.1–10.3)

## 2020-12-11 LAB — LIPID PANEL
Chol/HDL Ratio: 5.7 — ABNORMAL HIGH (ref <5)
Cholesterol: 204 mg/dL — ABNORMAL HIGH (ref <200)
HDL: 36 mg/dL — ABNORMAL LOW (ref >40)
LDL Cholesterol: 122 mg/dL (ref 0–130)
Triglycerides: 232 mg/dL — ABNORMAL HIGH (ref <150)

## 2020-12-11 NOTE — Telephone Encounter (Signed)
LM for patient to return call.

## 2020-12-14 NOTE — Telephone Encounter (Signed)
Spoke to pt's alternate contact, Christina. Appt scheduled.

## 2021-01-01 ENCOUNTER — Emergency Department: Admit: 2021-01-02 | Payer: MEDICAID | Primary: Family

## 2021-01-01 DIAGNOSIS — R079 Chest pain, unspecified: Principal | ICD-10-CM

## 2021-01-01 NOTE — ED Notes (Signed)
Patient resting in bed. Respirations easy and unlabored. No distress noted. Call light within reach.\\  Pt denies any chest pain at this time  Repeat labs placed      Reginia Forts, [place]  01/01/21 2235

## 2021-01-01 NOTE — ED Provider Notes (Signed)
[facility] - [facility]  EMERGENCY DEPARTMENT ENCOUNTER          Pt Name: Melissa Cunningham  MRN: [id number]  Birthdate 10-Jul-1976  Date of evaluation: 01/01/2021  Emergency Physician: Milon Dikes, DO    CHIEF COMPLAINT       Chief Complaint   Patient presents with   ??? Chest Pain     History obtained from the patient.      HISTORY OF PRESENT ILLNESS    HPI  Melissa Cunningham is a 45 y.o. female who presents to the emergency department for evaluation of chest pain.  Patient reports she was at work this evening.  He states she was there for approximately 3 hours started getting clammy and then began having chest pain.  Chest pain was dull achy rated 4 out of 10.  Nonradiating nonexertional pain.  States that she took 2 of her home nitro and the pain was resolved.  This occurred 1 hour prior to arrival.  Patient arrived to the ED pain-free.  No shortness of breath or difficulty breathing.  No cough or congestion.  Patient with previous heart catheterization on 08/20/2020 no large stenosis and microvascular disease.  No intervention performed.  She is following with cardiology locally now.  The patient has no other acute complaints at this time.          REVIEW OF SYSTEMS   Review of Systems   Constitutional: Negative for chills, diaphoresis and fever.   HENT: Negative for congestion, rhinorrhea and sore throat.    Eyes: Negative for redness and visual disturbance.   Respiratory: Negative for cough, chest tightness, shortness of breath and wheezing.    Cardiovascular: Positive for chest pain. Negative for leg swelling.   Gastrointestinal: Negative for abdominal pain, constipation, diarrhea, nausea and vomiting.   Endocrine: Negative for polydipsia and polyuria.   Genitourinary: Negative for decreased urine volume, dysuria and urgency.   Musculoskeletal: Negative for back pain and myalgias.   Skin: Negative for rash and wound.   Neurological: Negative for light-headedness and headaches.   Hematological: Does  not bruise/bleed easily.   Psychiatric/Behavioral: Negative for decreased concentration and dysphoric mood.         PAST MEDICAL AND SURGICAL HISTORY   History reviewed. No pertinent past medical history.  History reviewed. No pertinent surgical history.      MEDICATIONS   No current facility-administered medications for this encounter.    Current Outpatient Medications:   ???  metFORMIN (GLUCOPHAGE) 1000 MG tablet, Take 1 tablet by mouth 2 times daily (with meals), Disp: 60 tablet, Rfl: 0  ???  amLODIPine (NORVASC) 5 MG tablet, Take 1 tablet by mouth daily, Disp: 30 tablet, Rfl: 0  ???  Magic Mouthwash (MIRACLE MOUTHWASH), Swish and spit 5 mLs 4 times daily as needed for Irritation 1:1:1, lidocaine, diphenhydramine, maalox, Disp: 240 mL, Rfl: 0      SOCIAL HISTORY     Social History     Social History Narrative   ??? Not on file     Social History     Tobacco Use   ??? Smoking status: Never Smoker   ??? Smokeless tobacco: Current User     Types: Chew   Substance Use Topics   ??? Alcohol use: Never   ??? Drug use: Not on file         ALLERGIES     Allergies   Allergen Reactions   ??? Morphine  FAMILY HISTORY   History reviewed. No pertinent family history.      PHYSICAL EXAM     ED Triage Vitals [01/01/21 2120]   BP Temp Temp Source Pulse Resp SpO2 Height Weight   130/76 98.3 ??F (36.8 ??C) Oral 101 17 100 % -- 245 lb (111.1 kg)         Additional Vital Signs:  Vitals:    01/02/21 0038   BP: 121/74   Pulse: 97   Resp: 19   Temp: 98.3 ??F (36.8 ??C)   SpO2: 100%       Physical Exam  Constitutional:       General: She is not in acute distress.     Appearance: She is well-developed. She is not diaphoretic.   HENT:      Head: Normocephalic and atraumatic.   Eyes:      General:         Right eye: No discharge.         Left eye: No discharge.      Conjunctiva/sclera: Conjunctivae normal.      Pupils: Pupils are equal, round, and reactive to light.   Neck:      Thyroid: No thyromegaly.      Vascular: No JVD.   Cardiovascular:      Rate  and Rhythm: Normal rate and regular rhythm.      Heart sounds: Normal heart sounds.   Pulmonary:      Effort: Pulmonary effort is normal. No respiratory distress.      Breath sounds: Normal breath sounds.   Abdominal:      General: Bowel sounds are normal. There is no distension.      Palpations: Abdomen is soft.      Tenderness: There is no abdominal tenderness.   Musculoskeletal:         General: No tenderness. Normal range of motion.      Cervical back: Normal range of motion and neck supple.   Lymphadenopathy:      Cervical: No cervical adenopathy.   Skin:     General: Skin is warm and dry.      Capillary Refill: Capillary refill takes less than 2 seconds.      Findings: No rash.   Neurological:      Mental Status: She is alert and oriented to person, place, and time. She is not disoriented.      GCS: GCS eye subscore is 4. GCS verbal subscore is 5. GCS motor subscore is 6.      Motor: No abnormal muscle tone or seizure activity.      Gait: Gait normal.      Comments: Awake and alert. Moves all extremities. Non-focal exam with steady gait.          Initial vital signs and nursing assessment reviewed and normal.   Pulsoximetry is normal per my interpretation.    MEDICAL DECISION MAKING   Initial Assessment: Given the patient's above chief complaint and findings on history and physical examination, I thought it was appropriate to consider the following emergency medical conditions: ACS, unstable angina, angina pneumonia, dissection, pneumothorax, anxiety, although some of these diagnoses are unlikely they were considered in my medical decision making.    Plan: CBC, BMP, ECG, troponin, dimer chest x-ray symptomatic         ED RESULTS   Laboratory results:  Labs Reviewed   BASIC METABOLIC PANEL W/ REFLEX TO MG FOR LOW K - Abnormal; Notable for the following components:  Result Value    Glucose 125 (*)     All other components within normal limits   CBC WITH AUTO DIFFERENTIAL - Abnormal; Notable for the  following components:    MCHC 31.4 (*)     All other components within normal limits   GLOMERULAR FILTRATION RATE, ESTIMATED - Abnormal; Notable for the following components:    Est, Glom Filt Rate 78 (*)     All other components within normal limits   BRAIN NATRIURETIC PEPTIDE   TROPONIN   ANION GAP   OSMOLALITY   D-DIMER, QUANTITATIVE   TROPONIN       Radiologic studies results:  XR CHEST PORTABLE   Final Result   Impression:   Normal chest.      This document has been electronically signed by: Vivi Martens, MD on    01/01/2021 10:21 PM          ED Medications administered this visit: Medications - No data to display      ED COURSE     ED Course as of 01/02/21 0650   Fri Jan 01, 2021   2234 EKG 12 Lead  ECG interpretation.     Patient with sinus rhythm.  Rate 84.  Left bundle branch similar to prior.  No acute ischemic change. [DD]   2235 Troponin T: < 0.010 [DD]   Sat Jan 02, 2021   6811 Patient was eager for discharge.  Repeat troponin negative.  Patient to follow-up with cardiology previously scheduled this week. [DD]      ED Course User Index  [DD] Milon Dikes, DO     Multiple etiologies were considered in the workup of this patient's complaint and presentation. The patient's chest pain is not suggestive of pulmonary embolus, cardiac ischemia, aortic dissection, or other serious etiology. The presentation is not consistent with these entities. Given the extremely low risk of these diagnoses further testing and evaluation for these possibilities in the Emergency Department today is not indicated at this time. Strict follow up and return precautions have been discussed with the patient and they agree.  The patient's HEART score is 4, moderate risk.  Patient established with cardiology.  Troponin was negative x2.  ECG  Unchanged. Shared decision making patient to follow-up with Cardiology as scheduled. The diagnosis, extensive differential diagnosis, laboratory and imaging findings were discussed at the  bedside.  The patient was an active participant in their care. They are agreeable to the plan of care. All questions and concerns were addressed at the time of the encounter.  MEDICATION CHANGES     DISCHARGE MEDICATIONS:  Discharge Medication List as of 01/02/2021  1:27 AM               FINAL DISPOSITION     Final diagnoses:   Chest pain, unspecified type     Condition: condition: good  Dispo: Discharge to home    PATIENT REFERRED TO:  Genice Rouge, APRN - CNP  [address]  [place] [address]  [phone]    Schedule an appointment as soon as possible for a visit in 3 days      [facility]  [address]  [place] [address]  Go to   As scheduled this week.      Critical Care Time   None      This transcription was electronically signed. Parts of this transcriptions may have been dictated by use of voice recognition software  and electronically transcribed, and parts may have been transcribed with the assistance of an ED scribe. The transcription may contain errors not detected in proofreading.    Electronically Signed: Milon Dikes, DO, 01/02/21, 6:35 AM      Milon Dikes, DO  01/02/21 [phone]

## 2021-01-01 NOTE — ED Triage Notes (Signed)
Pt arrives to ED from work with c/o chest pain that started at work about 45 minutes ago.   Pt was given 1 nitro in route, 1 aspirin given.  Pt arrives with chest pain relieved, does have nitro she keeps on her for the chronic angina

## 2021-01-02 ENCOUNTER — Inpatient Hospital Stay
Admit: 2021-01-02 | Discharge: 2021-01-02 | Disposition: A | Discharge status: Home / Self Care | Payer: MEDICAID | Attending: Emergency Medicine

## 2021-01-02 LAB — CBC WITH AUTO DIFFERENTIAL
Basophils Absolute: 0 10*3/uL (ref 0.0–0.1)
Basophils: 0.3 %
Eosinophils Absolute: 0.1 10*3/uL (ref 0.0–0.4)
Eosinophils: 1 %
Hematocrit: 39.5 % (ref 37.0–47.0)
Hemoglobin: 12.4 gm/dl (ref 12.0–16.0)
Immature Grans (Abs): 0.04 10*3/uL (ref 0.00–0.07)
Immature Granulocytes: 0.4 %
Lymphocytes Absolute: 2.4 10*3/uL (ref 1.0–4.8)
Lymphocytes: 25.5 %
MCH: 27.3 pg (ref 26.0–33.0)
MCHC: 31.4 gm/dl — ABNORMAL LOW (ref 32.2–35.5)
MCV: 87 fL (ref 81.0–99.0)
MPV: 11.7 fL (ref 9.4–12.4)
Monocytes Absolute: 0.6 10*3/uL (ref 0.4–1.3)
Monocytes: 6.5 %
Platelets: 285 10*3/uL (ref 130–400)
RBC: 4.54 10*6/uL (ref 4.20–5.40)
RDW-CV: 13.8 % (ref 11.5–14.5)
RDW-SD: 42.7 fL (ref 35.0–45.0)
Seg Neutrophils: 66.3 %
Segs Absolute: 6.2 10*3/uL (ref 1.8–7.7)
WBC: 9.4 10*3/uL (ref 4.8–10.8)
nRBC: 0 /100 wbc

## 2021-01-02 LAB — D-DIMER, QUANTITATIVE: D-Dimer, Quant: 488 ng/ml FEU (ref 0.00–500.00)

## 2021-01-02 LAB — EKG 12-LEAD
Atrial Rate: 84 {beats}/min
P Axis: 51 degrees
P-R Interval: 150 ms
Q-T Interval: 430 ms
QRS Duration: 146 ms
QTc Calculation (Bazett): 508 ms
R Axis: -19 degrees
T Axis: 47 degrees
Ventricular Rate: 84 {beats}/min

## 2021-01-02 LAB — GLOMERULAR FILTRATION RATE, ESTIMATED: Est, Glom Filt Rate: 78 mL/min/{1.73_m2} — AB

## 2021-01-02 LAB — BRAIN NATRIURETIC PEPTIDE: Pro-BNP: 222.3 pg/mL (ref 0.0–450.0)

## 2021-01-02 LAB — BASIC METABOLIC PANEL W/ REFLEX TO MG FOR LOW K
BUN: 13 mg/dL (ref 7–22)
CO2: 27 meq/L (ref 23–33)
Calcium: 8.7 mg/dL (ref 8.5–10.5)
Chloride: 99 meq/L (ref 98–111)
Creatinine: 0.8 mg/dL (ref 0.4–1.2)
Glucose: 125 mg/dL — ABNORMAL HIGH (ref 70–108)
Potassium reflex Magnesium: 3.9 meq/L (ref 3.5–5.2)
Sodium: 138 meq/L (ref 135–145)

## 2021-01-02 LAB — TROPONIN
Troponin T: <0.01 ng/ml
Troponin T: <0.01 ng/ml

## 2021-01-02 LAB — OSMOLALITY: Osmolality Calc: 277.3 mOsmol/kg (ref 275.0–300.0)

## 2021-01-02 LAB — ANION GAP: Anion Gap: 12 meq/L (ref 8.0–16.0)

## 2021-01-02 NOTE — Discharge Instructions (Signed)
Return to the ED at any time to resume care.  Return to ED if you have any new or changing symptoms such as chest pain, shortness of breath, difficulty breathing, or have any other concerns.

## 2021-01-02 NOTE — ED Notes (Signed)
ED nurse-to-nurse bedside report    Chief Complaint   Patient presents with   ??? Chest Pain      LOC: alert and orientated to name, place, date  Vital signs   Vitals:    01/01/21 2120 01/01/21 2235 01/02/21 0038   BP: 130/76 129/75 121/74   Pulse: 101 99 97   Resp: 17 18 19    Temp: 98.3 ??F (36.8 ??C) 98.2 ??F (36.8 ??C) 98.3 ??F (36.8 ??C)   TempSrc: Oral Oral Oral   SpO2: 100% 100% 100%   Weight: 245 lb (111.1 kg)        Pain:    Pain Interventions: MAR  Pain Goal: 0  Oxygen: No    Current needs required RA   Telemetry: Yes  LDAs:   Peripheral IV 12/07/20 Right Forearm (Active)       Peripheral IV 01/01/21 Right Forearm (Active)   Site Assessment Clean, dry & intact 01/01/21 2124     Continuous Infusions:   Mobility: Independent  Morse Fall Risk Score: No flowsheet data found.  Fall Interventions: call light shoes  Report given to: abby rn       2125, RN  01/02/21 [phone]

## 2021-01-02 NOTE — ED Notes (Signed)
Patient resting in bed. Respirations easy and unlabored. No distress noted. Call light within reach.  Pt denies any pain  Trop redrawn      Reginia Forts, RN  01/02/21 [phone]

## 2021-01-07 ENCOUNTER — Encounter

## 2021-01-11 ENCOUNTER — Encounter: Admit: 2021-01-11 | Discharge: 2021-01-11 | Payer: MEDICAID | Attending: Cardiovascular Disease | Primary: Family

## 2021-01-11 DIAGNOSIS — R0789 Other chest pain: Secondary | ICD-10-CM

## 2021-01-11 NOTE — Patient Instructions (Signed)
You may receive a survey regarding the care you received during your visit.  Your input is valuable to us.  We encourage you to complete and return your survey.  We hope you will choose us in the future for your healthcare needs.

## 2021-01-11 NOTE — Progress Notes (Signed)
Chief Complaint   Patient presents with   ??? 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 More on activity. freq 1 x/ week  No associated  sweating but sob    Complains of leg edema but on exam just trace  Sob on exertion    Dizziness on standing    Last EKG was done on: 01/01/2021       Patient is unsure of the medications she is on. Patient stated that she will go home and look at them to call them in so that we are able to update the list accordingly.     Chew tobacco    FHX  Adopted  Father side has heart ds and chf and ca  Father died of MI at age 29    Past Surgical History:   Procedure Laterality Date   ??? BREAST REDUCTION SURGERY Bilateral 2001       Allergies   Allergen Reactions   ??? Morphine         History reviewed. No pertinent family history.     Social History     Socioeconomic History   ??? Marital status: Single     Spouse name: Not on file   ??? Number of children: Not on file   ??? Years of education: Not on file   ??? Highest education level: Not on file   Occupational History   ??? Not on file   Tobacco Use   ??? Smoking status: Never Smoker   ??? Smokeless tobacco: Current User     Types: Chew   Substance and Sexual Activity   ??? Alcohol use: Never   ??? Drug use: Not on file   ??? Sexual activity: Not on file   Other Topics Concern   ??? Not on file   Social History Narrative   ??? Not on file     Social Determinants of Health     Financial Resource Strain:    ??? Difficulty of Paying Living Expenses: Not on file   Food Insecurity:    ??? Worried About Running Out of Food in the Last Year: Not on file   ??? Ran Out of Food in the Last Year: Not  on file   Transportation Needs:    ??? Lack of Transportation (Medical): Not on file   ??? Lack of Transportation (Non-Medical): Not on file   Physical Activity:    ??? Days of Exercise per Week: Not on file   ??? Minutes of Exercise per Session: Not on file   Stress:    ??? Feeling of Stress : Not on file   Social Connections:    ??? Frequency of Communication with Friends and Family: Not on file   ??? Frequency of Social Gatherings with Friends and Family: Not on file   ??? Attends Religious Services: Not on file   ??? Active Member of Clubs or Organizations: Not on file   ???  Attends Banker Meetings: Not on file   ??? Marital Status: Not on file   Intimate Partner Violence:    ??? Fear of Current or Ex-Partner: Not on file   ??? Emotionally Abused: Not on file   ??? Physically Abused: Not on file   ??? Sexually Abused: Not on file   Housing Stability:    ??? Unable to Pay for Housing in the Last Year: Not on file   ??? Number of Places Lived in the Last Year: Not on file   ??? Unstable Housing in the Last Year: Not on file       Current Outpatient Medications   Medication Sig Dispense Refill   ??? albuterol sulfate HFA 108 (90 Base) MCG/ACT inhaler Inhale 2 puffs into the lungs every 6 hours as needed     ??? amitriptyline (ELAVIL) 25 MG tablet Take by mouth nightly     ??? aspirin 81 MG EC tablet Take by mouth 5 times daily     ??? atorvastatin (LIPITOR) 10 MG tablet TAKE ONE TABLET BY MOUTH ONCE DAILY     ??? busPIRone (BUSPAR) 15 MG tablet TAKE ONE TABLET BY MOUTH THREE TIMES DAILY AS NEEDED FOR ANXIETY     ??? clopidogrel (PLAVIX) 75 MG tablet Take by mouth daily (with breakfast)     ??? furosemide (LASIX) 20 MG tablet Take 20 mg by mouth 2 times daily     ??? hydrALAZINE (APRESOLINE) 25 MG tablet TAKE 1 TABLET (25 MG TOTAL) BY MOUTH EVERY SIX HOURS.     ??? metFORMIN (GLUCOPHAGE) 1000 MG tablet Take 1 tablet by mouth 2 times daily (with meals) 60 tablet 0   ??? amLODIPine (NORVASC) 5 MG tablet Take 1 tablet by mouth daily 30 tablet 0     No current  facility-administered medications for this visit.       Review of Systems -     General ROS: negative  Psychological ROS: negative  Hematological and Lymphatic ROS: No history of blood clots or bleeding disorder.   Respiratory ROS: no cough,  or wheezing, the rest see HPI  Cardiovascular ROS: See HPI  Gastrointestinal ROS: negative  Genito-Urinary ROS: no dysuria, trouble voiding, or hematuria  Musculoskeletal ROS: negative  Neurological ROS: no TIA or stroke symptoms  Dermatological ROS: negative      Blood pressure (!) 146/84, pulse 81, height 5' (1.524 m), weight 242 lb 3.2 oz (109.9 kg).        Physical Examination:    General appearance - alert, well appearing, and in no distress  HEENT- Pink conjunctiva  , Non-icteri sclera,PERRLA  Mental status - alert, oriented to person, place, and time  Neck - supple, no significant adenopathy, no JVD, or carotid bruits  Chest - clear to auscultation, no wheezes, rales or rhonchi, symmetric air entry  Heart - normal rate, regular rhythm, normal S1, S2, no murmurs, rubs, clicks or gallops  Abdomen - soft, nontender, nondistended, no masses or organomegaly  GUS- no CVA or flank tenderness, no suprapubic tenderness  Neurological - alert, oriented, normal speech, no focal findings or movement disorder noted  Musculoskeletal/limbs - no joint tenderness, deformity or swelling   - peripheral pulses normal, no pedal edema, no clubbing or cyanosis  Skin - normal coloration and turgor, no rashes, no suspicious skin lesions noted  Psych- appropriate mood and affect    Lab  No results for input(s): CKTOTAL, CKMB, CKMBINDEX, TROPONINI in the last 72 hours.  CBC:  Lab Results   Component Value Date    WBC 9.4 01/01/2021    RBC 4.54 01/01/2021    HGB 12.4 01/01/2021    HCT 39.5 01/01/2021    MCV 87.0 01/01/2021    MCH 27.3 01/01/2021    MCHC 31.4 01/01/2021    RDW 13.8 12/10/2020    PLT 285 01/01/2021    MPV 11.7 01/01/2021     BMP:    Lab Results   Component Value Date    NA 138  01/01/2021    K 3.9 01/01/2021    CL 99 01/01/2021    CO2 27 01/01/2021    BUN 13 01/01/2021    LABALBU 3.9 12/10/2020    CREATININE 0.8 01/01/2021    CALCIUM 8.7 01/01/2021    GFRAA >60 12/10/2020    LABGLOM 78 01/01/2021    GLUCOSE 125 01/01/2021     Hepatic Function Panel:    Lab Results   Component Value Date    ALKPHOS 76 12/10/2020    ALT 32 12/10/2020    AST 26 12/10/2020    PROT 6.4 12/10/2020    BILITOT 0.29 12/10/2020    LABALBU 3.9 12/10/2020     Magnesium:    Lab Results   Component Value Date    MG 1.7 12/07/2020     Warfarin PT/INR:  No components found for: PTPATWAR, PTINRWAR  HgBA1c:  No results found for: LABA1C  FLP:    Lab Results   Component Value Date    TRIG 232 12/10/2020    HDL 36 12/10/2020     TSH:  No results found for: TSH  Cath dec 2021  Pt is here for: np, 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       Patient is unsure of the medications she is on. Patient stated that she will go home and look at them to call them in so that we are able to update the list accordingly.       Normal sinus rhythm Left bundle branch block Abnormal ECG When compared with ECG of 07-Dec-2020 07:47, No signiicant changes noted        Echo  Dec 2021  IMPRESSIONS     ??1. Left ventricular ejection fraction, by estimation, is 45 to 50%. The left ventricle has mildly decreased function. The left ventricle demonstrates regional wall motion abnormalities (see scoring diagram/findings for description). There is mild   concentric left ventricular hypertrophy. Left ventricular diastolic parameters are consistent with??Grade I diastolic dysfunction (impaired relaxation). There is moderate hypokinesis of the left ventricular, basal-mid anterior wall. There is mild   paradoxical of the left ventricular, entire septal wall.   ??2. Right ventricular systolic function is  low??normal. The right ventricular size is normal.   ??3. Left atrial size was mildly dilated.   ??4. The mitral valve is normal in structure. Trivial mitral valve regurgitation.   ??5. The aortic valve is tricuspid. Aortic valve regurgitation is not visualized. Mild aortic valve sclerosis is present, with no evidence of aortic valve stenosis.   ??6. There is mild (Grade II) atheroma plaque involving the aortic root.   ??7. The inferior vena cava is normal in size with <50% respiratory variability, suggesting right atrial pressure of 8 mmHg.       Assessment  Trace leg edema and ankle edema  Diagnosis Orders   1. Chest pain, atypical     2. SOB (shortness of breath) on exertion     3. Chronic diastolic congestive heart failure (HCC)     4. Primary hypertension     5. Pure hypercholesterolemia     6. Morbid obesity due to excess calories (HCC)           Plan     Continue the current treatment and with constant vigilance to changes in symptoms and also any potential side effects.  Return for care or seek medical attention immediately if symptoms got worse and/or develop new symptoms.     chest pain- atypical noncardiac  Has been in ER few times in 39months  Some chest wall tenderness  Sob on exertion  Limited echo    Congestive heart failure: no evidence of fluid overload today, no recent hospitalization for CHF  Trace leg edema and ankle edema    Hypertension, on medical treatment. Seems to be under good control. Patient is compliant with medical treatment.      Hyperlipidemia: on statins, followed periodically. Patient need periodic lipid and liver profile.    To update [place] with med from home    Discussed use, benefit, and side effects of prescribed medications. All patient questions answered. Pt voiced understanding. Instructed to continue current medications, diet and exercise. Continue risk factor modification and medical management. Patient agreed with treatment plan. Follow up as directed.         RTC in 3  months        Rhona Leavens, [facility]

## 2021-01-12 ENCOUNTER — Encounter

## 2021-01-12 NOTE — Telephone Encounter (Signed)
Left voicemail for patient to return call to schedule limited echo.

## 2021-01-14 ENCOUNTER — Inpatient Hospital Stay: Admit: 2021-01-14 | Discharge: 2021-01-14 | Disposition: A | Discharge status: Home / Self Care | Payer: MEDICAID

## 2021-01-14 DIAGNOSIS — U071 COVID-19: Secondary | ICD-10-CM

## 2021-01-14 LAB — COVID-19, RAPID: SARS-CoV-2, NAA: DETECTED — CR

## 2021-01-14 MED ORDER — AMOXICILLIN 500 MG PO CAPS
500 MG | ORAL_CAPSULE | Freq: Three times a day (TID) | ORAL | 0 refills | Status: AC
Start: 2021-01-14 — End: 2021-01-24

## 2021-01-14 NOTE — ED Triage Notes (Signed)
Pt to [facility] ambulatory with a headache, left ear pain, and wanting COVID-19 testing.  Pt has been exposed to COVID-19.

## 2021-01-14 NOTE — ED Notes (Signed)
Patient called out from room 4 and advised she had blood tinged sputum with cough.  Visible blood in tissue.  Leta Baptist NP notified.     Lolita Cram, RN  01/14/21 1754

## 2021-01-14 NOTE — ED Provider Notes (Signed)
[facility]  Urgent Care Encounter       CHIEF COMPLAINT       Chief Complaint   Patient presents with   ??? Covid Testing   ??? Headache   ??? Otalgia     left       Nurses Notes reviewed and I agree except as noted in the HPI.  HISTORY OF PRESENT ILLNESS   Melissa Cunningham is a 45 y.o. female who presents to the urgent care center complaining of left ear pain generalized body aches headache and exposure to COVID.  Patient states that a large number of people at her work all have positive COVID.  She works at a [employer]  The history is provided by the patient. No language interpreter was used.   Ear Problem  Location:  Left  Behind ear:  No abnormality  Severity:  Mild  Onset quality:  Sudden  Duration:  3 days  Timing:  Rare  Progression:  Unchanged  Chronicity:  New  Relieved by:  Nothing  Worsened by:  Nothing  Ineffective treatments:  OTC medications  Associated symptoms: headaches    Associated symptoms: no congestion, no cough, no diarrhea, no fever, no neck pain, no rash, no rhinorrhea, no sore throat and no vomiting    Headaches:     Severity:  Mild    Onset quality:  Gradual    Duration:  3 days    Timing:  Constant    Progression:  Unchanged    Chronicity:  New      REVIEW OF SYSTEMS     Review of Systems   Constitutional: Positive for chills. Negative for activity change, appetite change, fatigue and fever.   HENT: Positive for ear pain. Negative for congestion, rhinorrhea, sinus pressure, sore throat and trouble swallowing.    Respiratory: Negative for cough and shortness of breath.    Cardiovascular: Negative for chest pain.   Gastrointestinal: Negative for diarrhea, nausea and vomiting.   Musculoskeletal: Negative for back pain, neck pain and neck stiffness.   Skin: Negative for rash.   Allergic/Immunologic: Negative for environmental allergies.   Neurological: Positive for headaches. Negative for dizziness and light-headedness.   Hematological: Negative for adenopathy.        PAST MEDICAL HISTORY         Diagnosis Date   ??? Diabetes (HCC)        SURGICALHISTORY     Patient  has a past surgical history that includes Breast reduction surgery (Bilateral, 2001).    CURRENT MEDICATIONS       Discharge Medication List as of 01/14/2021  5:51 PM      CONTINUE these medications which have NOT CHANGED    Details   albuterol sulfate HFA 108 (90 Base) MCG/ACT inhaler Inhale 2 puffs into the lungs every 6 hours as neededHistorical Med      amitriptyline (ELAVIL) 25 MG tablet Take by mouth nightlyHistorical Med      aspirin 81 MG EC tablet Take by mouth 5 times dailyHistorical Med      atorvastatin (LIPITOR) 10 MG tablet TAKE ONE TABLET BY MOUTH ONCE DAILYHistorical Med      busPIRone (BUSPAR) 15 MG tablet TAKE ONE TABLET BY MOUTH THREE TIMES DAILY AS NEEDED FOR ANXIETYHistorical Med      clopidogrel (PLAVIX) 75 MG tablet Take by mouth daily (with breakfast)Historical Med      furosemide (LASIX) 20 MG tablet Take 20 mg by mouth 2  times dailyHistorical Med      hydrALAZINE (APRESOLINE) 25 MG tablet TAKE 1 TABLET (25 MG TOTAL) BY MOUTH EVERY SIX HOURS.Historical Med      amLODIPine (NORVASC) 5 MG tablet Take 1 tablet by mouth daily, Disp-30 tablet, R-0Normal             ALLERGIES     Patient is is allergic to morphine.    Patients   There is no immunization history on file for this patient.    FAMILY HISTORY     Patient's family history is not on file.    SOCIAL HISTORY     Patient  reports that she has never smoked. Her smokeless tobacco use includes chew. She reports that she does not drink alcohol and does not use drugs.    PHYSICAL EXAM     ED TRIAGE VITALS  BP: 139/76, Temp: 98.8 ??F (37.1 ??C), Pulse: 72, Resp: 16, SpO2: 97 %,Estimated body mass index is 47.46 kg/m?? as calculated from the following:    Height as of this encounter: 5' (1.524 m).    Weight as of this encounter: 243 lb (110.2 kg).,Patient's last menstrual period was 12/21/2020.    Physical Exam  Vitals and nursing note reviewed.    Constitutional:       General: She is not in acute distress.     Appearance: Normal appearance. She is well-developed and well-groomed. She is not ill-appearing, toxic-appearing or diaphoretic.   HENT:      Head: Normocephalic.      Right Ear: Hearing, tympanic membrane, ear canal and external ear normal. No drainage, swelling or tenderness. No mastoid tenderness. Tympanic membrane is not erythematous.      Left Ear: Hearing, ear canal and external ear normal. No drainage, swelling or tenderness. A middle ear effusion is present. No mastoid tenderness. Tympanic membrane is erythematous.      Nose: Nose normal. No congestion or rhinorrhea.      Right Sinus: No maxillary sinus tenderness or frontal sinus tenderness.      Left Sinus: No maxillary sinus tenderness or frontal sinus tenderness.      Mouth/Throat:      Lips: Pink.      Mouth: Mucous membranes are moist.      Pharynx: Uvula midline. No posterior oropharyngeal erythema or uvula swelling.      Tonsils: No tonsillar exudate or tonsillar abscesses.   Eyes:      Conjunctiva/sclera: Conjunctivae normal.      Pupils: Pupils are equal, round, and reactive to light.   Cardiovascular:      Rate and Rhythm: Normal rate and regular rhythm.      Heart sounds: Normal heart sounds.   Pulmonary:      Effort: Pulmonary effort is normal. No accessory muscle usage.      Breath sounds: Normal breath sounds. No decreased breath sounds, wheezing, rhonchi or rales.   Chest:   Breasts:      Right: No supraclavicular adenopathy.      Left: No supraclavicular adenopathy.       Abdominal:      General: Bowel sounds are normal.      Palpations: Abdomen is soft.      Tenderness: There is no abdominal tenderness. There is no right CVA tenderness, left CVA tenderness or guarding. Negative signs include Murphy's sign.   Musculoskeletal:      Cervical back: Full passive range of motion without pain and normal range of motion. No rigidity.  Lymphadenopathy:      Head:      Right side of  head: No submental, submandibular, tonsillar, preauricular, posterior auricular or occipital adenopathy.      Left side of head: No submental, submandibular, tonsillar, preauricular, posterior auricular or occipital adenopathy.      Cervical: No cervical adenopathy.      Right cervical: No superficial, deep or posterior cervical adenopathy.     Left cervical: No superficial, deep or posterior cervical adenopathy.      Upper Body:      Right upper body: No supraclavicular adenopathy.      Left upper body: No supraclavicular adenopathy.   Skin:     General: Skin is warm and dry.      Capillary Refill: Capillary refill takes less than 2 seconds.      Findings: No rash.   Neurological:      Mental Status: She is alert and oriented to person, place, and time.   Psychiatric:         Mood and Affect: Mood normal.         Behavior: Behavior normal. Behavior is cooperative.         DIAGNOSTIC RESULTS     Labs:  Results for orders placed or performed during the hospital encounter of 01/14/21   COVID-19, Rapid   Result Value Ref Range    SARS-CoV-2, NAA DETECTED (AA) NOT DETECTED       IMAGING:    No orders to display         EKG:      URGENT CARE COURSE:     Vitals:    01/14/21 1724 01/14/21 1755   BP: (!) 165/75 139/76   Pulse: 74 72   Resp: 18 16   Temp: 98.8 ??F (37.1 ??C)    TempSrc: Temporal    SpO2: 95% 97%   Weight: 243 lb (110.2 kg)    Height: 5' (1.524 m)        Medications - No data to display         PROCEDURES:  None    FINAL IMPRESSION      1. COVID-19    2. Exposure to COVID-19 virus    3. Left otitis media with effusion          DISPOSITION/ PLAN      I did discuss clinical findings with the patient as well as vital signs in assessment findings. Patient/Patient representative was advised they have otitis media. Patient is afebrile and stable. The patient/Patient representative was advised to continue with Motrin and Tylenol for pain and discomfort.  The patient/Patient representative was also advised to monitor  for any changes such as development of fever, drainage from the ear, redness or tenderness to the outer ear or the behind ear.  They're also to monitor for any stiffness of the neck, vertigo, hearing loss or tinnitus.   Advised to follow up with family doctor in the next 2-3 days for reevaluation.  The patient may return to urgent care if does not get better or symptoms worsen.  However the patient is advised to go to ER immediately if present symptoms worsen, high fever >102 , Ear pain, lethargy or new symptoms develop.    Patient/ parents understands this approach of home management and agrees to the treatment plan.      PATIENT REFERRED TO:  Genice Rouge, APRN - CNP  [address] / [place] [address]  DISCHARGE MEDICATIONS:  Discharge Medication List as of 01/14/2021  5:51 PM      START taking these medications    Details   amoxicillin (AMOXIL) 500 MG capsule Take 1 capsule by mouth 3 times daily for 10 days, Disp-30 capsule, R-0Normal             Discharge Medication List as of 01/14/2021  5:51 PM      STOP taking these medications       metFORMIN (GLUCOPHAGE) 1000 MG tablet Comments:   Reason for Stopping:               Discharge Medication List as of 01/14/2021  5:51 PM          Marcell Anger, APRN - CNP    (Please note that portions of this note were completed with a voice recognition program. Efforts were made to edit the dictations but occasionally words are mis-transcribed.)           Marcell Anger, APRN - CNP  01/14/21 1835

## 2021-01-15 NOTE — Care Coordination-Inpatient (Signed)
Patient contacted regarding COVID-19 exposure and diagnosis. Discussed COVID-19 related testing which was available at this time. Test results were positive. Patient informed of results, if available? Patient aware of positive results prior to ACM f/u call being completed.      Ambulatory Care Manager contacted the patient by telephone to perform post discharge assessment. Call within 2 business days of discharge: Yes. Verified name and DOB with patient as identifiers. Provided introduction to self, and explanation of the CTN/ACM role, and reason for call due to risk factors for infection and/or exposure to COVID-19.     Symptoms reviewed with patient who verbalized the following symptoms: fatigue  pain or aching joints  no new symptoms  no worsening symptoms  headache and recent COVID-19 exposure .      Due to no new or worsening symptoms encounter was not routed to provider for escalation. Discussed follow-up appointments. If no appointment was previously scheduled, appointment scheduling offered: Patient will call and schedule f/u on her own and she denied any need for assistance.  [facility] follow up appointment(s):   Future Appointments   Date Time Provider Department Center   01/15/2021  9:30 AM STR VASCULAR LAB ROOM 2 [facility] STR [facility]   04/26/2021  3:45 PM Rhona Leavens, MD [facility] [place]     Non-[facility] follow up appointment(s): N/A     Non-face-to-face services provided:  Obtained and reviewed discharge summary and/or continuity of care documents  Education of patient/family/caregiver/guardian to support self-management-COVID-19 education and zone information reviewed with patient.  Patient educated on signs/symptoms to report as well as the importance of early symptom recognition and follow up.  ACM educated on need to self quarantine as directed as well as importance of following up as instructed.  Patient verbalized understanding.      Advance Care Planning:   Does patient have an Advance  Directive:  patient declined education.     Educated patient about risk for severe COVID-19 due to risk factors according to CDC guidelines. ACM reviewed discharge instructions, medical action plan and red flag symptoms with the patient who verbalized understanding. Discussed COVID vaccination status: No.  Discussed exposure protocols and quarantine with CDC Guidelines. Patient was given an opportunity to verbalize any questions and concerns and agrees to contact ACM or health care provider for questions related to their healthcare.    Reviewed and educated patient on any new and changed medications related to discharge diagnosis     Was patient discharged with a pulse oximeter? No      ACM provided contact information. No further follow-up call identified based on severity of symptoms and risk factors.    Patient called for covid-19 f/u s/p recent UC visit for c/o ear pain, body aches, headache, and recent COVID-19 exposure.  Patient denied any new/change/worsening of symptoms.  Patient reported she is doing ok today and she will call to have antibiotic delivered.  Patient denied any questions re: her discharge instructions.  COVID-19 education and zone information was reviewed with patient.  Patient was educated on signs/symptoms to report as well as the importance of early symptom recognition.  Patient verbalized understanding.  ACM reviewed need to self quarantine as directed and patient acknowledged understanding.  Patient was reminded to call covid-19 hotline number with any new symptoms or questions/concerns.  Patient verbalized understanding.  Patient denied any other questions, concerns, or needs.  No further follow up planned and patient is aware of need to follow up  with any change of symptoms and/or ongoing questions/concerns.  Episode of Care resolved.

## 2021-01-27 NOTE — Telephone Encounter (Signed)
Spoke with Everlean Cherry, patient's HIPAA contact, to advises of earlier arrival time for echo (10:00 am) on 02/05/21, for previously scheduled ABI's. Christina verbalized understanding.

## 2021-02-05 ENCOUNTER — Ambulatory Visit: Payer: MEDICAID | Primary: Family

## 2021-02-09 NOTE — Telephone Encounter (Signed)
Patient no-showed for echo on 02/05/21; many messages were left for patient regarding echo scheduling, patient never returned calls. Patient's HIPAA contact, Trula Ore, was eventually notified via phone of appt, but patient still no-showed.    No-show letter mailed to patient this date. Order will need reprinted from encounter on 01/11/21 if patient calls to r/s.

## 2021-04-26 ENCOUNTER — Encounter: Payer: MEDICAID | Attending: Cardiovascular Disease | Primary: Family

## 2021-04-30 ENCOUNTER — Emergency Department: Primary: Family

## 2021-04-30 ENCOUNTER — Inpatient Hospital Stay
Admit: 2021-04-30 | Discharge: 2021-05-01 | Discharge status: Against Medical Advice | Attending: Student in an Organized Health Care Education/Training Program

## 2021-04-30 ENCOUNTER — Emergency Department: Admit: 2021-04-30 | Primary: Family

## 2021-04-30 DIAGNOSIS — H539 Unspecified visual disturbance: Secondary | ICD-10-CM

## 2021-04-30 LAB — CBC WITH AUTO DIFFERENTIAL
Basophils Absolute: 0 10*3/uL (ref 0.0–0.1)
Basophils: 0.2 %
Eosinophils Absolute: 0.1 10*3/uL (ref 0.0–0.4)
Eosinophils: 0.7 %
Hematocrit: 41 % (ref 37.0–47.0)
Hemoglobin: 13.2 gm/dl (ref 12.0–16.0)
Immature Grans (Abs): 0.06 10*3/uL (ref 0.00–0.07)
Immature Granulocytes: 0.5 %
Lymphocytes Absolute: 2 10*3/uL (ref 1.0–4.8)
Lymphocytes: 16.3 %
MCH: 27.3 pg (ref 26.0–33.0)
MCHC: 32.2 gm/dl (ref 32.2–35.5)
MCV: 84.9 fL (ref 81.0–99.0)
MPV: 11.7 fL (ref 9.4–12.4)
Monocytes Absolute: 0.8 10*3/uL (ref 0.4–1.3)
Monocytes: 6.2 %
Platelets: 312 10*3/uL (ref 130–400)
RBC: 4.83 10*6/uL (ref 4.20–5.40)
RDW-CV: 13.9 % (ref 11.5–14.5)
RDW-SD: 42.6 fL (ref 35.0–45.0)
Seg Neutrophils: 76.1 %
Segs Absolute: 9.4 10*3/uL — ABNORMAL HIGH (ref 1.8–7.7)
WBC: 12.3 10*3/uL — ABNORMAL HIGH (ref 4.8–10.8)
nRBC: 0 /100 wbc

## 2021-04-30 LAB — BASIC METABOLIC PANEL W/ REFLEX TO MG FOR LOW K
BUN: 11 mg/dL (ref 7–22)
CO2: 27 meq/L (ref 23–33)
Calcium: 9.8 mg/dL (ref 8.5–10.5)
Chloride: 100 meq/L (ref 98–111)
Creatinine: 0.6 mg/dL (ref 0.4–1.2)
Glucose: 114 mg/dL — ABNORMAL HIGH (ref 70–108)
Potassium reflex Magnesium: 3.8 meq/L (ref 3.5–5.2)
Sodium: 139 meq/L (ref 135–145)

## 2021-04-30 LAB — ANION GAP: Anion Gap: 12 meq/L (ref 8.0–16.0)

## 2021-04-30 LAB — GLOMERULAR FILTRATION RATE, ESTIMATED: Est, Glom Filt Rate: >90 mL/min/{1.73_m2}

## 2021-04-30 MED ORDER — KETOROLAC TROMETHAMINE 30 MG/ML IJ SOLN
30 MG/ML | Freq: Once | INTRAMUSCULAR | Status: AC
Start: 2021-04-30 — End: 2021-04-30
  Administered 2021-04-30: 23:00:00 30 mg via INTRAVENOUS

## 2021-04-30 MED ORDER — LORAZEPAM 2 MG/ML IJ SOLN
2 MG/ML | Freq: Once | INTRAMUSCULAR | Status: AC
Start: 2021-04-30 — End: 2021-04-30
  Administered 2021-04-30: 23:00:00 0.5 mg via INTRAVENOUS

## 2021-04-30 MED ORDER — SODIUM CHLORIDE 0.9 % IV BOLUS
0.9 % | Freq: Once | INTRAVENOUS | Status: AC
Start: 2021-04-30 — End: 2021-04-30
  Administered 2021-04-30: 23:00:00 1000 mL via INTRAVENOUS

## 2021-04-30 MED ORDER — DROPERIDOL 2.5 MG/ML IJ SOLN
2.5 MG/ML | Freq: Four times a day (QID) | INTRAMUSCULAR | Status: DC | PRN
Start: 2021-04-30 — End: 2021-04-30
  Administered 2021-04-30: 23:00:00 1.25 mg via INTRAMUSCULAR

## 2021-04-30 MED FILL — KETOROLAC TROMETHAMINE 30 MG/ML IJ SOLN: 30 mg/mL | INTRAMUSCULAR | Qty: 1

## 2021-04-30 MED FILL — DROPERIDOL 2.5 MG/ML IJ SOLN: 2.5 mg/mL | INTRAMUSCULAR | Qty: 2

## 2021-04-30 MED FILL — LORAZEPAM 2 MG/ML IJ SOLN: 2 mg/mL | INTRAMUSCULAR | Qty: 1

## 2021-04-30 NOTE — ED Provider Notes (Signed)
[facility]  EMER[facility]NCOUNTER          Pt Name: Melissa Cunningham  MRN: [id number]  Birthdate 04/21/1976  Date of evaluation: 04/30/2021  Treating Resident Physician: Grayland OrmondJeremy Talani Brazee, MD  Supervising Physician: Dr. Val RilesMohammadieh    History obtained from the patient.    CHIEF COMPLAINT       Chief Complaint   Patient presents with    Dizziness           HISTORY OF PRESENT ILLNESS    HPI  Melissa Cunningham is a 45 y.o. female who presents to the emergency department for evaluation of vision changes and dizziness.    Patient states that approximately 5:10 PM at work today she started experiencing green flashing floating lights in her vision that was causing some blurriness accompanied with a lightheaded sensation.  Patient states lightheadedness has resolved however she is still having the visual symptoms.  Patient also notes that she is having new onset bitemporal headache that is throbbing in quality.  Patient rates it 3 out of 10 in intensity.  Patient says she does not regularly get headaches and does not have a diagnosis of migraines in the past.  Patient denies any recent traumas falls car accidents or otherwise.  Patient says that she is not feeling nauseous and has not vomited.  No fevers or chills.  No shortness of breath cough congestion.  No chest pain or belly pain or back pain.  Patient was initially not going to come and however her boss at work made her come in to be evaluated.    Patient states that she does have a family history of multiple sclerosis in multiple members of the family.  The patient has no other acute complaints at this time.          REVIEW OF SYSTEMS   Review of Systems   Constitutional:  Negative for chills, fatigue, fever and unexpected weight change.   HENT:  Negative for ear pain and hearing loss.    Eyes:  Positive for visual disturbance. Negative for photophobia and pain.   Respiratory:  Negative for chest tightness and shortness of breath.     Cardiovascular:  Negative for chest pain and leg swelling.   Gastrointestinal:  Negative for blood in stool, constipation, diarrhea, nausea and vomiting.   Endocrine: Negative for cold intolerance and heat intolerance.   Genitourinary:  Negative for difficulty urinating, dysuria, hematuria and vaginal bleeding.   Musculoskeletal:  Negative for arthralgias and back pain.   Neurological:  Positive for light-headedness and headaches. Negative for dizziness and numbness.   Psychiatric/Behavioral:  Negative for agitation, confusion and sleep disturbance.        PAST MEDICAL AND SURGICAL HISTORY     Past Medical History:   Diagnosis Date    Diabetes [facility](HCC)      Past Surgical History:   Procedure Laterality Date    BREAST REDUCTION SURGERY Bilateral 2001         MEDICATIONS     Current Facility-Administered Medications:     droperidol (INAPSINE) injection 1.25 mg, 1.25 mg, IntraMUSCular, Q6H PRN, Bartholomew BoardsJeremy A Lynann Demetrius, MD, 1.25 mg at 04/30/21 1859    Current Outpatient Medications:     albuterol sulfate HFA 108 (90 Base) MCG/ACT inhaler, Inhale 2 puffs into the lungs every 6 hours as needed, Disp: , Rfl:     amitriptyline (ELAVIL) 25 MG tablet, Take by mouth nightly, Disp: , Rfl:  aspirin 81 MG EC tablet, Take by mouth 5 times daily, Disp: , Rfl:     atorvastatin (LIPITOR) 10 MG tablet, TAKE ONE TABLET BY MOUTH ONCE DAILY, Disp: , Rfl:     busPIRone (BUSPAR) 15 MG tablet, TAKE ONE TABLET BY MOUTH THREE TIMES DAILY AS NEEDED FOR ANXIETY, Disp: , Rfl:     clopidogrel (PLAVIX) 75 MG tablet, Take by mouth daily (with breakfast), Disp: , Rfl:     furosemide (LASIX) 20 MG tablet, Take 20 mg by mouth 2 times daily, Disp: , Rfl:     hydrALAZINE (APRESOLINE) 25 MG tablet, TAKE 1 TABLET (25 MG TOTAL) BY MOUTH EVERY SIX HOURS., Disp: , Rfl:     amLODIPine (NORVASC) 5 MG tablet, Take 1 tablet by mouth daily, Disp: 30 tablet, Rfl: 0      SOCIAL HISTORY     Social History     Social History Narrative    Not on file     Social History      Tobacco Use    Smoking status: Never    Smokeless tobacco: Current     Types: Chew   Vaping Use    Vaping Use: Never used   Substance Use Topics    Alcohol use: Never    Drug use: Never         ALLERGIES     Allergies   Allergen Reactions    Morphine          FAMILY HISTORY   No family history on file.      PREVIOUS RECORDS   Previous records reviewed: Patient was last seen emergency department May for 2022 for COVID        PHYSICAL EXAM     ED Triage Vitals [04/30/21 1744]   BP Temp Temp Source Heart Rate Resp SpO2 Height Weight   (!) 170/91 98 ??F (36.7 ??C) Oral 81 17 99 % 5' (1.524 m) 243 lb (110.2 kg)     Initial vital signs and nursing assessment reviewed and normal. Body mass index is 47.46 kg/m??. Pulsoximetry is normal per my interpretation.    Additional Vital Signs:  Vitals:    04/30/21 1910   BP:    Pulse: 78   Resp: 16   Temp:    SpO2: 98%       Physical Exam  Vitals and nursing note reviewed.   Constitutional:       Appearance: She is obese.   HENT:      Head: Normocephalic and atraumatic.      Mouth/Throat:      Mouth: Mucous membranes are moist.      Pharynx: Oropharynx is clear.   Eyes:      Extraocular Movements: Extraocular movements intact.      Pupils: Pupils are equal, round, and reactive to light.   Cardiovascular:      Rate and Rhythm: Normal rate and regular rhythm.      Pulses: Normal pulses.      Heart sounds: Normal heart sounds.   Pulmonary:      Effort: Pulmonary effort is normal.      Breath sounds: Normal breath sounds.   Abdominal:      General: Bowel sounds are normal.      Palpations: Abdomen is soft.   Skin:     General: Skin is warm and dry.   Neurological:      General: No focal deficit present.      Mental Status: She is alert  and oriented to person, place, and time.           MEDICAL DECISION MAKING   Initial Assessment:   Patient is a 45 year old female presenting with acute onset of green flashing lights in her right eye.  Patient with family history of MS.  Physical exam  document as above normally there is no abnormal findings patient's visual acuity is intact and the patient does not have any focal neurological deficits.  Differential diagnosis for this patient includes retinal detachment, optic neuritis, multiple sclerosis, intracranial mass/hemorrhage  Plan:   CBC, BMP, CT head, MRI.        ED RESULTS   Laboratory results:  Labs Reviewed   CBC WITH AUTO DIFFERENTIAL - Abnormal; Notable for the following components:       Result Value    WBC 12.3 (*)     Segs Absolute 9.4 (*)     All other components within normal limits   BASIC METABOLIC PANEL W/ REFLEX TO MG FOR LOW K - Abnormal; Notable for the following components:    Glucose 114 (*)     All other components within normal limits   ANION GAP   GLOMERULAR FILTRATION RATE, ESTIMATED       Radiologic studies results:  CT Head WO Contrast   Final Result   Impression:   No acute intracranial abnormality is identified.      This document has been electronically signed by: Edythe Clarity, MD on    04/30/2021 06:57 PM      All CTs at this facility use dose modulation techniques and iterative    reconstructions, and/or weight-based dosing   when appropriate to reduce radiation to a low as reasonably achievable.          ED Medications administered this visit:   Medications   droperidol (INAPSINE) injection 1.25 mg (1.25 mg IntraMUSCular Given 04/30/21 1859)   ketorolac (TORADOL) injection 30 mg (30 mg IntraVENous Given 04/30/21 1859)   0.9 % sodium chloride bolus (1,000 mLs IntraVENous New Bag 04/30/21 1903)   LORazepam (ATIVAN) injection 0.5 mg (0.5 mg IntraVENous Given 04/30/21 1923)         ED COURSE     ED Course as of 04/30/21 2005   Fri Apr 30, 2021   1930   Patient did not tolerate point-of-care ultrasound to look for retinal detachment stating that she was having sharp shooting pains in the back of her eye as soon as the probe touched the gel.  Pain patient did not have any pain to palpation of the eye with the hand immediately after  the gel was removed.  When asked to give the ultrasound a second attempt patient refused. [JB]   2001 Work-up overall unremarkable.  Patient was not able to tolerate the MRI despite receiving Ativan.  Patient is requesting to leave AMA.     [JB]   2002 In spite of multiple attempts by myself and staff to convince the patient to stay for further evaluation and treatment, we have unfortunately been unsuccessful. However, the patient has the capacity to give, receive, and withhold information. The patient does not appear intoxicated or responding to external stimuli. The patient verbalizes understanding of their condition and symptoms and that this represents a significant threat. Risks and benefits of staying for further evaluation and treatment or leaving AMA have been discussed and acknowledged by the patient. The patient has verbalized to me that they understand the plan of diagnosis and treatment, and unfortunately  will not agree and understand that it may cause worsening of their condition or death. The patient understands that they are welcome to return to the ED at any time for further care without prejudice and we will be happy to provide treatment again.  [JB]      ED Course User Index  [JB] Bartholomew Boards, MD       Strict return precautions and follow up instructions were discussed with the patient prior to discharge, with which the patient agrees.      MEDICATION CHANGES     New Prescriptions    No medications on file         FINAL DISPOSITION     Final diagnoses:   Vision changes     Condition: condition: stable  Dispo: Other Disposition: Left AMA      This transcription was electronically signed. Parts of this transcriptions may have been dictated by use of voice recognition software and electronically transcribed, and parts may have been transcribed with the assistance of an ED scribe. The transcription may contain errors not detected in proofreading.  Please refer to my supervising physician's  documentation if my documentation differs.    Electronically Signed: Grayland Ormond, MD, 04/30/21, 8:05 PM         Bartholomew Boards, MD  Resident  04/30/21 2005

## 2021-04-30 NOTE — Discharge Instructions (Addendum)
You are seen in the emergency department today with concern for visual changes.  We suspect you may have an optic neuritis or multiple sclerosis.  You are not able to tolerate the MRI machine today.  You are electing to leave AGAINST MEDICAL ADVICE as we are not able to rule out the severe conditions.  These may be debilitating including permanent neurological disability or loss of vision.  We have given you the information for a local neurologist if you change your mind regarding testing please give them a call and they can schedule you for an outpatient MRI.  Please return the emergency room if you have any worsening of your symptoms.  Just because you left AGAINST MEDICAL ADVICE does not mean we will not treat you in the future so please feel free to return if needed.  He may return to work tomorrow.

## 2021-04-30 NOTE — ED Triage Notes (Signed)
Presents to ER via [facility] with complaints of "seeing spots" while at work. Pt states she felt lightheaded while standing. States she "feels fine" and feels she does not need to be here. States she has a history of pulmonary hypertension so "you don't need to do my blood pressure standing." States she has no chest pain and feels a EKG is not necessary at this time. PT denies nay further needs. Will continue to monitor.

## 2021-04-30 NOTE — ED Notes (Signed)
ED nurse-to-nurse bedside report    Chief Complaint   Patient presents with   ??? Dizziness      LOC: alert and orientated to name, place, date  Vital signs   Vitals:    04/30/21 1744 04/30/21 1800 04/30/21 1910   BP: (!) 170/91 (!) 154/65    Pulse: 81  78   Resp: 17  16   Temp: 98 ??F (36.7 ??C)     TempSrc: Oral     SpO2: 99%  98%   Weight: 243 lb (110.2 kg)     Height: 5' (1.524 m)        Pain:  2  Pain Interventions: comfort  Pain Goal: 1  Oxygen: NA    Current needs required none   Telemetry: NA  LDAs:   Peripheral IV 04/30/21 Left Forearm (Active)   Site Assessment Clean, dry & intact 04/30/21 1856     Continuous Infusions:   Mobility: Independent  Morse Fall Risk Score: No flowsheet data found.  Fall Interventions: call light  Report given to: Zack RN     Sharika Mosquera [facility]) M Stewart Sasaki, RN  04/30/21 1911

## 2021-08-31 ENCOUNTER — Inpatient Hospital Stay
Admit: 2021-08-31 | Discharge: 2021-08-31 | Disposition: A | Discharge status: Home / Self Care | Attending: Emergency Medicine

## 2021-08-31 ENCOUNTER — Emergency Department: Admit: 2021-08-31 | Primary: Family

## 2021-08-31 DIAGNOSIS — R112 Nausea with vomiting, unspecified: Secondary | ICD-10-CM

## 2021-08-31 LAB — BASIC METABOLIC PANEL
Anion Gap: 13 mmol/L (ref 7–16)
BUN: 17 mg/dL (ref 6–20)
CO2: 21 mmol/L — ABNORMAL LOW (ref 22–29)
Calcium: 9.1 mg/dL (ref 8.6–10.2)
Chloride: 99 mmol/L (ref 98–107)
Creatinine: 0.6 mg/dL (ref 0.5–1.0)
Est, Glom Filt Rate: >60 mL/min/{1.73_m2} (ref >=60)
Glucose: 223 mg/dL — ABNORMAL HIGH (ref 74–99)
Potassium: 4.1 mmol/L (ref 3.5–5.0)
Sodium: 133 mmol/L (ref 132–146)

## 2021-08-31 LAB — CBC
Hematocrit: 40.6 % (ref 34.0–48.0)
Hemoglobin: 13.2 g/dL (ref 11.5–15.5)
MCH: 27.4 pg (ref 26.0–35.0)
MCHC: 32.5 % (ref 32.0–34.5)
MCV: 84.4 fL (ref 80.0–99.9)
MPV: 11.6 fL (ref 7.0–12.0)
Platelets: 287 E9/L (ref 130–450)
RBC: 4.81 E12/L (ref 3.50–5.50)
RDW: 13.9 fL (ref 11.5–15.0)
WBC: 11.9 E9/L — ABNORMAL HIGH (ref 4.5–11.5)

## 2021-08-31 LAB — EKG 12-LEAD
Atrial Rate: 90 {beats}/min
P Axis: 38 degrees
P-R Interval: 154 ms
Q-T Interval: 396 ms
QRS Duration: 94 ms
QTc Calculation (Bazett): 484 ms
R Axis: 13 degrees
T Axis: 160 degrees
Ventricular Rate: 90 {beats}/min

## 2021-08-31 LAB — TROPONIN: Troponin, High Sensitivity: 8 ng/L (ref 0–9)

## 2021-08-31 MED ORDER — ONDANSETRON HCL 4 MG/2ML IJ SOLN
4 MG/2ML | Freq: Once | INTRAMUSCULAR | Status: AC
Start: 2021-08-31 — End: 2021-08-31
  Administered 2021-08-31: 14:00:00 4 mg via INTRAVENOUS

## 2021-08-31 MED ORDER — SODIUM CHLORIDE 0.9 % IV BOLUS
0.9 % | Freq: Once | INTRAVENOUS | Status: AC
Start: 2021-08-31 — End: 2021-08-31
  Administered 2021-08-31: 14:00:00 1000 mL via INTRAVENOUS

## 2021-08-31 MED ORDER — PANTOPRAZOLE SODIUM 40 MG IV SOLR
40 MG | Freq: Once | INTRAVENOUS | Status: AC
Start: 2021-08-31 — End: 2021-08-31
  Administered 2021-08-31: 14:00:00 40 mg via INTRAVENOUS

## 2021-08-31 MED FILL — ONDANSETRON HCL 4 MG/2ML IJ SOLN: 4 MG/2ML | INTRAMUSCULAR | Qty: 2

## 2021-08-31 MED FILL — PROTONIX 40 MG IV SOLR: 40 mg | INTRAVENOUS | Qty: 40

## 2021-08-31 NOTE — ED Provider Notes (Signed)
HPI:  08/31/21,   Time: 8:20 AM EST         Melissa Cunningham is a 45 y.o. female presenting to the ED for an episode of lightheadedness with nausea and vomiting.  She was at work and suddenly became flushed and hot.  She states she went to the bathroom and had a bowel movement.  When she stood up she felt lightheaded and then became nauseated.  She had an episode of emesis and then had chest pain after the emesis.  This is why she presented to the emergency department.  Her chest pain is resolved.  She still mildly nauseated and mildly lightheaded.  She had just presented to work.  She states she is been eating and drinking normally.  Has a history of a myocardial infarction 2 years ago and uncertain why she developed heart disease.    ROS:   Pertinent positives and negatives are stated within HPI, all other systems reviewed and are negative.  --------------------------------------------- PAST HISTORY ---------------------------------------------  Past Medical History:  has a past medical history of Diabetes (HCC).    Past Surgical History:  has a past surgical history that includes Breast reduction surgery (Bilateral, 2001).    Social History:  reports that she has never smoked. Her smokeless tobacco use includes chew. She reports that she does not drink alcohol and does not use drugs.    Family History: family history is not on file.     The patient???s home medications have been reviewed.    Allergies: Lidocaine and Morphine    -------------------------------------------------- RESULTS -------------------------------------------------  All laboratory and radiology results have been personally reviewed by myself   LABS:  Results for orders placed or performed during the hospital encounter of 08/31/21   Basic metabolic panel   Result Value Ref Range    Sodium 133 132 - 146 mmol/L    Potassium 4.1 3.5 - 5.0 mmol/L    Chloride 99 98 - 107 mmol/L    CO2 21 (L) 22 - 29 mmol/L    Anion Gap 13 7 - 16 mmol/L    Glucose 223  (H) 74 - 99 mg/dL    BUN 17 6 - 20 mg/dL    Creatinine 0.6 0.5 - 1.0 mg/dL    Est, Glom Filt Rate >60 >=60 mL/min/1.73    Calcium 9.1 8.6 - 10.2 mg/dL   CBC   Result Value Ref Range    WBC 11.9 (H) 4.5 - 11.5 E9/L    RBC 4.81 3.50 - 5.50 E12/L    Hemoglobin 13.2 11.5 - 15.5 g/dL    Hematocrit 03.5 00.9 - 48.0 %    MCV 84.4 80.0 - 99.9 fL    MCH 27.4 26.0 - 35.0 pg    MCHC 32.5 32.0 - 34.5 %    RDW 13.9 11.5 - 15.0 fL    Platelets 287 130 - 450 E9/L    MPV 11.6 7.0 - 12.0 fL   Troponin   Result Value Ref Range    Troponin, High Sensitivity 8 0 - 9 ng/L   EKG 12 Lead   Result Value Ref Range    Ventricular Rate 90 BPM    Atrial Rate 90 BPM    P-R Interval 154 ms    QRS Duration 94 ms    Q-T Interval 396 ms    QTc Calculation (Bazett) 484 ms    P Axis 38 degrees    R Axis 13 degrees    T Axis 160 degrees  RADIOLOGY:  Interpreted by Radiologist.  XR CHEST PORTABLE   Final Result   No acute process.             ------------------------- NURSING NOTES AND VITALS REVIEWED ---------------------------   The nursing notes within the ED encounter and vital signs as below have been reviewed.   BP (!) 152/74    Pulse 92    Temp 98.2 ??F (36.8 ??C) (Oral)    Resp 16    Wt 243 lb (110.2 kg)    SpO2 96%    BMI 47.46 kg/m??   Oxygen Saturation Interpretation: Normal      ---------------------------------------------------PHYSICAL EXAM--------------------------------------      Constitutional/General: Alert and oriented x3, well appearing, non toxic in NAD  Head: NC/AT  Eyes: PERRL, EOMI  Nose:  Nares patent.  No congestion or discharge noted.  Ears:  TM's intact without erythema or perforation.  External canal without swelling  Mouth: Oropharynx clear, handling secretions, no trismus  Neck: Supple, full ROM, no meningeal signs  Pulmonary: Lungs Clear to auscultation bilaterally. No rales or rhonchi or wheezes noted. No retractions.  Cardiovascular:  Regular rate and rhythm, no murmurs, gallops, or rubs. 2+ symmetric distal pulses    Chest:  No tenderness, deformity or crepitus  Abdomen: Soft, non tender, non distended, normal bowel sounds  Back:  No tenderness to palpation on the cervical or thoracic or lumbar spine  Extremities: Moves all extremities x 4. Warm and well perfused  Skin: warm and dry without rash  Neurologic: GCS 15, no focal acute neurological deficit  Psych: Normal Affect      ------------------------------ ED COURSE/MEDICAL DECISION MAKING----------------------  Medications   0.9 % sodium chloride bolus (1,000 mLs IntraVENous New Bag 08/31/21 0841)   ondansetron (ZOFRAN) injection 4 mg (4 mg IntraVENous Given 08/31/21 0840)   pantoprazole (PROTONIX) injection 40 mg (40 mg IntraVENous Given 08/31/21 0841)     EKG:  Normal sinus rhythm with ventricular rate of 90.  PR interval, QRS duration and QT interval within normal range. Normal axis. No ST segment abnormalities to suggest acute ischemia.         Medical Decision Making:    Patient's symptoms fully resolved as of 9:38 AM.  I believe her symptoms were caused by a brief orthostatic episode.  She will be discharged home with instructions to aggressively hydrate herself and rest today.  Advised to return should her symptoms recur.  Patient advised to return to the emergency department should symptoms worsen. Advised to contact primary care physician to secure follow-up appointment within the next 1-2 days.        --------------------------------- IMPRESSION AND DISPOSITION ---------------------------------    IMPRESSION  1. Nausea and vomiting, unspecified vomiting type    2. Chest pain, unspecified type        DISPOSITION  Disposition: Discharge to home  Patient condition is good        Delano Metz, DO  08/31/21 [phone]
